# Patient Record
Sex: Female | Born: 1990 | Race: White | Hispanic: No | Marital: Married | Smoking: Former smoker
Health system: Southern US, Community
[De-identification: ages and names within clinical notes are randomized; demographics above are authoritative.]

## PROBLEM LIST (undated history)

## (undated) ENCOUNTER — Inpatient Hospital Stay (HOSPITAL_COMMUNITY): Payer: Self-pay

## (undated) DIAGNOSIS — B379 Candidiasis, unspecified: Secondary | ICD-10-CM

## (undated) DIAGNOSIS — F319 Bipolar disorder, unspecified: Secondary | ICD-10-CM

## (undated) DIAGNOSIS — Z87891 Personal history of nicotine dependence: Secondary | ICD-10-CM

## (undated) DIAGNOSIS — N812 Incomplete uterovaginal prolapse: Secondary | ICD-10-CM

## (undated) DIAGNOSIS — Z3492 Encounter for supervision of normal pregnancy, unspecified, second trimester: Principal | ICD-10-CM

## (undated) DIAGNOSIS — F329 Major depressive disorder, single episode, unspecified: Secondary | ICD-10-CM

## (undated) DIAGNOSIS — N898 Other specified noninflammatory disorders of vagina: Secondary | ICD-10-CM

## (undated) DIAGNOSIS — F419 Anxiety disorder, unspecified: Secondary | ICD-10-CM

## (undated) DIAGNOSIS — D649 Anemia, unspecified: Secondary | ICD-10-CM

## (undated) DIAGNOSIS — F429 Obsessive-compulsive disorder, unspecified: Secondary | ICD-10-CM

## (undated) DIAGNOSIS — F32A Depression, unspecified: Secondary | ICD-10-CM

## (undated) HISTORY — PX: OTHER SURGICAL HISTORY: SHX169

## (undated) HISTORY — PX: DILATION AND CURETTAGE OF UTERUS: SHX78

## (undated) HISTORY — PX: TUBAL LIGATION: SHX77

## (undated) HISTORY — DX: Candidiasis, unspecified: B37.9

## (undated) HISTORY — DX: Encounter for supervision of normal pregnancy, unspecified, second trimester: Z34.92

## (undated) HISTORY — DX: Other specified noninflammatory disorders of vagina: N89.8

## (undated) HISTORY — DX: Incomplete uterovaginal prolapse: N81.2

## (undated) HISTORY — DX: Bipolar disorder, unspecified: F31.9

---

## 2008-08-10 ENCOUNTER — Inpatient Hospital Stay (HOSPITAL_COMMUNITY): Admission: AD | Admit: 2008-08-10 | Discharge: 2008-08-10 | Payer: Self-pay | Admitting: Obstetrics and Gynecology

## 2008-08-12 ENCOUNTER — Inpatient Hospital Stay (HOSPITAL_COMMUNITY): Admission: AD | Admit: 2008-08-12 | Discharge: 2008-08-15 | Payer: Self-pay | Admitting: Obstetrics and Gynecology

## 2008-10-12 ENCOUNTER — Emergency Department (HOSPITAL_COMMUNITY): Admission: EM | Admit: 2008-10-12 | Discharge: 2008-10-12 | Payer: Self-pay | Admitting: Emergency Medicine

## 2008-11-10 ENCOUNTER — Emergency Department (HOSPITAL_COMMUNITY): Admission: EM | Admit: 2008-11-10 | Discharge: 2008-11-10 | Payer: Self-pay | Admitting: Emergency Medicine

## 2009-03-28 ENCOUNTER — Emergency Department (HOSPITAL_COMMUNITY): Admission: EM | Admit: 2009-03-28 | Discharge: 2009-03-28 | Payer: Self-pay | Admitting: Emergency Medicine

## 2010-05-07 ENCOUNTER — Emergency Department (HOSPITAL_COMMUNITY)
Admission: EM | Admit: 2010-05-07 | Discharge: 2010-05-07 | Payer: Self-pay | Source: Home / Self Care | Admitting: Emergency Medicine

## 2011-01-21 LAB — URINALYSIS, ROUTINE W REFLEX MICROSCOPIC
Bilirubin Urine: NEGATIVE
Glucose, UA: NEGATIVE mg/dL
Hgb urine dipstick: NEGATIVE
Ketones, ur: NEGATIVE mg/dL
Nitrite: NEGATIVE
Protein, ur: NEGATIVE mg/dL
Specific Gravity, Urine: 1.02 (ref 1.005–1.030)
Urobilinogen, UA: 0.2 mg/dL (ref 0.0–1.0)
pH: 7.5 (ref 5.0–8.0)

## 2011-01-21 LAB — WET PREP, GENITAL
Clue Cells Wet Prep HPF POC: NONE SEEN
Trich, Wet Prep: NONE SEEN
Yeast Wet Prep HPF POC: NONE SEEN

## 2011-01-21 LAB — POCT PREGNANCY, URINE: Preg Test, Ur: NEGATIVE

## 2011-01-21 LAB — GC/CHLAMYDIA PROBE AMP, GENITAL
Chlamydia, DNA Probe: NEGATIVE
GC Probe Amp, Genital: NEGATIVE

## 2011-02-13 LAB — URINE CULTURE: Colony Count: 90000

## 2011-02-13 LAB — URINALYSIS, ROUTINE W REFLEX MICROSCOPIC
Bilirubin Urine: NEGATIVE
Glucose, UA: NEGATIVE mg/dL
Ketones, ur: NEGATIVE mg/dL
Nitrite: NEGATIVE
Protein, ur: NEGATIVE mg/dL
Specific Gravity, Urine: 1.015 (ref 1.005–1.030)
Urobilinogen, UA: 0.2 mg/dL (ref 0.0–1.0)
pH: 6 (ref 5.0–8.0)

## 2011-02-13 LAB — PREGNANCY, URINE: Preg Test, Ur: NEGATIVE

## 2011-02-13 LAB — URINE MICROSCOPIC-ADD ON

## 2011-02-19 LAB — URINALYSIS, ROUTINE W REFLEX MICROSCOPIC
Bilirubin Urine: NEGATIVE
Glucose, UA: 100 mg/dL — AB
Ketones, ur: NEGATIVE mg/dL
Nitrite: POSITIVE — AB
Protein, ur: 30 mg/dL — AB
Specific Gravity, Urine: 1.025 (ref 1.005–1.030)
Urobilinogen, UA: 2 mg/dL — ABNORMAL HIGH (ref 0.0–1.0)
pH: 6 (ref 5.0–8.0)

## 2011-02-19 LAB — URINE MICROSCOPIC-ADD ON

## 2011-02-19 LAB — URINE CULTURE: Colony Count: >=100000

## 2011-03-28 ENCOUNTER — Emergency Department (HOSPITAL_COMMUNITY)
Admission: EM | Admit: 2011-03-28 | Discharge: 2011-03-28 | Disposition: A | Discharge status: Home / Self Care | Payer: Medicaid Other | Attending: Emergency Medicine | Admitting: Emergency Medicine

## 2011-03-28 DIAGNOSIS — O99891 Other specified diseases and conditions complicating pregnancy: Secondary | ICD-10-CM | POA: Insufficient documentation

## 2011-03-28 DIAGNOSIS — J45909 Unspecified asthma, uncomplicated: Secondary | ICD-10-CM | POA: Insufficient documentation

## 2011-03-28 DIAGNOSIS — IMO0001 Reserved for inherently not codable concepts without codable children: Secondary | ICD-10-CM | POA: Insufficient documentation

## 2011-03-28 DIAGNOSIS — Z79899 Other long term (current) drug therapy: Secondary | ICD-10-CM | POA: Insufficient documentation

## 2011-03-28 DIAGNOSIS — O99019 Anemia complicating pregnancy, unspecified trimester: Secondary | ICD-10-CM | POA: Insufficient documentation

## 2011-03-28 DIAGNOSIS — D649 Anemia, unspecified: Secondary | ICD-10-CM | POA: Insufficient documentation

## 2011-03-28 LAB — COMPREHENSIVE METABOLIC PANEL
ALT: 10 U/L (ref 0–35)
AST: 18 U/L (ref 0–37)
Albumin: 2.7 g/dL — ABNORMAL LOW (ref 3.5–5.2)
Alkaline Phosphatase: 149 U/L — ABNORMAL HIGH (ref 39–117)
BUN: <3 mg/dL — ABNORMAL LOW (ref 6–23)
CO2: 23 mEq/L (ref 19–32)
Calcium: 10.3 mg/dL (ref 8.4–10.5)
Chloride: 104 mEq/L (ref 96–112)
Creatinine, Ser: <0.47 mg/dL (ref 0.4–1.2)
Glucose, Bld: 83 mg/dL (ref 70–99)
Potassium: 3.5 mEq/L (ref 3.5–5.1)
Sodium: 136 mEq/L (ref 135–145)
Total Bilirubin: 0.2 mg/dL — ABNORMAL LOW (ref 0.3–1.2)
Total Protein: 6.4 g/dL (ref 6.0–8.3)

## 2011-03-28 LAB — DIFFERENTIAL
Basophils Absolute: 0 10*3/uL (ref 0.0–0.1)
Basophils Relative: 0 % (ref 0–1)
Eosinophils Absolute: 0.4 10*3/uL (ref 0.0–0.7)
Eosinophils Relative: 4 % (ref 0–5)
Lymphocytes Relative: 25 % (ref 12–46)
Lymphs Abs: 2.4 10*3/uL (ref 0.7–4.0)
Monocytes Absolute: 0.9 10*3/uL (ref 0.1–1.0)
Monocytes Relative: 9 % (ref 3–12)
Neutro Abs: 5.8 10*3/uL (ref 1.7–7.7)
Neutrophils Relative %: 61 % (ref 43–77)

## 2011-03-28 LAB — URINALYSIS, ROUTINE W REFLEX MICROSCOPIC
Bilirubin Urine: NEGATIVE
Glucose, UA: NEGATIVE mg/dL
Ketones, ur: NEGATIVE mg/dL
Leukocytes, UA: NEGATIVE
Nitrite: NEGATIVE
Protein, ur: NEGATIVE mg/dL
Specific Gravity, Urine: 1.01 (ref 1.005–1.030)
Urobilinogen, UA: 0.2 mg/dL (ref 0.0–1.0)
pH: 7 (ref 5.0–8.0)

## 2011-03-28 LAB — CBC
HCT: 30.2 % — ABNORMAL LOW (ref 36.0–46.0)
Hemoglobin: 9.5 g/dL — ABNORMAL LOW (ref 12.0–15.0)
MCH: 25 pg — ABNORMAL LOW (ref 26.0–34.0)
MCHC: 31.5 g/dL (ref 30.0–36.0)
MCV: 79.5 fL (ref 78.0–100.0)
Platelets: 212 10*3/uL (ref 150–400)
RBC: 3.8 MIL/uL — ABNORMAL LOW (ref 3.87–5.11)
RDW: 15.1 % (ref 11.5–15.5)
WBC: 9.6 10*3/uL (ref 4.0–10.5)

## 2011-03-28 LAB — CK: Total CK: 71 U/L (ref 7–177)

## 2011-03-28 LAB — URINE MICROSCOPIC-ADD ON

## 2011-04-15 ENCOUNTER — Inpatient Hospital Stay (HOSPITAL_COMMUNITY)
Admission: AD | Admit: 2011-04-15 | Discharge: 2011-04-15 | Disposition: A | Discharge status: Home / Self Care | Payer: Medicaid Other | Source: Ambulatory Visit | Attending: Family Medicine | Admitting: Family Medicine

## 2011-04-15 ENCOUNTER — Emergency Department (HOSPITAL_COMMUNITY)
Admission: EM | Admit: 2011-04-15 | Discharge: 2011-04-15 | Disposition: A | Discharge status: Other Acute Inpatient Hospital | Payer: Medicaid Other | Source: Home / Self Care | Attending: Emergency Medicine | Admitting: Emergency Medicine

## 2011-04-15 DIAGNOSIS — O479 False labor, unspecified: Secondary | ICD-10-CM

## 2011-04-15 LAB — WET PREP, GENITAL
Clue Cells Wet Prep HPF POC: NONE SEEN
Trich, Wet Prep: NONE SEEN
Yeast Wet Prep HPF POC: NONE SEEN

## 2011-05-26 ENCOUNTER — Emergency Department (HOSPITAL_COMMUNITY)
Admission: EM | Admit: 2011-05-26 | Discharge: 2011-05-26 | Discharge status: Against Medical Advice | Payer: Medicaid Other | Attending: Emergency Medicine | Admitting: Emergency Medicine

## 2011-05-26 ENCOUNTER — Encounter: Payer: Self-pay | Admitting: *Deleted

## 2011-05-26 DIAGNOSIS — N8110 Cystocele, unspecified: Secondary | ICD-10-CM | POA: Insufficient documentation

## 2011-05-26 DIAGNOSIS — O34599 Maternal care for other abnormalities of gravid uterus, unspecified trimester: Secondary | ICD-10-CM | POA: Insufficient documentation

## 2011-05-26 NOTE — ED Notes (Signed)
Pt c/o "cervix" hanging out of vagina; pt states she has had difficulty with the same situation during her pregnancy; pt is 5 weeks postpartum and is c/o pain to vaginal area

## 2011-05-26 NOTE — ED Notes (Signed)
Pt requesting to sign out lwbs,  States she will follow up with pmd or return if worse.

## 2011-08-07 LAB — CBC
HCT: 27.1 — ABNORMAL LOW
HCT: 30.2 — ABNORMAL LOW
Hemoglobin: 10 — ABNORMAL LOW
Hemoglobin: 8.7 — ABNORMAL LOW
MCHC: 32.2
MCHC: 33
MCV: 78.7
MCV: 79
Platelets: 259
Platelets: 279
RBC: 3.44 — ABNORMAL LOW
RBC: 3.83 — ABNORMAL LOW
RDW: 16.3 — ABNORMAL HIGH
RDW: 16.6 — ABNORMAL HIGH
WBC: 12.8 — ABNORMAL HIGH
WBC: 19.5 — ABNORMAL HIGH

## 2011-08-07 LAB — RPR: RPR Ser Ql: NONREACTIVE

## 2011-08-23 ENCOUNTER — Emergency Department (HOSPITAL_COMMUNITY)
Admission: EM | Admit: 2011-08-23 | Discharge: 2011-08-23 | Disposition: A | Discharge status: Home / Self Care | Payer: Self-pay | Attending: Emergency Medicine | Admitting: Emergency Medicine

## 2011-08-23 ENCOUNTER — Encounter (HOSPITAL_COMMUNITY): Payer: Self-pay | Admitting: Emergency Medicine

## 2011-08-23 DIAGNOSIS — M549 Dorsalgia, unspecified: Secondary | ICD-10-CM | POA: Insufficient documentation

## 2011-08-23 DIAGNOSIS — Z87891 Personal history of nicotine dependence: Secondary | ICD-10-CM | POA: Insufficient documentation

## 2011-08-23 DIAGNOSIS — R109 Unspecified abdominal pain: Secondary | ICD-10-CM | POA: Insufficient documentation

## 2011-08-23 DIAGNOSIS — K5900 Constipation, unspecified: Secondary | ICD-10-CM | POA: Insufficient documentation

## 2011-08-23 DIAGNOSIS — R10819 Abdominal tenderness, unspecified site: Secondary | ICD-10-CM | POA: Insufficient documentation

## 2011-08-23 DIAGNOSIS — J45909 Unspecified asthma, uncomplicated: Secondary | ICD-10-CM | POA: Insufficient documentation

## 2011-08-23 MED ORDER — OXYCODONE-ACETAMINOPHEN 5-325 MG PO TABS
1.0000 | ORAL_TABLET | Freq: Once | ORAL | Status: AC
  Administered 2011-08-23: 1 via ORAL
  Filled 2011-08-23: qty 1

## 2011-08-23 MED ORDER — DIAZEPAM 5 MG PO TABS
5.0000 mg | ORAL_TABLET | Freq: Once | ORAL | Status: AC
  Administered 2011-08-23: 5 mg via ORAL
  Filled 2011-08-23: qty 1

## 2011-08-23 MED ORDER — IBUPROFEN 400 MG PO TABS
400.0000 mg | ORAL_TABLET | Freq: Once | ORAL | Status: AC
  Administered 2011-08-23: 400 mg via ORAL
  Filled 2011-08-23: qty 1

## 2011-08-23 NOTE — ED Provider Notes (Signed)
History   This chart was scribed for Raeford Razor, MD by Clarita Crane. The patient was seen in room APA12/APA12 and the patient's care was started at 7:58AM.  CSN: [id number] Arrival date & time: 08/23/2011  7:52 AM   None     Chief Complaint  Patient presents with  . Back Pain   HPI Katie Briggs is a 20 y.o. female who presents to the Emergency Department complaining of constant moderate to severe, non-radaiting back pain described as aching and "twisting" onset this morning upon awaking and persistent since with associated mild abdominal pain and constipation. Patient unable to localize back pain and states "my entire back hurts" and notes that upper back is worse compared to lower. Reports back pain is aggravated with movement and breathing and not relieved with use of Flexeril. States she had 1 episode of similar back pain 1 week ago which resolved after taking Ibuprofen. Denies dysuria, hematuria. Patient denies h/o kidney stones but reports extensive h/o UTIs. Patient is a former smoker.  Past Medical History  Diagnosis Date  . Asthma     History reviewed. No pertinent past surgical history.  History reviewed. No pertinent family history.  History  Substance Use Topics  . Smoking status: Former Games developer  . Smokeless tobacco: Not on file  . Alcohol Use: No    OB History    Grav Para Term Preterm Abortions TAB SAB Ect Mult Living                  Review of Systems 10 Systems reviewed and are negative for acute change except as noted in the HPI.  Allergies  Review of patient's allergies indicates no known allergies.  Home Medications   Current Outpatient Rx  Name Route Sig Dispense Refill  . ALBUTEROL IN Inhalation Inhale into the lungs.      . IBUPROFEN 800 MG PO TABS Oral Take 800 mg by mouth every 8 (eight) hours as needed.        BP 122/53  Pulse 94  Temp(Src) 97.9 F (36.6 C) (Oral)  Resp 24  Ht 5' (1.524 m)  Wt 160 lb (72.576 kg)  BMI 31.25 kg/m2   SpO2 100%  LMP 07/08/2011  Physical Exam  Nursing note and vitals reviewed. Constitutional: She is oriented to person, place, and time. She appears well-developed and well-nourished.       Uncomfortable appearing. Tearful.   HENT:  Head: Normocephalic and atraumatic.  Eyes: Conjunctivae and EOM are normal. Pupils are equal, round, and reactive to light.  Neck: Neck supple. No tracheal deviation present.  Cardiovascular: Normal rate and regular rhythm.   No murmur heard. Pulmonary/Chest: Effort normal. No respiratory distress.  Abdominal: Soft. She exhibits no distension. There is tenderness (diffuse, mild). There is no rebound and no guarding.  Musculoskeletal: Normal range of motion. She exhibits no edema.       Entire spine non-tender.   Neurological: She is alert and oriented to person, place, and time. No cranial nerve deficit or sensory deficit.       Bilateral grip strength normal and equal. Distal sensation intact.    Skin: Skin is warm and dry.  Psychiatric: Her behavior is normal. Her mood appears anxious.    ED Course  Procedures (including critical care time)  DIAGNOSTIC STUDIES: Oxygen Saturation is 100% on room air, normal by my interpretation.    COORDINATION OF CARE:    Labs Reviewed - No data to display No results found.  1. Back pain       MDM  19yF with diffuse atraumatic back pain. Suspect musculoskeletal. Pain does not localize to particular anatomic region. Afebrile. Tearful and anxious, but not toxic. Neuro exam nonfocal. Plan symptomatic tx and re-assessment.      I personally preformed the services scribed in my presence. The recorded information has been reviewed and considered. Raeford Razor, MD.    Raeford Razor, MD 08/23/11 [phone]

## 2011-08-23 NOTE — ED Notes (Signed)
Pt states she woke up this am with her whole back hurting. Pt cannot generalize the pain. Pt denies any injury.

## 2011-08-23 NOTE — ED Notes (Signed)
meds given. Nad. Pt rating pain 10.

## 2011-11-17 ENCOUNTER — Encounter (HOSPITAL_COMMUNITY): Payer: Self-pay

## 2011-11-17 ENCOUNTER — Emergency Department (HOSPITAL_COMMUNITY)
Admission: EM | Admit: 2011-11-17 | Discharge: 2011-11-17 | Disposition: A | Discharge status: Home / Self Care | Payer: Self-pay | Attending: Emergency Medicine | Admitting: Emergency Medicine

## 2011-11-17 DIAGNOSIS — R3915 Urgency of urination: Secondary | ICD-10-CM | POA: Insufficient documentation

## 2011-11-17 DIAGNOSIS — R109 Unspecified abdominal pain: Secondary | ICD-10-CM | POA: Insufficient documentation

## 2011-11-17 DIAGNOSIS — R319 Hematuria, unspecified: Secondary | ICD-10-CM | POA: Insufficient documentation

## 2011-11-17 DIAGNOSIS — N898 Other specified noninflammatory disorders of vagina: Secondary | ICD-10-CM | POA: Insufficient documentation

## 2011-11-17 DIAGNOSIS — J45909 Unspecified asthma, uncomplicated: Secondary | ICD-10-CM | POA: Insufficient documentation

## 2011-11-17 DIAGNOSIS — N390 Urinary tract infection, site not specified: Secondary | ICD-10-CM | POA: Insufficient documentation

## 2011-11-17 DIAGNOSIS — R350 Frequency of micturition: Secondary | ICD-10-CM | POA: Insufficient documentation

## 2011-11-17 DIAGNOSIS — R300 Dysuria: Secondary | ICD-10-CM | POA: Insufficient documentation

## 2011-11-17 LAB — URINALYSIS, ROUTINE W REFLEX MICROSCOPIC
Glucose, UA: 100 mg/dL — AB
Nitrite: POSITIVE — AB
Protein, ur: >300 mg/dL — AB
Specific Gravity, Urine: 1.025 (ref 1.005–1.030)
Urobilinogen, UA: 1 mg/dL (ref 0.0–1.0)
pH: 5.5 (ref 5.0–8.0)

## 2011-11-17 LAB — PREGNANCY, URINE: Preg Test, Ur: NEGATIVE

## 2011-11-17 LAB — URINE MICROSCOPIC-ADD ON

## 2011-11-17 MED ORDER — NITROFURANTOIN MONOHYD MACRO 100 MG PO CAPS: 100.0000 mg | ORAL_CAPSULE | Freq: Two times a day (BID) | ORAL | Status: AC

## 2011-11-17 MED ORDER — PHENAZOPYRIDINE HCL 100 MG PO TABS
200.0000 mg | ORAL_TABLET | Freq: Once | ORAL | Status: AC
  Administered 2011-11-17: 200 mg via ORAL
  Filled 2011-11-17 (×2): qty 1

## 2011-11-17 MED ORDER — FLUCONAZOLE 200 MG PO TABS: 200.0000 mg | ORAL_TABLET | Freq: Every day | ORAL | Status: AC

## 2011-11-17 MED ORDER — PHENAZOPYRIDINE HCL 200 MG PO TABS: 200.0000 mg | ORAL_TABLET | Freq: Three times a day (TID) | ORAL | Status: AC

## 2011-11-17 MED ORDER — NITROFURANTOIN MONOHYD MACRO 100 MG PO CAPS
100.0000 mg | ORAL_CAPSULE | Freq: Once | ORAL | Status: AC
  Administered 2011-11-17: 100 mg via ORAL
  Filled 2011-11-17: qty 1

## 2011-11-17 NOTE — ED Notes (Signed)
Waiting for pharmacy to bring antibiotic

## 2011-11-17 NOTE — ED Provider Notes (Signed)
Medical screening examination/treatment/procedure(s) were conducted as a shared visit with non-physician practitioner(s) and myself.  I personally evaluated the patient during the encounter.  No clinical evidence of pyelonephritis  Donnetta Hutching, MD 11/17/11 1523

## 2011-11-17 NOTE — ED Notes (Signed)
Pt reports woke up this morning with lower abd pain, burning with urination, hematuria, urinary frequency.

## 2011-11-17 NOTE — ED Provider Notes (Signed)
History     CSN: [id number]  Arrival date & time 11/17/11  0820   First MD Initiated Contact with Patient 11/17/11 0827      Chief Complaint  Patient presents with  . Hematuria  . Urinary Tract Infection    (Consider location/radiation/quality/duration/timing/severity/associated sxs/prior treatment) Patient is a 21 y.o. female presenting with hematuria and urinary tract infection. The history is provided by the patient. No language interpreter was used.  Hematuria This is a new problem. The current episode started today. The problem is unchanged. She describes the hematuria as gross hematuria. The hematuria occurs throughout @his @ entire urinary stream.  She reports no clotting in her urine stream. The pain is moderate. She describes her urine color as light pink. Irritative symptoms include frequency and urgency. Associated symptoms include abdominal pain and dysuria. Pertinent negatives include no fever, nausea or vomiting. (Hematuria ) Her past medical history is significant for recent infection. There is no history of kidney stones.  Urinary Tract Infection Associated symptoms include abdominal pain. Pertinent negatives include no fever, nausea or vomiting.    Past Medical History  Diagnosis Date  . Asthma     History reviewed. No pertinent past surgical history.  No family history on file.  History  Substance Use Topics  . Smoking status: Former Games developer  . Smokeless tobacco: Not on file  . Alcohol Use: No    OB History    Grav Para Term Preterm Abortions TAB SAB Ect Mult Living                  Review of Systems  Constitutional: Negative for fever.  Gastrointestinal: Positive for abdominal pain. Negative for nausea and vomiting.  Genitourinary: Positive for dysuria, urgency, frequency and hematuria.  All other systems reviewed and are negative.    Allergies  Review of patient's allergies indicates no known allergies.  Home Medications   Current  Outpatient Rx  Name Route Sig Dispense Refill  . ALBUTEROL SULFATE HFA 108 (90 BASE) MCG/ACT IN AERS Inhalation Inhale 2 puffs into the lungs every 6 (six) hours as needed. Wheezing     . ALBUTEROL IN Inhalation Inhale into the lungs.      . ETONOGESTREL 68 MG [place] IMPL Subcutaneous Inject into the skin.      . IBUPROFEN 800 MG PO TABS Oral Take 800 mg by mouth every 8 (eight) hours as needed.        BP 108/74  Pulse 86  Temp(Src) 98.4 F (36.9 C) (Oral)  Resp 18  Ht 5' (1.524 m)  Wt 155 lb (70.308 kg)  BMI 30.27 kg/m2  SpO2 97%  Physical Exam  Nursing note and vitals reviewed. Constitutional: She is oriented to person, place, and time. She appears well-developed and well-nourished. No distress.  HENT:  Head: Normocephalic and atraumatic.  Eyes: EOM are normal.  Neck: Normal range of motion.  Cardiovascular: Normal rate, regular rhythm and normal heart sounds.   Pulmonary/Chest: Effort normal and breath sounds normal.  Abdominal: Soft. Bowel sounds are normal. She exhibits no distension and no mass. There is tenderness. There is no rebound and no guarding.  Genitourinary: Vaginal discharge found.  Musculoskeletal: Normal range of motion.  Neurological: She is alert and oriented to person, place, and time. No cranial nerve deficit. Coordination normal.  Skin: Skin is warm and dry.  Psychiatric: She has a normal mood and affect. Judgment normal.    ED Course  Procedures (including critical care time)   Labs Reviewed  URINALYSIS, ROUTINE W REFLEX MICROSCOPIC  PREGNANCY, URINE   No results found.   No diagnosis found.    MDM          Worthy Rancher, PA 11/17/11 [phone]

## 2011-11-19 LAB — URINE CULTURE
Colony Count: >=100000
Culture  Setup Time: 201301122037

## 2011-11-21 NOTE — ED Notes (Signed)
+   urine Patient treated with macrobid-sensitive to same-chart appended per protocol MD. 

## 2011-12-17 ENCOUNTER — Emergency Department (HOSPITAL_COMMUNITY)
Admission: EM | Admit: 2011-12-17 | Discharge: 2011-12-17 | Disposition: A | Discharge status: Home / Self Care | Payer: Self-pay | Attending: Emergency Medicine | Admitting: Emergency Medicine

## 2011-12-17 ENCOUNTER — Encounter (HOSPITAL_COMMUNITY): Payer: Self-pay | Admitting: Emergency Medicine

## 2011-12-17 ENCOUNTER — Emergency Department (HOSPITAL_COMMUNITY): Payer: Self-pay

## 2011-12-17 DIAGNOSIS — S93409A Sprain of unspecified ligament of unspecified ankle, initial encounter: Secondary | ICD-10-CM | POA: Insufficient documentation

## 2011-12-17 DIAGNOSIS — J45909 Unspecified asthma, uncomplicated: Secondary | ICD-10-CM | POA: Insufficient documentation

## 2011-12-17 DIAGNOSIS — Z87891 Personal history of nicotine dependence: Secondary | ICD-10-CM | POA: Insufficient documentation

## 2011-12-17 DIAGNOSIS — W108XXA Fall (on) (from) other stairs and steps, initial encounter: Secondary | ICD-10-CM | POA: Insufficient documentation

## 2011-12-17 MED ORDER — IBUPROFEN 800 MG PO TABS: 800.0000 mg | ORAL_TABLET | Freq: Three times a day (TID) | ORAL | Status: AC

## 2011-12-17 MED ORDER — IBUPROFEN 800 MG PO TABS
800.0000 mg | ORAL_TABLET | Freq: Once | ORAL | Status: AC
  Administered 2011-12-17: 800 mg via ORAL
  Filled 2011-12-17: qty 1

## 2011-12-17 MED ORDER — HYDROCODONE-ACETAMINOPHEN 5-325 MG PO TABS: ORAL_TABLET | ORAL | Status: AC

## 2011-12-17 MED ORDER — HYDROCODONE-ACETAMINOPHEN 5-325 MG PO TABS
1.0000 | ORAL_TABLET | Freq: Once | ORAL | Status: AC
  Administered 2011-12-17: 1 via ORAL
  Filled 2011-12-17: qty 1

## 2011-12-17 NOTE — ED Notes (Signed)
Patient states she fell down the stairs and twisted right ankle.

## 2011-12-17 NOTE — Discharge Instructions (Signed)
Ankle Sprain °An ankle sprain is an injury to the ligaments that hold the ankle joint together.  °CAUSES °The injury is usually caused by a fall or by twisting the ankle. It is important to tell your caregiver how the injury occurred and whether or not you were able to walk immediately after the injury.  °SYMPTOMS  °Pain is the primary symptom. It may be present at rest or only when you are trying to stand or walk. The ankle will likely be swollen. Bruising may develop immediately or after 1 or 2 days. It may be difficult or impossible to stand or walk. This depends on the severity of the sprain. °DIAGNOSIS  °Your caregiver can determine if a sprain has occurred based on the accident details and on examination of your ankle. Examination will include pressing and squeezing areas of the foot and ankle. Your caregiver will try to move the ankle in certain ways. X-rays may be used to be sure a bone was not broken, or that the ligament did not pull off of a bone (avulsion). There are standard guidelines that can reliably determine if an X-ray is needed. °TREATMENT  °Rest, ice, elevation, and compression are the basic modes of treatment. Certain types of braces can help stabilize the ankle and allow early return to walking. Your caregiver can make a recommendation for this. Medication may be recommended for pain. You may be referred to an orthopedist or a physical therapist for certain types of severe sprains. °HOME CARE INSTRUCTIONS  °· Apply ice to the sore area for 15 to 20 minutes, 3 to 4 times per day. Do this while you are awake for the first 2 days, or as directed. This can be stopped when the swelling goes away. Put the ice in a plastic bag and place a towel between the bag of ice and your skin.  °· Keep your leg elevated when possible to lessen swelling.  °· If your caregiver recommends crutches, use them as instructed with a non-weight bearing cast for 1 week. Then, you may walk on your ankle as the pain allows,  or as instructed. Gradually, put weight on the affected ankle. Continue to use crutches or a cane until you can walk without causing pain.  °· If a plaster splint was applied, wear the splint until you are seen for a follow-up examination. Rest it on nothing harder than a pillow the first 24 hours. Do not put weight on it. Do not get it wet. You may take it off to take a shower or bath.  °· You may have been given an elastic bandage to use with the plaster splint, or you may have been given a elastic bandage to use alone. The elastic bandage is too tight if you have numbness, tingling, or if your foot becomes cold and blue. Adjust the bandage to make it comfortable.  °· If an air splint was applied, you may blow more air into it or take some out to make it more comfortable. You may take it off at night and to take a shower or bath. Wiggle your toes in the splint several times per day if you are able.  °· Only take over-the-counter or prescription medicines for pain, discomfort, or fever as directed by your caregiver.  °· Do not drive a vehicle until your caregiver specifically tells you it is safe to do so.  °SEEK MEDICAL CARE IF:  °· You have an increase in bruising, swelling, or pain.  °· Your   toes feel cold.  °· Pain relief is not achieved with medications.  °SEEK IMMEDIATE MEDICAL CARE IF: °Your toes are numb or blue or you have severe pain. °MAKE SURE YOU:  °· Understand these instructions.  °· Will watch your condition.  °· Will get help right away if you are not doing well or get worse.  °Document Released: 10/22/2005 Document Revised: 01/26/2011 Document Reviewed: 05/26/2008 °[employer]® Patient Information ©2012 [employer]. °

## 2011-12-17 NOTE — ED Notes (Signed)
Pain ankle and foot.  Fell down steps

## 2011-12-17 NOTE — ED Provider Notes (Signed)
History     CSN: [id number]  Arrival date & time 12/17/11  2113   First MD Initiated Contact with Patient 12/17/11 2127      Chief Complaint  Patient presents with  . Ankle Injury    (Consider location/radiation/quality/duration/timing/severity/associated sxs/prior treatment) HPI Comments: Patient complains of right ankle and foot pain after falling down 4 steps this evening.  She states her ankle twisted. She denies other injuries, neck pain, back pain, or LOC.  She states pain is worse with weightbearing and movement and improves with rest. She states she has not applied ice or taken any medication for pain  Patient is a 21 y.o. female presenting with lower extremity injury. The history is provided by the patient. No language interpreter was used.  Ankle Injury This is a new problem. The current episode started today. The problem occurs constantly. The problem has been unchanged. Associated symptoms include arthralgias. Pertinent negatives include no fever, headaches, joint swelling, neck pain, numbness, rash, vomiting or weakness. The symptoms are aggravated by standing, twisting and walking. She has tried nothing for the symptoms. The treatment provided no relief.    Past Medical History  Diagnosis Date  . Asthma     History reviewed. No pertinent past surgical history.  History reviewed. No pertinent family history.  History  Substance Use Topics  . Smoking status: Former Games developer  . Smokeless tobacco: Not on file  . Alcohol Use: Yes     occ    OB History    Grav Para Term Preterm Abortions TAB SAB Ect Mult Living                  Review of Systems  Constitutional: Negative for fever.  HENT: Negative for neck pain.   Gastrointestinal: Negative for vomiting.  Musculoskeletal: Positive for arthralgias. Negative for joint swelling.  Skin: Negative for rash.  Neurological: Negative for weakness, numbness and headaches.    Allergies  Review of patient's allergies  indicates no known allergies.  Home Medications   Current Outpatient Rx  Name Route Sig Dispense Refill  . ALBUTEROL SULFATE HFA 108 (90 BASE) MCG/ACT IN AERS Inhalation Inhale 2 puffs into the lungs every 6 (six) hours as needed. Wheezing     . ETONOGESTREL 68 MG [place] IMPL Subcutaneous Inject into the skin.        BP 107/93  Pulse 113  Temp(Src) 98.8 F (37.1 C) (Oral)  Resp 20  Ht 5' (1.524 m)  Wt 155 lb (70.308 kg)  BMI 30.27 kg/m2  SpO2 100%  Physical Exam  Nursing note and vitals reviewed. Constitutional: She is oriented to person, place, and time. She appears well-developed and well-nourished. No distress.  HENT:  Head: Normocephalic and atraumatic.  Neck: Normal range of motion. Neck supple.  Cardiovascular: Normal rate, regular rhythm and normal heart sounds.   Pulmonary/Chest: Effort normal and breath sounds normal. No respiratory distress.  Musculoskeletal: Normal range of motion. She exhibits tenderness. She exhibits no edema.       Right ankle: She exhibits normal range of motion, no swelling, no ecchymosis, no deformity, no laceration and normal pulse. tenderness. Lateral malleolus and head of 5th metatarsal tenderness found. No medial malleolus and no proximal fibula tenderness found. Achilles tendon normal.       Feet:  Lymphadenopathy:    She has no cervical adenopathy.  Neurological: She is alert and oriented to person, place, and time. She exhibits normal muscle tone. Coordination normal.  Skin: Skin is warm  and dry.    ED Course  Procedures (including critical care time)  Labs Reviewed - No data to display Dg Ankle Complete Right  12/17/2011  *RADIOLOGY REPORT*  Clinical Data: The ankle injury.  Fall down steps.  Pain.  RIGHT ANKLE - COMPLETE 3+ VIEW  Comparison: None.  Findings: The ankle is located.  No acute bone or soft tissue abnormality is present.  IMPRESSION: Negative right ankle.  Original Report Authenticated By: Jamesetta Orleans. MATTERN, M.D.    Dg Foot Complete Right  12/17/2011  *RADIOLOGY REPORT*  Clinical Data: Fall down steps.  Pain.  RIGHT FOOT COMPLETE - 3+ VIEW  Comparison: None.  Findings: No acute osseous abnormalities are present.  There is a focal calcific density within the soft tissues along the plantar surface of the foot.  The soft tissues are otherwise unremarkable.  IMPRESSION:  1.  No acute fracture. 2.  Calcific density along the plantar surface the foot.  This could represent a chronic foreign body.  Original Report Authenticated By: Jamesetta Orleans. MATTERN, M.D.     ASO and crutches were applied by nursing staff. Pain improved. Patient tolerated procedure well. Remains neurovascularly intact   MDM     Tenderness to palpation of the right lateral ankle without significant soft tissue swelling or bruising.  No proximal fibular tenderness.  DP pulse brisk, sensation intact, cap refill less than 2 seconds.  I will apply ASO splint, dispense crutches the patient agrees to close followup with orthopedics.       Sanyiah Kanzler L. Denis Koppel, [place] 12/18/11 0128

## 2011-12-18 NOTE — ED Provider Notes (Signed)
Medical screening examination/treatment/procedure(s) were performed by non-physician practitioner and as supervising physician I was immediately available for consultation/collaboration.  Nicoletta Dress. Colon Branch, MD 12/18/11 1652

## 2012-03-05 ENCOUNTER — Encounter (HOSPITAL_COMMUNITY): Payer: Self-pay | Admitting: Emergency Medicine

## 2012-03-05 ENCOUNTER — Emergency Department (HOSPITAL_COMMUNITY)
Admission: EM | Admit: 2012-03-05 | Discharge: 2012-03-06 | Disposition: A | Discharge status: Other Acute Inpatient Hospital | Payer: Self-pay | Attending: Emergency Medicine | Admitting: Emergency Medicine

## 2012-03-05 DIAGNOSIS — R45851 Suicidal ideations: Secondary | ICD-10-CM | POA: Insufficient documentation

## 2012-03-05 LAB — COMPREHENSIVE METABOLIC PANEL
ALT: 21 U/L (ref 0–35)
AST: 23 U/L (ref 0–37)
Albumin: 3.5 g/dL (ref 3.5–5.2)
Alkaline Phosphatase: 86 U/L (ref 39–117)
BUN: 9 mg/dL (ref 6–23)
CO2: 27 mEq/L (ref 19–32)
Calcium: 8.9 mg/dL (ref 8.4–10.5)
Chloride: 102 mEq/L (ref 96–112)
Creatinine, Ser: 0.68 mg/dL (ref 0.50–1.10)
GFR calc Af Amer: >90 mL/min (ref >90)
GFR calc non Af Amer: >90 mL/min (ref >90)
Glucose, Bld: 81 mg/dL (ref 70–99)
Potassium: 3.4 mEq/L — ABNORMAL LOW (ref 3.5–5.1)
Sodium: 139 mEq/L (ref 135–145)
Total Bilirubin: 0.3 mg/dL (ref 0.3–1.2)
Total Protein: 6.8 g/dL (ref 6.0–8.3)

## 2012-03-05 LAB — POCT PREGNANCY, URINE: Preg Test, Ur: NEGATIVE

## 2012-03-05 LAB — CBC
HCT: 39.3 % (ref 36.0–46.0)
Hemoglobin: 13.4 g/dL (ref 12.0–15.0)
MCH: 29.5 pg (ref 26.0–34.0)
MCHC: 34.1 g/dL (ref 30.0–36.0)
MCV: 86.4 fL (ref 78.0–100.0)
Platelets: 254 10*3/uL (ref 150–400)
RBC: 4.55 MIL/uL (ref 3.87–5.11)
RDW: 13.7 % (ref 11.5–15.5)
WBC: 12.1 10*3/uL — ABNORMAL HIGH (ref 4.0–10.5)

## 2012-03-05 LAB — RAPID URINE DRUG SCREEN, HOSP PERFORMED
Amphetamines: NOT DETECTED
Barbiturates: NOT DETECTED
Benzodiazepines: NOT DETECTED
Cocaine: NOT DETECTED
Opiates: NOT DETECTED
Tetrahydrocannabinol: NOT DETECTED

## 2012-03-05 LAB — ACETAMINOPHEN LEVEL: Acetaminophen (Tylenol), Serum: <15 ug/mL (ref 10–30)

## 2012-03-05 LAB — ETHANOL: Alcohol, Ethyl (B): <11 mg/dL (ref 0–11)
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
## 2012-03-05 NOTE — ED Notes (Signed)
Dinner tray delivered.
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
## 2012-03-05 NOTE — ED Notes (Signed)
Act team at bedside- Meal tray provided.  SItter remains at door.
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
## 2012-03-05 NOTE — ED Notes (Signed)
Dinner tray ordered, reg nonsharp 

## 2012-03-06 ENCOUNTER — Encounter (HOSPITAL_COMMUNITY): Payer: Self-pay

## 2012-03-06 ENCOUNTER — Inpatient Hospital Stay (HOSPITAL_COMMUNITY)
Admission: AD | Admit: 2012-03-06 | Discharge: 2012-03-10 | DRG: 885 | Disposition: A | Discharge status: Home / Self Care | Payer: 59 | Source: Ambulatory Visit | Attending: Psychiatry | Admitting: Psychiatry

## 2012-03-06 DIAGNOSIS — F429 Obsessive-compulsive disorder, unspecified: Secondary | ICD-10-CM | POA: Diagnosis present

## 2012-03-06 DIAGNOSIS — J45909 Unspecified asthma, uncomplicated: Secondary | ICD-10-CM | POA: Diagnosis present

## 2012-03-06 DIAGNOSIS — F3132 Bipolar disorder, current episode depressed, moderate: Secondary | ICD-10-CM

## 2012-03-06 DIAGNOSIS — F314 Bipolar disorder, current episode depressed, severe, without psychotic features: Principal | ICD-10-CM | POA: Diagnosis present

## 2012-03-06 DIAGNOSIS — R45851 Suicidal ideations: Secondary | ICD-10-CM

## 2012-03-06 HISTORY — DX: Major depressive disorder, single episode, unspecified: F32.9

## 2012-03-06 HISTORY — DX: Depression, unspecified: F32.A

## 2012-03-06 MED ORDER — TRAZODONE HCL 50 MG PO TABS
50.0000 mg | ORAL_TABLET | Freq: Every evening | ORAL | Status: DC | PRN
  Administered 2012-03-06 – 2012-03-09 (×7): 50 mg via ORAL
  Filled 2012-03-06 (×12): qty 1

## 2012-03-06 MED ORDER — ACETAMINOPHEN 325 MG PO TABS
650.0000 mg | ORAL_TABLET | Freq: Four times a day (QID) | ORAL | Status: DC | PRN
  Administered 2012-03-10 (×2): 650 mg via ORAL

## 2012-03-06 MED ORDER — SERTRALINE HCL 50 MG PO TABS
75.0000 mg | ORAL_TABLET | Freq: Every day | ORAL | Status: DC
  Administered 2012-03-06 – 2012-03-10 (×5): 75 mg via ORAL
  Filled 2012-03-06 (×6): qty 1

## 2012-03-06 MED ORDER — MAGNESIUM HYDROXIDE 400 MG/5ML PO SUSP: 30.0000 mL | Freq: Every day | ORAL | Status: DC | PRN

## 2012-03-06 MED ORDER — NICOTINE 21 MG/24HR TD PT24
21.0000 mg | MEDICATED_PATCH | Freq: Every day | TRANSDERMAL | Status: DC
  Filled 2012-03-06: qty 1

## 2012-03-06 MED ORDER — POTASSIUM CHLORIDE CRYS ER 10 MEQ PO TBCR
20.0000 meq | EXTENDED_RELEASE_TABLET | Freq: Two times a day (BID) | ORAL | Status: AC
  Administered 2012-03-06 – 2012-03-08 (×6): 20 meq via ORAL
  Filled 2012-03-06 (×4): qty 1
  Filled 2012-03-06: qty 2
  Filled 2012-03-06: qty 1

## 2012-03-06 MED ORDER — ALUM & MAG HYDROXIDE-SIMETH 200-200-20 MG/5ML PO SUSP: 30.0000 mL | ORAL | Status: DC | PRN

## 2012-03-06 MED ORDER — DIVALPROEX SODIUM ER 500 MG PO TB24
500.0000 mg | ORAL_TABLET | Freq: Two times a day (BID) | ORAL | Status: DC
  Administered 2012-03-06 – 2012-03-07 (×3): 500 mg via ORAL
  Filled 2012-03-06 (×5): qty 1

## 2012-03-06 NOTE — Progress Notes (Signed)
[facility] Group Notes:  (Counselor/Nursing/MHT/Case Management/Adjunct)  03/06/2012 2:35 PM  Type of Therapy:  Group Therapy at 11:00 and 1:15  Participation Level:  Did Not Attend   Katie Briggs 03/06/2012, 2:35 PM

## 2012-03-06 NOTE — Tx Team (Signed)
Initial Interdisciplinary Treatment Plan  PATIENT STRENGTHS: (choose at least two) Ability for insight Average or above average intelligence Capable of independent living Communication skills Financial means Motivation for treatment/growth Physical Health  PATIENT STRESSORS: Loss of son to CPS today* Marital or family conflict Medication change or noncompliance   PROBLEM LIST: Problem List/Patient Goals Date to be addressed Date deferred Reason deferred Estimated date of resolution  Depression/SI 5/2                                                      DISCHARGE CRITERIA:  Ability to meet basic life and health needs Adequate post-discharge living arrangements Improved stabilization in mood, thinking, and/or behavior Verbal commitment to aftercare and medication compliance  PRELIMINARY DISCHARGE PLAN: Outpatient therapy Return to previous living arrangement Return to previous work or school arrangements  PATIENT/FAMIILY INVOLVEMENT: This treatment plan has been presented to and reviewed with the patient, Katie Briggs, and/or family member, .  The patient and family have been given the opportunity to ask questions and make suggestions.  Manuela Schwartz [place] [facility] 03/06/2012, 1:50 AM

## 2012-03-06 NOTE — Progress Notes (Addendum)
Patient ID: Katie Briggs, female   DOB: 1991-08-15, 21 y.o.   MRN: [id number] She has been up for short periods of time. To talk on phone and to eat. Remains in bed remainder of time.  Stated that she was sleepy.  She reports thought of SI  Off and on on her self inventory sheet. Stated that she would not harm herself here.Self inventory: depression 8, hopeless 7.  Reports no physical pain.
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
## 2012-03-06 NOTE — Progress Notes (Signed)
Approached patient twice today to do PSA III; both times unable to rouse patient from sleep.  Did observe patient in line to eat lunch and spoke with her briefly.  In treatment team meeting today there was plan to move patient to [address] hall today once bed becomes available.  Clide Dales 03/06/2012 2:38 PM

## 2012-03-07 DIAGNOSIS — F314 Bipolar disorder, current episode depressed, severe, without psychotic features: Principal | ICD-10-CM

## 2012-03-07 MED ORDER — LITHIUM CARBONATE 300 MG PO CAPS
300.0000 mg | ORAL_CAPSULE | Freq: Two times a day (BID) | ORAL | Status: DC
  Administered 2012-03-07 – 2012-03-10 (×7): 300 mg via ORAL
  Filled 2012-03-07 (×10): qty 1

## 2012-03-07 NOTE — Progress Notes (Signed)
[facility] Group Notes: (Counselor/Nursing/MHT/Case Management/Adjunct) 03/07/2012   @11 :00am Preventing Relapse  Type of Therapy:  Group Therapy  Participation Level:  Active  Participation Quality:  Attentive, Sharing, Appropriate  Affect:  Appropriate  Cognitive:  Appropriate  Insight:  Good  Engagement in Group: Good  Engagement in Therapy:  Good  Modes of Intervention:  Support and Exploration  Summary of Progress/Problems: Katie Briggs processed how serving in the community can be good for recovery. She stated that anything that gets her mind off her own problems and allows her to help others adds meaning to her life. Also explored how being a part of the community fosters connections that can help her not to isolate.  Katie Briggs 03/07/2012 4:09 PM     [facility] Group Notes: (Counselor/Nursing/MHT/Case Management/Adjunct) 03/07/2012   @1 :15pm  Type of Therapy:  Group Therapy  Participation Level:  Good  Participation Quality:  Good  Affect:  Appropriate  Cognitive:  Appropriate  Insight:  Good  Engagement in Group:  Good  Engagement in Therapy:  Good  Modes of Intervention:  Support and Exploration  Summary of Progress/Problems:   Katie Briggs  participated with speaker from [facility] [employer] [facility] [place] and expressed interest in programs [facility] offers. She related to the speaker in many areas and shared her own experience of generational depression. Katie Briggs processed her own upbringing and how it has impacted her present life, and asked questions of the speaker about the causes of mental health concerns.    Katie Briggs 03/07/2012 4:16 PM

## 2012-03-07 NOTE — Tx Team (Signed)
Interdisciplinary Treatment Plan Update (Adult)  Date:  03/07/2012  Time Reviewed:  11:15 AM   Progress in Treatment: Attending groups: Yes Participating in groups:  Yes Taking medication as prescribed: Yes Tolerating medication:  Yes Family/Significant other contact made: No, counselor assessing for appropriate contact  Patient understands diagnosis:  Yes Discussing patient identified problems/goals with staff:  Yes Medical problems stabilized or resolved:  Yes Denies suicidal/homicidal ideation: Yes Issues/concerns per patient self-inventory:  None identified Other: N/A  New problem(s) identified: None Identified  Reason for Continuation of Hospitalization: Anxiety Depression Medication stabilization  Interventions implemented related to continuation of hospitalization: mood stabilization, medication monitoring and adjustment, group therapy and psycho education, safety checks q 15 mins  Additional comments: N/A  Estimated length of stay: 2-3 days  Discharge Plan: Pt will follow up at [facility] for medication management and therapy.    New goal(s): N/A  Review of initial/current patient goals per problem list:    1.  Goal(s): Reduce depressive symptoms  Met:  No  Target date: by discharge  As evidenced by: Reducing depression from a 10 to a 3 as reported by pt.   2.  Goal (s): Reduce/Eliminate suicidal ideation  Met:  No  Target date: by discharge  As evidenced by: pt reporting no SI.    3.  Goal(s): Reduce anxiety symptoms  Met:  No  Target date: by discharge  As evidenced by: Reduce anxiety from a 10 to a 3 as reported by pt.    Attendees: Patient:  Katie Briggs 03/07/2012 11:17 AM   Family:     Physician:  Orson Aloe, MD  03/07/2012  11:15 AM   Nursing:   Lamount Cranker, RN 03/07/2012 11:17 AM   Case Manager:  Reyes Ivan, LCSWA 03/07/2012  11:15 AM   Counselor:  Angus Palms, LCSW 03/07/2012  11:15 AM   Other:  Juline Patch, LCSW 03/07/2012   11:15 AM   Other:   03/07/2012  11:15 AM   Other:     Other:      Scribe for Treatment Team:   Carmina Miller, 03/07/2012 , 11:15 AM

## 2012-03-07 NOTE — Progress Notes (Signed)
Pt attended discharge planning group and actively participated.  Pt presents with calm mood and affect.  Pt ranks depression at a 2 and denies anxiety and SI.  Pt states that she is feeling better from yesterday and doesn't feel as depressed or worthless today.  Pt states that she knows she needs help and it will benefit her family if she got help.  Pt continues to express wanting to d/c home as soon as possible though, but isn't as demanding today.  Pt will follow up at [facility] for medication management and therapy.  No further needs voiced by pt at this time.  Safety planning and suicide prevention discussed.     Reyes Ivan, [place] 03/07/2012  9:37 AM

## 2012-03-07 NOTE — Progress Notes (Signed)
Pt states that she has had depression for a while. She had post partum depression after her first child and it never got better. Social Services came and took her son away from her on Monday and she became suicidal. Pt reports that she "just didn't want to be here anymore". Feels at this point that she is not a good mother.  Reports a History of depression in the family, Grandmother and mother.  Pt stopped taking her Zoloft just because she didn't want to take meds anymore. Is aware now, that she needs to take her meds and is resigned to this. Reports that she was having some mood swings along with her depression and had a very low level of frustration.

## 2012-03-07 NOTE — Progress Notes (Signed)
Pt.in the dayroom with the other pts. Appears socialable and denies feeling Si or HI. Contracts for safety. Pt. Did attend group this pm.
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
## 2012-03-08 NOTE — Progress Notes (Signed)
Patient ID: Katie Briggs, female   DOB: 08/12/1991, 21 y.o.   MRN: [id number] Received order from Dr. Charlann Boxer from [facility], MHT will put cup in room.

## 2012-03-08 NOTE — Progress Notes (Signed)
[facility] Adult Inpatient Family/Significant Other Suicide Prevention Education  Suicide Prevention Education:  Contact Attempts: Cristal Deer [phone]-husband- has been identified by the patient as the family member/significant other with whom the patient will be residing, and identified as the person(s) who will aid the patient in the event of a mental health crisis.  With written consent from the patient, two attempts were made to provide suicide prevention education, prior to and/or following the patient's discharge.  We were unsuccessful in providing suicide prevention education.  A suicide education pamphlet was given to the patient to share with family/significant other.  Date and time of first attempt: by Lamar Blinks on 03/08/12 at 6:06 p.m. Date and time of second attempt:  Neila Gear 03/08/2012, 6:05 PM

## 2012-03-08 NOTE — Progress Notes (Signed)
  NEHA WAIGHT is a 21 y.o. female [id number] 1990-12-11  03/06/2012 Principal Problem:  *Bipolar affective disorder, depressed, severe   Mental Status:    Subjective/Objective:    Filed Vitals:   03/08/12 0855  BP: 126/80  Pulse: 101  Temp:   Resp:     Lab Results:   BMET    Component Value Date/Time   NA 139 03/05/2012 1612   K 3.4* 03/05/2012 1612   CL 102 03/05/2012 1612   CO2 27 03/05/2012 1612   GLUCOSE 81 03/05/2012 1612   BUN 9 03/05/2012 1612   CREATININE 0.68 03/05/2012 1612   CALCIUM 8.9 03/05/2012 1612   GFRNONAA >90 03/05/2012 1612   GFRAA >90 03/05/2012 1612    Medications:  Scheduled:     . lithium carbonate  300 mg Oral BID  . potassium chloride  20 mEq Oral BID  . sertraline  75 mg Oral Daily  . traZODone  50 mg Oral QHS,MR X 1  . DISCONTD: divalproex  500 mg Oral BID     PRN Meds acetaminophen, alum & mag hydroxide-simeth, magnesium hydroxide   Willamae Demby,MICKIE D. 03/08/2012    Natarsha R Kimbell is a 21 y.o. female [id number] 1991-06-03  03/06/2012 Principal Problem:  *Bipolar affective disorder, depressed, severe   Mental Status: Mood is better denies SI/HI/AVH    Subjective/Objective:  Sleeps with music at home and thinks this is the reason she can't get to sleep.   Filed Vitals:   03/08/12 0855  BP: 126/80  Pulse: 101  Temp:   Resp:     Lab Results:   BMET    Component Value Date/Time   NA 139 03/05/2012 1612   K 3.4* 03/05/2012 1612   CL 102 03/05/2012 1612   CO2 27 03/05/2012 1612   GLUCOSE 81 03/05/2012 1612   BUN 9 03/05/2012 1612   CREATININE 0.68 03/05/2012 1612   CALCIUM 8.9 03/05/2012 1612   GFRNONAA >90 03/05/2012 1612   GFRAA >90 03/05/2012 1612    Medications:  Scheduled:     . lithium carbonate  300 mg Oral BID  . potassium chloride  20 mEq Oral BID  . sertraline  75 mg Oral Daily  . traZODone  50 mg Oral QHS,MR X 1  . DISCONTD: divalproex  500 mg Oral BID     PRN Meds acetaminophen, alum & mag hydroxide-simeth,  magnesium hydroxide Plan: will let pt use sleep machine from home.   Jozeph Persing,MICKIE D. 03/08/2012

## 2012-03-08 NOTE — Progress Notes (Signed)
Patient ID: Katie Briggs, female   DOB: 1991/03/01, 21 y.o.   MRN: [id number] Pt. Reports depression at "2" of 10. Pt. Concerns that she is having urinary frequency. Writer plan to call physician an request order for [facility] to determine if she may have UTI. Pt. Made aware. Pt. Denies SHI. Pt. Reports feeling better got to see her little boy and husband today. Pt. Reports she and husband got to talk today and realizes they have to communicate more. Staff will monitor q57min for safety.

## 2012-03-08 NOTE — Progress Notes (Signed)
[facility] Group Notes:  (Counselor/Nursing/MHT/Case Management/Adjunct)  03/08/2012 0830  Type of Therapy:  Discharge Planning  Summary of Progress/Problems: Pt stated that she was admitted to [facility] for SI. Pt states that her depression started after she had her son in 2009. Pt states that her mother and grandmother also have depression. Pt denies SI/HI. Pt lives with her husband and son.    CROSSWELL, DESIREE L 03/08/2012, 2:31 PM

## 2012-03-08 NOTE — Progress Notes (Signed)
[facility] Group Notes:  (Counselor/Nursing/MHT/Case Management/Adjunct)  03/08/2012 1315  Type of Therapy:  Group Therapy  Participation Level:  Did Not Attend  CROSSWELL, DESIREE L 03/08/2012, 5:33 PM

## 2012-03-08 NOTE — H&P (Signed)
Medical/psychiatric screening examination/treatment/procedure(s) were performed by non-physician practitioner and as supervising physician I was immediately available for consultation/collaboration.  I have seen and examined this patient and agree the major elements of this evaluation.  

## 2012-03-08 NOTE — Progress Notes (Signed)
Pt talked about the events of last week prior to coming to the hospital. States that she really has terrible mood swings but is beginning to feel stabilized on her medications. Talked about the relationship that she has with her husband, mother and her children. Is aware that she is part of the problem and can be a part of the solution. Has begun doing homework. Defining what marriage means to her and what she is willing to do to make her relationship work. Also what she needs to do in order to be able to care for her child. Pt reported a sad childhood with having to care for the family because "Mom" was out of the picture and in jail. Given support and reassurance along with praise for her willingness to begin work on her issues.

## 2012-03-08 NOTE — Progress Notes (Signed)
Patient ID: Katie Briggs, female   DOB: 02-Mar-1991, 21 y.o.   MRN: [id number] Pt. In bed, resting, resp. Even, unlabored, no distress noted. Staff will monitor q41min for safety.
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
## 2012-03-09 LAB — URINALYSIS, ROUTINE W REFLEX MICROSCOPIC
Bilirubin Urine: NEGATIVE
Glucose, UA: NEGATIVE mg/dL
Hgb urine dipstick: NEGATIVE
Ketones, ur: NEGATIVE mg/dL
Leukocytes, UA: NEGATIVE
Nitrite: NEGATIVE
Protein, ur: NEGATIVE mg/dL
Specific Gravity, Urine: 1.015 (ref 1.005–1.030)
Urobilinogen, UA: 0.2 mg/dL (ref 0.0–1.0)
pH: 6.5 (ref 5.0–8.0)

## 2012-03-09 LAB — COMPREHENSIVE METABOLIC PANEL
ALT: 17 U/L (ref 0–35)
AST: 20 U/L (ref 0–37)
Albumin: 3.7 g/dL (ref 3.5–5.2)
Alkaline Phosphatase: 85 U/L (ref 39–117)
BUN: 10 mg/dL (ref 6–23)
CO2: 21 mEq/L (ref 19–32)
Calcium: 9.7 mg/dL (ref 8.4–10.5)
Chloride: 102 mEq/L (ref 96–112)
Creatinine, Ser: 0.63 mg/dL (ref 0.50–1.10)
GFR calc Af Amer: >90 mL/min (ref >90)
GFR calc non Af Amer: >90 mL/min (ref >90)
Glucose, Bld: 107 mg/dL — ABNORMAL HIGH (ref 70–99)
Potassium: 4.1 mEq/L (ref 3.5–5.1)
Sodium: 136 mEq/L (ref 135–145)
Total Bilirubin: 0.2 mg/dL — ABNORMAL LOW (ref 0.3–1.2)
Total Protein: 7.4 g/dL (ref 6.0–8.3)

## 2012-03-09 LAB — VALPROIC ACID LEVEL: Valproic Acid Lvl: 12.5 ug/mL — ABNORMAL LOW (ref 50.0–100.0)

## 2012-03-09 LAB — LITHIUM LEVEL: Lithium Lvl: 0.33 mEq/L — ABNORMAL LOW (ref 0.80–1.40)

## 2012-03-09 NOTE — Progress Notes (Signed)
Patient ID: Katie Briggs, female   DOB: 1990-11-16, 21 y.o.   MRN: [id number] The patient is resting in bed with eyes closed. No distress noted. 15 minute checks maintained for safety.

## 2012-03-09 NOTE — Progress Notes (Signed)
[facility] Group Notes:  (Counselor/Nursing/MHT/Case Management/Adjunct)  03/09/2012 0830  Type of Therapy:  Discharge Planning  Summary of Progress/Problems: Pt attended aftercare planning group. Pt denies SI/HI. Pt states that she looks forward to D/C tomorrow 03/10/2012.  CROSSWELL, DESIREE L 03/09/2012, 2:59 PM

## 2012-03-09 NOTE — Progress Notes (Signed)
Pt has been attending the groups and interacting with her peers. Denies SI and HI. Has been wokring on her homework and talking with her husband and trying to communicate appropriately her needs. According to the Pt, Husband is willing to work with her on her issues. Both the husband, mother-in-law and baby son visited last night and Pt was quite happy to see them. States that she feels as though she is getting a new start. Feels as though the medication is working. Given support, reassurance and praise.

## 2012-03-09 NOTE — Progress Notes (Signed)
[facility] Adult Inpatient Family/Significant Other Suicide Prevention Education  Suicide Prevention Education:  Education Completed; Cristal Deer Fonte-[phone]-husband has been identified by the patient as the family member/significant other with whom the patient will be residing, and identified as the person(s) who will aid the patient in the event of a mental health crisis (suicidal ideations/suicide attempt).  With written consent from the patient, the family member/significant other has been provided the following suicide prevention education, prior to the and/or following the discharge of the patient.  The suicide prevention education provided includes the following:  Suicide risk factors  Suicide prevention and interventions  National Suicide Hotline telephone number  [facility] assessment telephone number  [facility] Emergency Assistance 911  [facility] and/or Residential Mobile Crisis Unit telephone number  Request made of family/significant other to:  Remove weapons (e.g., guns, rifles, knives), all items previously/currently identified as safety concern.  Pt.'s husband reports there are no guns in the home.  Remove drugs/medications (over-the-counter, prescriptions, illicit drugs), all items previously/currently identified as a safety concern.Pt.'s husband will lock up and secure medication in home and help the pt. with monitoring it.  Pt.'s husband reports pt.'s SI attempt by overdose 4 years ago. Pt. Husband will lock up and secure the pt.'s medications. Pt.'s husband hand no other questions or concerns and agreed to use the hot line and crisis numbers if needed. Pt. Husband can be reached at the number above.  The family member/significant other verbalizes understanding of the suicide prevention education information provided.  The family member/significant other agrees to remove the items of safety concern listed above.  Neila Gear 03/09/2012, 8:43 AM

## 2012-03-09 NOTE — Progress Notes (Signed)
  Katie Briggs is a 21 y.o. female [id number] 1991-08-11  03/06/2012 Principal Problem:  *Bipolar affective disorder, depressed, severe   Mental Status: Alert & oriented mood and affect have a normal range denies SI/HI/AVH.    Subjective/Objective: Asks when she can be discharged was disappointed that Dep and Li levels were low. Said she slept better last night and didn't have frequency like yesterday.   Filed Vitals:   03/09/12 0754  BP: 100/64  Pulse: 78  Temp:   Resp:     Lab Results:   BMET    Component Value Date/Time   NA 136 03/09/2012 0822   K 4.1 03/09/2012 0822   CL 102 03/09/2012 0822   CO2 21 03/09/2012 0822   GLUCOSE 107* 03/09/2012 0822   BUN 10 03/09/2012 0822   CREATININE 0.63 03/09/2012 0822   CALCIUM 9.7 03/09/2012 0822   GFRNONAA >90 03/09/2012 0822   GFRAA >90 03/09/2012 4098    Medications:  Scheduled:     . lithium carbonate  300 mg Oral BID  . potassium chloride  20 mEq Oral BID  . sertraline  75 mg Oral Daily  . traZODone  50 mg Oral QHS,MR X 1     PRN Meds acetaminophen, alum & mag hydroxide-simeth, magnesium hydroxide  Plan: continue current plan of care. Not reporting symptoms which would trigger med change.   Pauline Trainer,MICKIE D. 03/09/2012

## 2012-03-09 NOTE — Progress Notes (Signed)
[facility] Group Notes:  (Counselor/Nursing/MHT/Case Management/Adjunct)  03/09/2012 1315  Type of Therapy:  Group Therapy  Participation Level:  Active  Participation Quality:  Appropriate  Affect:  Appropriate  Cognitive:  Appropriate  Insight:  Good  Engagement in Group:  Good  Engagement in Therapy:  Good  Modes of Intervention:  Clarification, Problem-solving, Socialization and Support  Summary of Progress/Problems: Pt attended and participated in group on supports. Pt's were asked what they think support means and how they seek support outside of the hospital. Pt states that her pets are a support for her because they show her unconditional support. Pt states that she feels worthless when her supports are not there for her. Pt states that her mother is supportive.   CROSSWELL, DESIREE L 03/09/2012, 3:44 PM

## 2012-03-10 ENCOUNTER — Other Ambulatory Visit (HOSPITAL_COMMUNITY): Payer: Self-pay

## 2012-03-10 DIAGNOSIS — F429 Obsessive-compulsive disorder, unspecified: Secondary | ICD-10-CM

## 2012-03-10 MED ORDER — LITHIUM CARBONATE 300 MG PO CAPS
300.0000 mg | ORAL_CAPSULE | Freq: Every day | ORAL | Status: DC
  Filled 2012-03-10: qty 1

## 2012-03-10 MED ORDER — RISPERIDONE 1 MG PO TABS
1.0000 mg | ORAL_TABLET | Freq: Every day | ORAL | Status: DC
  Filled 2012-03-10: qty 1
  Filled 2012-03-10: qty 14

## 2012-03-10 MED ORDER — RISPERIDONE 1 MG PO TABS: 1.0000 mg | ORAL_TABLET | Freq: Every day | ORAL | Status: DC

## 2012-03-10 MED ORDER — LORATADINE 10 MG PO TABS: 10.0000 mg | ORAL_TABLET | Freq: Every day | ORAL | Status: DC

## 2012-03-10 MED ORDER — SERTRALINE HCL 25 MG PO TABS
75.0000 mg | ORAL_TABLET | Freq: Every day | ORAL | Status: DC
  Filled 2012-03-10: qty 42

## 2012-03-10 MED ORDER — LITHIUM CARBONATE 300 MG PO CAPS: ORAL_CAPSULE | ORAL | Status: DC

## 2012-03-10 MED ORDER — ETONOGESTREL 68 MG ~~LOC~~ IMPL: 1.0000 | DRUG_IMPLANT | Freq: Once | SUBCUTANEOUS | Status: DC

## 2012-03-10 MED ORDER — LITHIUM CARBONATE 300 MG PO CAPS
600.0000 mg | ORAL_CAPSULE | Freq: Every day | ORAL | Status: DC
  Filled 2012-03-10: qty 2

## 2012-03-10 MED ORDER — SERTRALINE HCL 50 MG PO TABS: 75.0000 mg | ORAL_TABLET | Freq: Every day | ORAL | Status: DC

## 2012-03-10 NOTE — Tx Team (Signed)
Interdisciplinary Treatment Plan Update (Adult)  Date:  03/10/2012  Time Reviewed:  10:17 AM   Progress in Treatment: Attending groups: Yes Participating in groups:  Yes Taking medication as prescribed: Yes Tolerating medication:  Yes Family/Significant other contact made: Yes, contact made with her husband  Patient understands diagnosis:  Yes Discussing patient identified problems/goals with staff:  Yes Medical problems stabilized or resolved:  Yes Denies suicidal/homicidal ideation: Yes Issues/concerns per patient self-inventory:  None identified Other: N/A  New problem(s) identified: None Identified  Reason for Continuation of Hospitalization: Stable to d/c  Interventions implemented related to continuation of hospitalization: Stable to d/c  Additional comments: N/A  Estimated length of stay: D/C today  Discharge Plan: Pt will follow up with Arna Medici for medication management and therapy  New goal(s): N/A  Review of initial/current patient goals per problem list:    1.  Goal(s): Reduce depressive symptoms  Met:  Yes  Target date: by discharge  As evidenced by: Reducing depression from a 10 to a 3 as reported by pt. Pt ranks at a 2 today.    2.  Goal (s): Reduce/Eliminate suicidal ideation  Met:  Yes  Target date: by discharge  As evidenced by: pt reporting no SI.    3.  Goal(s): Reduce anxiety symptoms  Met: Yes  Target date: by discharge  As evidenced by: Reduce anxiety from a 10 to a 3 as reported by pt. Pt denies having anxiety.    Attendees: Patient:  Katie Briggs 03/10/2012 10:45 AM  Family:     Physician:  Orson Aloe, MD  03/10/2012  10:17 AM   Nursing:   Rozanna Box, RN 03/10/2012 10:19 AM   Case Manager:  Reyes Ivan, LCSWA 03/10/2012  10:17 AM   Counselor:  Angus Palms, LCSW 03/10/2012  10:17 AM   Other:  Juline Patch, LCSW 03/10/2012  10:17 AM   Other:  Serena Colonel, NP 03/10/2012  10:17 AM   Other:  Berneice Heinrich, RN 03/10/2012 10:19  AM   Other:      Scribe for Treatment Team:   Carmina Miller, 03/10/2012 , 10:17 AM

## 2012-03-10 NOTE — Progress Notes (Signed)
[facility] Group Notes: (Counselor/Nursing/MHT/Case Management/Adjunct) 03/10/2012   @  11:00am Overcoming Obstacles to Wellness   Type of Therapy:  Group Therapy  Participation Level:  Active  Participation Quality:  Attentive, Sharing, Appropriate  Affect:  Appropriate  Cognitive:  Appropriate  Insight:  Good  Engagement in Group: Good  Engagement in Therapy:  Good  Modes of Intervention:  Support and Exploration  Summary of Progress/Problems: Telicia reported that it is hard for her to think of what she is like when she is well because she is not sure she ever has been. She explored how her early life impacted her idea of wellness, and was able to identify that she is not sure what wellness is. However, Laurabeth was able to relate to the basic wellness concepts shared by others and explore daily maintenance tasks to help her gain wellness.  Billie Lade 03/10/2012 4:31 PM     [facility] Group Notes: (Counselor/Nursing/MHT/Case Management/Adjunct) 03/10/2012   @1 :15pm Breathing & Meditation for Anxiety/Anger   Type of Therapy:  Group Therapy  Participation Level:  Active  Participation Quality:  Appropriate   Affect:  Appropraite  Cognitive:  Appropriate  Insight:  Good  Engagement in Group: Good  Engagement in Therapy:  Good  Modes of Intervention:  Support and Exploration  Summary of Progress/Problems: Elliet participated in circle breathing and lovingkindness meditation. She shared her experience with the group and asked for a copy of the CD  Because she believes it can help her with anxiety and anger at home.   Billie Lade 03/10/2012 4:33 PM

## 2012-03-10 NOTE — Progress Notes (Signed)
Denies SI/HI. Eager for discharge and forward thinking with bright positive outlook.

## 2012-03-10 NOTE — Progress Notes (Signed)
[facility] Case Management Discharge Plan:  Will you be returning to the same living situation after discharge: Yes,  return home with family At discharge, do you have transportation home?:Yes,  husband to transport pt home Do you have the ability to pay for your medications:Yes,  access to meds   Release of information consent forms completed and in the chart;  Patient's signature needed at discharge.  Patient to Follow up at:  Follow-up Information    Follow up with Arna Medici on 03/11/2012. (Walk in between 8:30 - 11 am, Auth # [id number])    Contact information:   [address] [place], [place] [address] [phone]         Patient denies SI/HI:   Yes,  denies SI/HI    Safety Planning and Suicide Prevention discussed:  Yes,  discussed with pt  Barrier to discharge identified:No.  Summary and Recommendations: Pt attended discharge planning group and actively participated.  Pt presents with calm mood and affect.  Pt ranks depression at a 2 and denies anxiety and SI today.  Pt reports feeling stable to d/c today.  No recommendations from SW.  No further needs voiced by pt.  Pt stable to discharge.     Carmina Miller 03/10/2012, 10:14 AM

## 2012-03-10 NOTE — Progress Notes (Signed)
Patient presents as calm this AM. States that she is ready for discharge. Eager to see her 43 month old. Reports that her sleep was fair, appetite good, energy level normal and ability to pay attention good. Rates her depression as a 2/10 and her hopelesness 1/10. Denies SI/HI or psychosis. Rates her pain as a 1/10 with a states pain goal of 0. Patient verbalized that she and her husband will need to work on communication. Patient encouraged to review coping skills today in preparation for discharge.

## 2012-03-10 NOTE — Progress Notes (Signed)
Patient ID: Katie Briggs, female   DOB: [birth year], 21 y.o.   MRN: [id number] The patient is brighter, but somewhat anxious. She is hoping to be discharged tomorrow. Stated that she and her husband know that they have to communicate better and they are both willing to work on it. Denies any suicidal ideation. Reports that she scratches herself frequently and thinks it is due to stress.

## 2012-03-10 NOTE — Progress Notes (Signed)
Pt. Denies SI/HI and was discharged from unit by Clinical research associate.  Encouragement and support given.  Pt. Receptive.
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
## 2012-03-10 NOTE — Progress Notes (Signed)
[facility] MD Progress Note  03/10/2012 11:15 AM  Diagnosis:  Axis I: Bipolar, Depressed and Obsessive Compulsive Disorder  ADL's:  Intact  Sleep: Good  Appetite:  Good  Suicidal Ideation:  Denies adamantly any suicidal thoughts. Homicidal Ideation:  Denies adamantly any homicidal thoughts.  Mental Status Examination/Evaluation: Objective:  Appearance: Casual  Eye Contact::  Good  Speech:  Clear and Coherent  Volume:  Normal  Mood:  Euthymic  Affect:  Congruent  Thought Process:  Coherent  Orientation:  Full  Thought Content:  WDL  Suicidal Thoughts:  No  Homicidal Thoughts:  No  Memory:  Immediate;   Good  Judgement:  Good  Insight:  Good  Psychomotor Activity:  Normal  Concentration:  Good  Recall:  Good  Akathisia:  No  AIMS (if indicated):     Assets:  Communication Skills Desire for Improvement  Sleep:  Number of Hours: 6.75    Vital Signs:Blood pressure 107/65, pulse 118, temperature 97.9 F (36.6 C), temperature source Oral, resp. rate 18, height 5\' 1"  (1.549 m), weight 73.483 kg (162 lb). Current Medications: Current Facility-Administered Medications  Medication Dose Route Frequency Provider Last Rate Last Dose  . acetaminophen (TYLENOL) tablet 650 mg  650 mg Oral Q6H PRN Cleotis Nipper, MD   650 mg at 03/10/12 0615  . alum & mag hydroxide-simeth (MAALOX/MYLANTA) 200-200-20 MG/5ML suspension 30 mL  30 mL Oral Q4H PRN Cleotis Nipper, MD      . lithium carbonate capsule 300 mg  300 mg Oral Q breakfast Mike Craze, MD      . lithium carbonate capsule 600 mg  600 mg Oral QHS Mike Craze, MD      . magnesium hydroxide (MILK OF MAGNESIA) suspension 30 mL  30 mL Oral Daily PRN Cleotis Nipper, MD      . sertraline (ZOLOFT) 75 mg  75 mg Oral Daily Sanjuana Kava, NP   75 mg at 03/10/12 0749  . traZODone (DESYREL) tablet 50 mg  50 mg Oral QHS,MR X 1 Cleotis Nipper, MD   50 mg at 03/09/12 2239  . DISCONTD: lithium carbonate capsule 300 mg  300 mg Oral BID Mike Craze, MD    300 mg at 03/10/12 4098    Lab Results:  Results for orders placed during the hospital encounter of 03/06/12 (from the past 48 hour(s))  URINALYSIS, ROUTINE W REFLEX MICROSCOPIC     Status: Normal   Collection Time   03/09/12  6:16 AM      Component Value Range Comment   Color, Urine YELLOW  YELLOW     APPearance CLEAR  CLEAR     Specific Gravity, Urine 1.015  1.005 - 1.030     pH 6.5  5.0 - 8.0     Glucose, UA NEGATIVE  NEGATIVE (mg/dL)    Hgb urine dipstick NEGATIVE  NEGATIVE     Bilirubin Urine NEGATIVE  NEGATIVE     Ketones, ur NEGATIVE  NEGATIVE (mg/dL)    Protein, ur NEGATIVE  NEGATIVE (mg/dL)    Urobilinogen, UA 0.2  0.0 - 1.0 (mg/dL)    Nitrite NEGATIVE  NEGATIVE     Leukocytes, UA NEGATIVE  NEGATIVE  MICROSCOPIC NOT DONE ON URINES WITH NEGATIVE PROTEIN, BLOOD, LEUKOCYTES, NITRITE, OR GLUCOSE <1000 mg/dL.  VALPROIC ACID LEVEL     Status: Abnormal   Collection Time   03/09/12  8:22 AM      Component Value Range Comment   Valproic Acid  Lvl 12.5 (*) 50.0 - 100.0 (ug/mL)   COMPREHENSIVE METABOLIC PANEL     Status: Abnormal   Collection Time   03/09/12  8:22 AM      Component Value Range Comment   Sodium 136  135 - 145 (mEq/L)    Potassium 4.1  3.5 - 5.1 (mEq/L)    Chloride 102  96 - 112 (mEq/L)    CO2 21  19 - 32 (mEq/L)    Glucose, Bld 107 (*) 70 - 99 (mg/dL)    BUN 10  6 - 23 (mg/dL)    Creatinine, Ser 1.61  0.50 - 1.10 (mg/dL)    Calcium 9.7  8.4 - 10.5 (mg/dL)    Total Protein 7.4  6.0 - 8.3 (g/dL)    Albumin 3.7  3.5 - 5.2 (g/dL)    AST 20  0 - 37 (U/L) SLIGHT HEMOLYSIS   ALT 17  0 - 35 (U/L)    Alkaline Phosphatase 85  39 - 117 (U/L)    Total Bilirubin 0.2 (*) 0.3 - 1.2 (mg/dL)    GFR calc non Af Amer >90  >90 (mL/min)    GFR calc Af Amer >90  >90 (mL/min)   LITHIUM LEVEL     Status: Abnormal   Collection Time   03/09/12  8:22 AM      Component Value Range Comment   Lithium Lvl 0.33 (*) 0.80 - 1.40 (mEq/L)     Physical Findings: AIMS:  , ,  ,  ,    CIWA:      COWS:     Treatment Plan Summary: Daily contact with patient to assess and evaluate symptoms and progress in treatment Medication management  Discussion/Plan: Pt was seen in treatment team where it was determined that she was ready for discharge with some good progress in her learning and her insight.  After the team meeting was over she re-entered the room and showed me the skin picking that she has done on her forearms and her lower legs.  It looked like that of people with OCD.  She had several ages of picked ares on her legs.  She noted that when she was just sitting watching TV this weekend she found herself just picking on her skin.  Upon further investigation, she admitted to not being able to let things go with her husband.  She gets stuck on issues and particularly issues where she sees injustice being done. She simply can not let it go.  She was given the diagnosis of OCD.  Will stop the Lithium and switch her to Risperdal.  She does not have insurance to pay for Abilify and Geodon does not seem to work as well for OCD.  Katie Briggs 03/10/2012, 11:15 AM
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
## 2012-03-11 NOTE — Discharge Summary (Signed)
I agree with this D/C Summary.  

## 2012-03-13 NOTE — Progress Notes (Signed)
Patient Discharge Instructions:  After Visit Summary (AVS):   Faxed to:  03/12/2012 Face Sheet:   Faxed to:  03/12/2012 Psychiatric Admission Assessment Note:   Faxed to:  03/12/2012 Suicide Risk Assessment - Discharge Assessment:   Faxed to:  03/12/2012 Faxed/Sent to the Next Level Care provider:  03/12/2012  Faxed to [facility] @ [phone]  Heloise Purpura, Eduard Clos, 03/13/2012, 6:46 PM

## 2012-04-05 ENCOUNTER — Emergency Department (HOSPITAL_COMMUNITY): Payer: Self-pay

## 2012-04-05 ENCOUNTER — Encounter (HOSPITAL_COMMUNITY): Payer: Self-pay | Admitting: *Deleted

## 2012-04-05 ENCOUNTER — Emergency Department (HOSPITAL_COMMUNITY)
Admission: EM | Admit: 2012-04-05 | Discharge: 2012-04-05 | Disposition: A | Discharge status: Home / Self Care | Payer: Self-pay | Attending: Emergency Medicine | Admitting: Emergency Medicine

## 2012-04-05 DIAGNOSIS — J45909 Unspecified asthma, uncomplicated: Secondary | ICD-10-CM | POA: Insufficient documentation

## 2012-04-05 DIAGNOSIS — F329 Major depressive disorder, single episode, unspecified: Secondary | ICD-10-CM | POA: Insufficient documentation

## 2012-04-05 DIAGNOSIS — M549 Dorsalgia, unspecified: Secondary | ICD-10-CM | POA: Insufficient documentation

## 2012-04-05 DIAGNOSIS — F3289 Other specified depressive episodes: Secondary | ICD-10-CM | POA: Insufficient documentation

## 2012-04-05 DIAGNOSIS — Z79899 Other long term (current) drug therapy: Secondary | ICD-10-CM | POA: Insufficient documentation

## 2012-04-05 LAB — DIFFERENTIAL
Basophils Absolute: 0 10*3/uL (ref 0.0–0.1)
Basophils Relative: 0 % (ref 0–1)
Eosinophils Absolute: 0.6 10*3/uL (ref 0.0–0.7)
Eosinophils Relative: 4 % (ref 0–5)
Lymphocytes Relative: 20 % (ref 12–46)
Lymphs Abs: 2.8 10*3/uL (ref 0.7–4.0)
Monocytes Absolute: 0.8 10*3/uL (ref 0.1–1.0)
Monocytes Relative: 6 % (ref 3–12)
Neutro Abs: 9.9 10*3/uL — ABNORMAL HIGH (ref 1.7–7.7)
Neutrophils Relative %: 70 % (ref 43–77)

## 2012-04-05 LAB — URINALYSIS, ROUTINE W REFLEX MICROSCOPIC
Bilirubin Urine: NEGATIVE
Glucose, UA: NEGATIVE mg/dL
Hgb urine dipstick: NEGATIVE
Ketones, ur: NEGATIVE mg/dL
Leukocytes, UA: NEGATIVE
Nitrite: NEGATIVE
Protein, ur: NEGATIVE mg/dL
Specific Gravity, Urine: >1.03 — ABNORMAL HIGH (ref 1.005–1.030)
Urobilinogen, UA: 0.2 mg/dL (ref 0.0–1.0)
pH: 6 (ref 5.0–8.0)

## 2012-04-05 LAB — PREGNANCY, URINE: Preg Test, Ur: NEGATIVE

## 2012-04-05 LAB — BASIC METABOLIC PANEL
BUN: 11 mg/dL (ref 6–23)
CO2: 27 mEq/L (ref 19–32)
Calcium: 9.9 mg/dL (ref 8.4–10.5)
Chloride: 100 mEq/L (ref 96–112)
Creatinine, Ser: 0.64 mg/dL (ref 0.50–1.10)
GFR calc Af Amer: >90 mL/min (ref >90)
GFR calc non Af Amer: >90 mL/min (ref >90)
Glucose, Bld: 112 mg/dL — ABNORMAL HIGH (ref 70–99)
Potassium: 3.4 mEq/L — ABNORMAL LOW (ref 3.5–5.1)
Sodium: 136 mEq/L (ref 135–145)

## 2012-04-05 LAB — CBC
HCT: 41.3 % (ref 36.0–46.0)
Hemoglobin: 14.1 g/dL (ref 12.0–15.0)
MCH: 29.8 pg (ref 26.0–34.0)
MCHC: 34.1 g/dL (ref 30.0–36.0)
MCV: 87.3 fL (ref 78.0–100.0)
Platelets: 223 10*3/uL (ref 150–400)
RBC: 4.73 MIL/uL (ref 3.87–5.11)
RDW: 13.4 % (ref 11.5–15.5)
WBC: 14.2 10*3/uL — ABNORMAL HIGH (ref 4.0–10.5)

## 2012-04-05 MED ORDER — ONDANSETRON HCL 4 MG/2ML IJ SOLN
4.0000 mg | Freq: Once | INTRAMUSCULAR | Status: AC
  Administered 2012-04-05: 4 mg via INTRAVENOUS
  Filled 2012-04-05: qty 2

## 2012-04-05 MED ORDER — HYDROCODONE-ACETAMINOPHEN 5-325 MG PO TABS: 1.0000 | ORAL_TABLET | Freq: Four times a day (QID) | ORAL | Status: AC | PRN

## 2012-04-05 MED ORDER — KETOROLAC TROMETHAMINE 30 MG/ML IJ SOLN
30.0000 mg | Freq: Once | INTRAMUSCULAR | Status: AC
  Administered 2012-04-05: 30 mg via INTRAVENOUS
  Filled 2012-04-05: qty 1

## 2012-04-05 MED ORDER — SODIUM CHLORIDE 0.9 % IV SOLN
Freq: Once | INTRAVENOUS | Status: AC
  Administered 2012-04-05: 06:00:00 via INTRAVENOUS

## 2012-04-05 NOTE — Discharge Instructions (Signed)
Follow up next week with Dr. Renard Matter

## 2012-04-05 NOTE — ED Notes (Signed)
Pt reports she woke up about 4 with severe back pain.  States that pain is in middle of back.  Denies any known injury.  States "It's something inside that hurts", somewhat non-specific about pain location.

## 2012-04-05 NOTE — ED Notes (Signed)
Dr Brooke Dare made aware of BP

## 2012-04-05 NOTE — ED Notes (Signed)
Pt back in room from CT 

## 2012-04-05 NOTE — ED Provider Notes (Signed)
History     CSN: [id number]  Arrival date & time 04/05/12  4098   First MD Initiated Contact with Patient 04/05/12 [phone]      Chief Complaint  Patient presents with  . Back Pain    (Consider location/radiation/quality/duration/timing/severity/associated sxs/prior treatment) Patient is a 21 y.o. female presenting with back pain. The history is provided by the patient (the pt complains of back pain.). No language interpreter was used.  Back Pain  This is a new problem. The current episode started 6 to 12 hours ago. The problem occurs constantly. The problem has not changed since onset.The pain is associated with no known injury. The pain is present in the lumbar spine. The quality of the pain is described as stabbing. The pain does not radiate. The pain is at a severity of 3/10. The pain is moderate. The symptoms are aggravated by bending. The pain is the same all the time. Stiffness is present all day. Pertinent negatives include no chest pain, no headaches and no abdominal pain.    Past Medical History  Diagnosis Date  . Asthma   . Depression     History reviewed. No pertinent past surgical history.  History reviewed. No pertinent family history.  History  Substance Use Topics  . Smoking status: Former Games developer  . Smokeless tobacco: Not on file  . Alcohol Use: Yes     occ    OB History    Grav Para Term Preterm Abortions TAB SAB Ect Mult Living                  Review of Systems  Constitutional: Negative for fatigue.  HENT: Negative for congestion, sinus pressure and ear discharge.   Eyes: Negative for discharge.  Respiratory: Negative for cough.   Cardiovascular: Negative for chest pain.  Gastrointestinal: Negative for abdominal pain and diarrhea.  Genitourinary: Negative for frequency and hematuria.  Musculoskeletal: Positive for back pain.  Skin: Negative for rash.  Neurological: Negative for seizures and headaches.  Hematological: Negative.     Psychiatric/Behavioral: Negative for hallucinations.    Allergies  Review of patient's allergies indicates no known allergies.  Home Medications   Current Outpatient Rx  Name Route Sig Dispense Refill  . ETONOGESTREL 68 MG [place] IMPL Subcutaneous Inject 1 each (68 mg total) into the skin once. For contraception 1 each 0  . HYDROCODONE-ACETAMINOPHEN 5-325 MG PO TABS Oral Take 1 tablet by mouth every 6 (six) hours as needed for pain. 20 tablet 0  . LORATADINE 10 MG PO TABS Oral Take 1 tablet (10 mg total) by mouth daily. For allergies 30 tablet 0  . RISPERIDONE 1 MG PO TABS Oral Take 1 tablet (1 mg total) by mouth at bedtime. STOP LITHIUM, For control of mood and obsessive thinking and compulsive skin picking 30 tablet 0  . SERTRALINE HCL 50 MG PO TABS Oral Take 1.5 tablets (75 mg total) by mouth daily. For depression. 45 tablet 0    BP 102/53  Pulse 75  Temp(Src) 98.1 F (36.7 C) (Oral)  Resp 18  Ht 5' (1.524 m)  Wt 157 lb (71.215 kg)  BMI 30.66 kg/m2  SpO2 98%  Physical Exam  Constitutional: She is oriented to person, place, and time. She appears well-developed.  HENT:  Head: Normocephalic and atraumatic.  Eyes: Conjunctivae and EOM are normal. No scleral icterus.  Neck: Neck supple. No thyromegaly present.  Cardiovascular: Normal rate and regular rhythm.  Exam reveals no gallop and no friction rub.  No murmur heard. Pulmonary/Chest: No stridor. She has no wheezes. She has no rales. She exhibits no tenderness.  Abdominal: She exhibits no distension. There is no tenderness. There is no rebound.  Musculoskeletal: Normal range of motion. She exhibits tenderness.       Tender lumbar spine  Lymphadenopathy:    She has no cervical adenopathy.  Neurological: She is oriented to person, place, and time. She has normal reflexes. Coordination normal.  Skin: No rash noted. No erythema.  Psychiatric: She has a normal mood and affect. Her behavior is normal.    ED Course  Procedures  (including critical care time)  Labs Reviewed  CBC - Abnormal; Notable for the following:    WBC 14.2 (*)    All other components within normal limits  DIFFERENTIAL - Abnormal; Notable for the following:    Neutro Abs 9.9 (*)    All other components within normal limits  BASIC METABOLIC PANEL - Abnormal; Notable for the following:    Potassium 3.4 (*)    Glucose, Bld 112 (*)    All other components within normal limits  URINALYSIS, ROUTINE W REFLEX MICROSCOPIC - Abnormal; Notable for the following:    APPearance HAZY (*)    Specific Gravity, Urine >1.030 (*)    All other components within normal limits  PREGNANCY, URINE   Ct Abdomen Pelvis Wo Contrast  04/05/2012  *RADIOLOGY REPORT*  Clinical Data: Bilateral flank pain  CT ABDOMEN AND PELVIS WITHOUT CONTRAST  Technique:  Multidetector CT imaging of the abdomen and pelvis was performed following the standard protocol without intravenous contrast.  Comparison: None.  Findings: Lung bases are clear.  No pericardial fluid.  No focal hepatic lesion.  Gallbladder is distended to 4.6 cm but within normal limits.  No radio-opaque gallstones.  Common bile duct appears normal diameter.  The pancreas, spleen, and adrenal glands are normal.  No nephrolithiasis or ureterolithiasis.  The stomach, small bowel, appendix, and cecum are normal.  Colon and rectosigmoid colon are normal.  Abdominal aorta is normal caliber.  No retroperitoneal periportal lymphadenopathy.  No free fluid the pelvis.  The uterus and ovaries appear normal.  Bladder is collapsed.  No pelvic lymphadenopathy. Review of  bone windows demonstrates no aggressive osseous lesions.  IMPRESSION:  1.  No nephrolithiasis or ureterolithiasis. 2.  Normal appendix.  Original Report Authenticated By: Genevive Bi, M.D.     1. Back pain       MDM          Benny Lennert, MD 04/05/12 [phone]

## 2012-04-05 NOTE — ED Notes (Signed)
Discharge instructions reviewed with pt, questions answered. Pt verbalized understanding.  

## 2012-04-05 NOTE — ED Notes (Signed)
MD at bedside. 

## 2012-06-26 ENCOUNTER — Encounter (HOSPITAL_COMMUNITY): Payer: Self-pay | Admitting: *Deleted

## 2012-06-26 ENCOUNTER — Emergency Department (HOSPITAL_COMMUNITY): Payer: Self-pay

## 2012-06-26 ENCOUNTER — Emergency Department (HOSPITAL_COMMUNITY)
Admission: EM | Admit: 2012-06-26 | Discharge: 2012-06-26 | Disposition: A | Discharge status: Home / Self Care | Payer: Self-pay | Attending: Emergency Medicine | Admitting: Emergency Medicine

## 2012-06-26 DIAGNOSIS — R197 Diarrhea, unspecified: Secondary | ICD-10-CM | POA: Insufficient documentation

## 2012-06-26 DIAGNOSIS — K5289 Other specified noninfective gastroenteritis and colitis: Secondary | ICD-10-CM | POA: Insufficient documentation

## 2012-06-26 DIAGNOSIS — J45909 Unspecified asthma, uncomplicated: Secondary | ICD-10-CM | POA: Insufficient documentation

## 2012-06-26 DIAGNOSIS — R109 Unspecified abdominal pain: Secondary | ICD-10-CM

## 2012-06-26 DIAGNOSIS — F3289 Other specified depressive episodes: Secondary | ICD-10-CM | POA: Insufficient documentation

## 2012-06-26 DIAGNOSIS — K529 Noninfective gastroenteritis and colitis, unspecified: Secondary | ICD-10-CM

## 2012-06-26 DIAGNOSIS — F329 Major depressive disorder, single episode, unspecified: Secondary | ICD-10-CM | POA: Insufficient documentation

## 2012-06-26 LAB — URINALYSIS, ROUTINE W REFLEX MICROSCOPIC
Bilirubin Urine: NEGATIVE
Glucose, UA: NEGATIVE mg/dL
Hgb urine dipstick: NEGATIVE
Ketones, ur: NEGATIVE mg/dL
Leukocytes, UA: NEGATIVE
Nitrite: NEGATIVE
Protein, ur: NEGATIVE mg/dL
Specific Gravity, Urine: 1.025 (ref 1.005–1.030)
Urobilinogen, UA: 0.2 mg/dL (ref 0.0–1.0)
pH: 6 (ref 5.0–8.0)

## 2012-06-26 LAB — CBC WITH DIFFERENTIAL/PLATELET
Basophils Absolute: 0 10*3/uL (ref 0.0–0.1)
Basophils Relative: 0 % (ref 0–1)
Eosinophils Absolute: 0.2 10*3/uL (ref 0.0–0.7)
Eosinophils Relative: 2 % (ref 0–5)
HCT: 38.9 % (ref 36.0–46.0)
Hemoglobin: 13.3 g/dL (ref 12.0–15.0)
Lymphocytes Relative: 10 % — ABNORMAL LOW (ref 12–46)
Lymphs Abs: 1.2 10*3/uL (ref 0.7–4.0)
MCH: 30.1 pg (ref 26.0–34.0)
MCHC: 34.2 g/dL (ref 30.0–36.0)
MCV: 88 fL (ref 78.0–100.0)
Monocytes Absolute: 0.8 10*3/uL (ref 0.1–1.0)
Monocytes Relative: 7 % (ref 3–12)
Neutro Abs: 9.9 10*3/uL — ABNORMAL HIGH (ref 1.7–7.7)
Neutrophils Relative %: 81 % — ABNORMAL HIGH (ref 43–77)
Platelets: 240 10*3/uL (ref 150–400)
RBC: 4.42 MIL/uL (ref 3.87–5.11)
RDW: 13.6 % (ref 11.5–15.5)
WBC: 12.2 10*3/uL — ABNORMAL HIGH (ref 4.0–10.5)

## 2012-06-26 LAB — BASIC METABOLIC PANEL
BUN: 11 mg/dL (ref 6–23)
CO2: 24 mEq/L (ref 19–32)
Calcium: 9.3 mg/dL (ref 8.4–10.5)
Chloride: 102 mEq/L (ref 96–112)
Creatinine, Ser: 0.67 mg/dL (ref 0.50–1.10)
GFR calc Af Amer: >90 mL/min (ref >90)
GFR calc non Af Amer: >90 mL/min (ref >90)
Glucose, Bld: 121 mg/dL — ABNORMAL HIGH (ref 70–99)
Potassium: 3.6 mEq/L (ref 3.5–5.1)
Sodium: 136 mEq/L (ref 135–145)

## 2012-06-26 LAB — PREGNANCY, URINE: Preg Test, Ur: NEGATIVE

## 2012-06-26 MED ORDER — IOHEXOL 300 MG/ML  SOLN
100.0000 mL | Freq: Once | INTRAMUSCULAR | Status: AC | PRN
  Administered 2012-06-26: 100 mL via INTRAVENOUS

## 2012-06-26 MED ORDER — ONDANSETRON HCL 4 MG/2ML IJ SOLN
4.0000 mg | Freq: Once | INTRAMUSCULAR | Status: AC
  Administered 2012-06-26: 4 mg via INTRAVENOUS
  Filled 2012-06-26: qty 2

## 2012-06-26 MED ORDER — ONDANSETRON HCL 4 MG/2ML IJ SOLN: 4.0000 mg | Freq: Once | INTRAMUSCULAR | Status: DC

## 2012-06-26 MED ORDER — PROMETHAZINE HCL 25 MG PO TABS: 25.0000 mg | ORAL_TABLET | Freq: Four times a day (QID) | ORAL | Status: DC | PRN

## 2012-06-26 MED ORDER — HYDROCODONE-ACETAMINOPHEN 5-325 MG PO TABS: 1.0000 | ORAL_TABLET | Freq: Four times a day (QID) | ORAL | Status: AC | PRN

## 2012-06-26 MED ORDER — SODIUM CHLORIDE 0.9 % IV BOLUS (SEPSIS)
250.0000 mL | Freq: Once | INTRAVENOUS | Status: AC
  Administered 2012-06-26: 19:00:00 via INTRAVENOUS

## 2012-06-26 MED ORDER — SODIUM CHLORIDE 0.9 % IV SOLN
INTRAVENOUS | Status: DC
  Administered 2012-06-26: 100 mL/h via INTRAVENOUS

## 2012-06-26 MED ORDER — HYDROMORPHONE HCL PF 1 MG/ML IJ SOLN
1.0000 mg | Freq: Once | INTRAMUSCULAR | Status: AC
  Administered 2012-06-26: 1 mg via INTRAVENOUS
  Filled 2012-06-26: qty 1

## 2012-06-26 MED ORDER — LOPERAMIDE HCL 2 MG PO TABS: 2.0000 mg | ORAL_TABLET | Freq: Four times a day (QID) | ORAL | Status: AC | PRN

## 2012-06-26 MED ORDER — HYDROMORPHONE HCL PF 1 MG/ML IJ SOLN: 1.0000 mg | Freq: Once | INTRAMUSCULAR | Status: DC

## 2012-06-26 NOTE — ED Notes (Signed)
C/o lower abd cramping, lower back pain and diarrhea sudden onset today at 1715 while at work.  Denies vomiting, but c/o nausea.

## 2012-06-26 NOTE — ED Provider Notes (Signed)
History     CSN: [id number]  Arrival date & time 06/26/12  4098   First MD Initiated Contact with Patient 06/26/12 1847      Chief Complaint  Patient presents with  . Diarrhea  . Abdominal Pain    (Consider location/radiation/quality/duration/timing/severity/associated sxs/prior treatment) Patient is a 21 y.o. female presenting with diarrhea and abdominal pain. The history is provided by the patient.  Diarrhea The primary symptoms include abdominal pain, nausea and diarrhea. Primary symptoms do not include fever, vomiting, melena, hematochezia, dysuria or rash. The illness began today (Onset at 5 PM). The onset was sudden.  The abdominal pain began today. The abdominal pain has been unchanged since its onset. The abdominal pain is located in the RLQ and LLQ. The abdominal pain does not radiate. The severity of the abdominal pain is 6/10.  Nausea began today.  The illness does not include back pain.  Abdominal Pain The primary symptoms of the illness include abdominal pain, nausea and diarrhea. The primary symptoms of the illness do not include fever, shortness of breath, vomiting, hematochezia, dysuria, vaginal discharge or vaginal bleeding.  Symptoms associated with the illness do not include back pain.    Past Medical History  Diagnosis Date  . Asthma   . Depression     History reviewed. No pertinent past surgical history.  History reviewed. No pertinent family history.  History  Substance Use Topics  . Smoking status: Former Games developer  . Smokeless tobacco: Not on file  . Alcohol Use: Yes     occ    OB History    Grav Para Term Preterm Abortions TAB SAB Ect Mult Living                  Review of Systems  Constitutional: Negative for fever.  HENT: Negative for neck pain.   Eyes: Negative for redness.  Respiratory: Negative for cough and shortness of breath.   Cardiovascular: Negative for chest pain and leg swelling.  Gastrointestinal: Positive for nausea,  abdominal pain and diarrhea. Negative for vomiting, melena and hematochezia.  Genitourinary: Negative for dysuria, vaginal bleeding and vaginal discharge.  Musculoskeletal: Negative for back pain.  Skin: Negative for rash.  Neurological: Negative for headaches.  Hematological: Does not bruise/bleed easily.    Allergies  Review of patient's allergies indicates no known allergies.  Home Medications   Current Outpatient Rx  Name Route Sig Dispense Refill  . ALBUTEROL SULFATE HFA 108 (90 BASE) MCG/ACT IN AERS Inhalation Inhale 2 puffs into the lungs every 6 (six) hours as needed.    Marland Kitchen RISPERIDONE 1 MG PO TABS Oral Take 1 tablet (1 mg total) by mouth at bedtime. STOP LITHIUM, For control of mood and obsessive thinking and compulsive skin picking 30 tablet 0  . SERTRALINE HCL 100 MG PO TABS Oral Take 100 mg by mouth at bedtime.    . TRAZODONE HCL 100 MG PO TABS Oral Take 50 mg by mouth at bedtime.    . ETONOGESTREL 68 MG [place] IMPL Subcutaneous Inject 1 each (68 mg total) into the skin once. For contraception 1 each 0  . HYDROCODONE-ACETAMINOPHEN 5-325 MG PO TABS Oral Take 1-2 tablets by mouth every 6 (six) hours as needed for pain. 10 tablet 0  . LOPERAMIDE HCL 2 MG PO TABS Oral Take 1 tablet (2 mg total) by mouth 4 (four) times daily as needed for diarrhea or loose stools. 30 tablet 0  . PROMETHAZINE HCL 25 MG PO TABS Oral Take 1 tablet (  25 mg total) by mouth every 6 (six) hours as needed for nausea. 12 tablet 0    BP 93/60  Pulse 80  Temp 98.2 F (36.8 C) (Oral)  Resp 18  Ht 5\' 1"  (1.549 m)  Wt 160 lb (72.576 kg)  BMI 30.23 kg/m2  SpO2 99%  Physical Exam  Nursing note and vitals reviewed. Constitutional: She is oriented to person, place, and time. She appears well-developed and well-nourished. No distress.  HENT:  Head: Normocephalic and atraumatic.  Mouth/Throat: Oropharynx is clear and moist.  Eyes: Conjunctivae and EOM are normal. Pupils are equal, round, and reactive to light.    Neck: Normal range of motion. Neck supple.  Cardiovascular: Normal rate, regular rhythm and normal heart sounds.   No murmur heard. Pulmonary/Chest: Effort normal and breath sounds normal.  Abdominal: Soft. Bowel sounds are normal. There is no tenderness.  Musculoskeletal: Normal range of motion. She exhibits no edema.  Neurological: She is alert and oriented to person, place, and time. No cranial nerve deficit. She exhibits normal muscle tone. Coordination normal.  Skin: Skin is warm. No rash noted.    ED Course  Procedures (including critical care time)  Labs Reviewed  CBC WITH DIFFERENTIAL - Abnormal; Notable for the following:    WBC 12.2 (*)     Neutrophils Relative 81 (*)     Neutro Abs 9.9 (*)     Lymphocytes Relative 10 (*)     All other components within normal limits  BASIC METABOLIC PANEL - Abnormal; Notable for the following:    Glucose, Bld 121 (*)     All other components within normal limits  URINALYSIS, ROUTINE W REFLEX MICROSCOPIC  PREGNANCY, URINE   Ct Abdomen Pelvis W Contrast  06/26/2012  *RADIOLOGY REPORT*  Clinical Data: Abdominal pain with nausea vomiting and diarrhea.  CT ABDOMEN AND PELVIS WITH CONTRAST  Technique:  Multidetector CT imaging of the abdomen and pelvis was performed following the standard protocol during bolus administration of intravenous contrast.  Contrast: OMNIPAQUE IOHEXOL 300 MG/ML  SOLN  Comparison: CT 04/05/2012  Findings: Lung bases are clear.  Liver spleen pancreas and kidneys are normal.  Small calcified gallstone without gallbladder thickening.  Negative for bowel obstruction.  The appendix is normal.  No bowel thickening is present.  No free fluid.  Negative for mass or adenopathy.  IMPRESSION: Small gallstone.  Otherwise negative   Original Report Authenticated By: Camelia Phenes, M.D.    Results for orders placed during the hospital encounter of 06/26/12  URINALYSIS, ROUTINE W REFLEX MICROSCOPIC      Component Value Range    Color, Urine YELLOW  YELLOW   APPearance CLEAR  CLEAR   Specific Gravity, Urine 1.025  1.005 - 1.030   pH 6.0  5.0 - 8.0   Glucose, UA NEGATIVE  NEGATIVE mg/dL   Hgb urine dipstick NEGATIVE  NEGATIVE   Bilirubin Urine NEGATIVE  NEGATIVE   Ketones, ur NEGATIVE  NEGATIVE mg/dL   Protein, ur NEGATIVE  NEGATIVE mg/dL   Urobilinogen, UA 0.2  0.0 - 1.0 mg/dL   Nitrite NEGATIVE  NEGATIVE   Leukocytes, UA NEGATIVE  NEGATIVE  PREGNANCY, URINE      Component Value Range   Preg Test, Ur NEGATIVE  NEGATIVE  CBC WITH DIFFERENTIAL      Component Value Range   WBC 12.2 (*) 4.0 - 10.5 K/uL   RBC 4.42  3.87 - 5.11 MIL/uL   Hemoglobin 13.3  12.0 - 15.0 g/dL  HCT 38.9  36.0 - 46.0 %   MCV 88.0  78.0 - 100.0 fL   MCH 30.1  26.0 - 34.0 pg   MCHC 34.2  30.0 - 36.0 g/dL   RDW 91.4  78.2 - 95.6 %   Platelets 240  150 - 400 K/uL   Neutrophils Relative 81 (*) 43 - 77 %   Neutro Abs 9.9 (*) 1.7 - 7.7 K/uL   Lymphocytes Relative 10 (*) 12 - 46 %   Lymphs Abs 1.2  0.7 - 4.0 K/uL   Monocytes Relative 7  3 - 12 %   Monocytes Absolute 0.8  0.1 - 1.0 K/uL   Eosinophils Relative 2  0 - 5 %   Eosinophils Absolute 0.2  0.0 - 0.7 K/uL   Basophils Relative 0  0 - 1 %   Basophils Absolute 0.0  0.0 - 0.1 K/uL  BASIC METABOLIC PANEL      Component Value Range   Sodium 136  135 - 145 mEq/L   Potassium 3.6  3.5 - 5.1 mEq/L   Chloride 102  96 - 112 mEq/L   CO2 24  19 - 32 mEq/L   Glucose, Bld 121 (*) 70 - 99 mg/dL   BUN 11  6 - 23 mg/dL   Creatinine, Ser 2.13  0.50 - 1.10 mg/dL   Calcium 9.3  8.4 - 08.6 mg/dL   GFR calc non Af Amer >90  >90 mL/min   GFR calc Af Amer >90  >90 mL/min     1. Abdominal pain   2. Gastroenteritis       MDM  Workup without specific findings. Patient does have a single gallstone in her gallbladder but her pain complaint is in the upper quadrants at all more lower quadrants. No evidence of pregnancy no evidence urinary tract infection mild leukocytosis electrolytes without  sniffing abnormalities symptoms since of acute onset around 5 this evening may be related to a gastroenteritis. Will treat with pain medicine and antinausea medicine and diarrhea medicine. Patient return for any new or worse symptoms. CT showed no acute findings in the abdomen.        Shelda Jakes, MD 06/26/12 [phone]

## 2012-06-26 NOTE — ED Notes (Signed)
Pt c/l back pain, abdominal cramping, nausea, vomiting and diarrhea since 5:15.

## 2012-06-26 NOTE — ED Notes (Signed)
Left in c/o mother in law for transport home; denies pain; denies nausea; instructions/prescriptions reviewed and f/u information provided; verbalizes understanding.

## 2012-09-16 ENCOUNTER — Encounter (HOSPITAL_COMMUNITY): Payer: Self-pay | Admitting: *Deleted

## 2012-09-16 ENCOUNTER — Emergency Department (HOSPITAL_COMMUNITY)
Admission: EM | Admit: 2012-09-16 | Discharge: 2012-09-17 | Disposition: A | Discharge status: Home / Self Care | Payer: Self-pay | Attending: Emergency Medicine | Admitting: Emergency Medicine

## 2012-09-16 DIAGNOSIS — B9689 Other specified bacterial agents as the cause of diseases classified elsewhere: Secondary | ICD-10-CM

## 2012-09-16 DIAGNOSIS — N949 Unspecified condition associated with female genital organs and menstrual cycle: Secondary | ICD-10-CM | POA: Insufficient documentation

## 2012-09-16 DIAGNOSIS — F429 Obsessive-compulsive disorder, unspecified: Secondary | ICD-10-CM | POA: Insufficient documentation

## 2012-09-16 DIAGNOSIS — F329 Major depressive disorder, single episode, unspecified: Secondary | ICD-10-CM | POA: Insufficient documentation

## 2012-09-16 DIAGNOSIS — Z79899 Other long term (current) drug therapy: Secondary | ICD-10-CM | POA: Insufficient documentation

## 2012-09-16 DIAGNOSIS — F3289 Other specified depressive episodes: Secondary | ICD-10-CM | POA: Insufficient documentation

## 2012-09-16 DIAGNOSIS — L089 Local infection of the skin and subcutaneous tissue, unspecified: Secondary | ICD-10-CM | POA: Insufficient documentation

## 2012-09-16 DIAGNOSIS — Z87891 Personal history of nicotine dependence: Secondary | ICD-10-CM | POA: Insufficient documentation

## 2012-09-16 DIAGNOSIS — J45909 Unspecified asthma, uncomplicated: Secondary | ICD-10-CM | POA: Insufficient documentation

## 2012-09-16 DIAGNOSIS — R062 Wheezing: Secondary | ICD-10-CM | POA: Insufficient documentation

## 2012-09-16 DIAGNOSIS — N760 Acute vaginitis: Secondary | ICD-10-CM | POA: Insufficient documentation

## 2012-09-16 HISTORY — DX: Obsessive-compulsive disorder, unspecified: F42.9

## 2012-09-16 NOTE — ED Provider Notes (Signed)
History     CSN: [id number]  Arrival date & time 09/16/12  2314   First MD Initiated Contact with Patient 09/16/12 2328      Chief Complaint  Patient presents with  . Groin Pain    (Consider location/radiation/quality/duration/timing/severity/associated sxs/prior treatment) HPI Comments: Pt reports over 2 months of groin area pain. The pain is described as a stabbing type pain that last from 1 hour to over night. No related injury or trauma. No unusual discharge or vaginal bleeding.  Patient is a 21 y.o. female presenting with groin pain. The history is provided by the patient.  Groin Pain This is a recurrent problem. The current episode started more than 1 month ago. The problem occurs intermittently. The problem has been gradually worsening. Pertinent negatives include no abdominal pain, arthralgias, chest pain, coughing or neck pain. Nothing aggravates the symptoms. She has tried nothing for the symptoms. The treatment provided no relief.    Past Medical History  Diagnosis Date  . Asthma   . Depression   . OCD (obsessive compulsive disorder)     History reviewed. No pertinent past surgical history.  No family history on file.  History  Substance Use Topics  . Smoking status: Former Games developer  . Smokeless tobacco: Not on file  . Alcohol Use: No    OB History    Grav Para Term Preterm Abortions TAB SAB Ect Mult Living                  Review of Systems  Constitutional: Negative for activity change.       All ROS Neg except as noted in HPI  HENT: Negative for nosebleeds and neck pain.   Eyes: Negative for photophobia and discharge.  Respiratory: Positive for wheezing. Negative for cough and shortness of breath.   Cardiovascular: Negative for chest pain and palpitations.  Gastrointestinal: Negative for abdominal pain and blood in stool.  Genitourinary: Negative for dysuria, frequency and hematuria.       Groin pain  Musculoskeletal: Negative for back pain and  arthralgias.  Skin: Negative.   Neurological: Negative for dizziness, seizures and speech difficulty.  Psychiatric/Behavioral: Negative for hallucinations and confusion.       Depression    Allergies  Review of patient's allergies indicates no known allergies.  Home Medications   Current Outpatient Rx  Name  Route  Sig  Dispense  Refill  . ETONOGESTREL 68 MG [place] IMPL   Subcutaneous   Inject 1 each (68 mg total) into the skin once. For contraception   1 each   0   . RISPERIDONE 1 MG PO TABS   Oral   Take 1 tablet (1 mg total) by mouth at bedtime. STOP LITHIUM, For control of mood and obsessive thinking and compulsive skin picking   30 tablet   0   . SERTRALINE HCL 100 MG PO TABS   Oral   Take 100 mg by mouth at bedtime.         . TRAZODONE HCL 100 MG PO TABS   Oral   Take 50 mg by mouth at bedtime.         . ALBUTEROL SULFATE HFA 108 (90 BASE) MCG/ACT IN AERS   Inhalation   Inhale 2 puffs into the lungs every 6 (six) hours as needed.         Marland Kitchen PROMETHAZINE HCL 25 MG PO TABS   Oral   Take 1 tablet (25 mg total) by mouth every 6 (six)  hours as needed for nausea.   12 tablet   0     BP 125/85  Pulse 75  Temp 98.3 F (36.8 C) (Oral)  Resp 16  Ht 5\' 3"  (1.6 m)  Wt 170 lb (77.111 kg)  BMI 30.11 kg/m2  SpO2 99%  LMP 09/11/2012  Physical Exam  Nursing note and vitals reviewed. Constitutional: She is oriented to person, place, and time. She appears well-developed and well-nourished.  Non-toxic appearance.  HENT:  Head: Normocephalic.  Right Ear: Tympanic membrane and external ear normal.  Left Ear: Tympanic membrane and external ear normal.  Eyes: EOM and lids are normal. Pupils are equal, round, and reactive to light.  Neck: Normal range of motion. Neck supple. Carotid bruit is not present.  Cardiovascular: Normal rate, regular rhythm, normal heart sounds, intact distal pulses and normal pulses.   Pulmonary/Chest: Breath sounds normal. No respiratory  distress.  Abdominal: Soft. Bowel sounds are normal. She exhibits no mass. There is no tenderness. There is no guarding.  Genitourinary:       Increase redness of the suprapubic area and pubis. Pain to palpation just over the pubis. No lesions of the external structures. No wall motion tenderness. No fb in the vag vault. Sore to palpation of the right upper vaginal wall. No adnexal mass. Chaperone present during examination.  Musculoskeletal: Normal range of motion.  Lymphadenopathy:       Head (right side): No submandibular adenopathy present.       Head (left side): No submandibular adenopathy present.    She has no cervical adenopathy.  Neurological: She is alert and oriented to person, place, and time. She has normal strength. No cranial nerve deficit or sensory deficit.  Skin: Skin is warm and dry.  Psychiatric: She has a normal mood and affect. Her speech is normal.    ED Course  Procedures (including critical care time)  Labs Reviewed - No data to display No results found.   No diagnosis found.    MDM  I have reviewed nursing notes, vital signs, and all appropriate lab and imaging results for this patient.* UA negative. Wet prep reveals BV. Preg test negative. Pain is mostly at the supra pubic area. No hx of trauma. Pt to see Gyn MD for recheck. Rx for flagyl and Norco given to the patient.       Kathie Dike, [place] 09/20/12 1454

## 2012-09-16 NOTE — ED Notes (Signed)
Pt reports a pain in her groin for the past few weeks. Does not have a PCP to see.

## 2012-09-17 LAB — URINALYSIS, ROUTINE W REFLEX MICROSCOPIC
Bilirubin Urine: NEGATIVE
Glucose, UA: NEGATIVE mg/dL
Hgb urine dipstick: NEGATIVE
Ketones, ur: NEGATIVE mg/dL
Leukocytes, UA: NEGATIVE
Nitrite: NEGATIVE
Protein, ur: NEGATIVE mg/dL
Specific Gravity, Urine: 1.025 (ref 1.005–1.030)
Urobilinogen, UA: 0.2 mg/dL (ref 0.0–1.0)
pH: 6 (ref 5.0–8.0)

## 2012-09-17 LAB — WET PREP, GENITAL: Trich, Wet Prep: NONE SEEN

## 2012-09-17 LAB — POCT PREGNANCY, URINE: Preg Test, Ur: NEGATIVE

## 2012-09-17 MED ORDER — IBUPROFEN 800 MG PO TABS
800.0000 mg | ORAL_TABLET | Freq: Once | ORAL | Status: AC
  Administered 2012-09-17: 800 mg via ORAL
  Filled 2012-09-17: qty 1

## 2012-09-17 MED ORDER — HYDROCODONE-ACETAMINOPHEN 5-325 MG PO TABS: 1.0000 | ORAL_TABLET | ORAL | Status: AC | PRN

## 2012-09-17 MED ORDER — DICLOFENAC SODIUM 75 MG PO TBEC: 75.0000 mg | DELAYED_RELEASE_TABLET | Freq: Two times a day (BID) | ORAL | Status: DC

## 2012-09-17 MED ORDER — HYDROCODONE-ACETAMINOPHEN 5-325 MG PO TABS
1.0000 | ORAL_TABLET | Freq: Once | ORAL | Status: AC
  Administered 2012-09-17: 1 via ORAL
  Filled 2012-09-17: qty 1

## 2012-09-17 MED ORDER — METRONIDAZOLE 500 MG PO TABS: 500.0000 mg | ORAL_TABLET | Freq: Two times a day (BID) | ORAL | Status: DC

## 2012-09-17 MED ORDER — ONDANSETRON HCL 4 MG PO TABS
4.0000 mg | ORAL_TABLET | Freq: Once | ORAL | Status: AC
  Administered 2012-09-17: 4 mg via ORAL
  Filled 2012-09-17: qty 1

## 2012-09-17 MED ORDER — SULFAMETHOXAZOLE-TMP DS 800-160 MG PO TABS
1.0000 | ORAL_TABLET | Freq: Once | ORAL | Status: AC
  Administered 2012-09-17: 1 via ORAL
  Filled 2012-09-17: qty 1

## 2012-09-17 MED ORDER — SULFAMETHOXAZOLE-TRIMETHOPRIM 800-160 MG PO TABS: 1.0000 | ORAL_TABLET | Freq: Two times a day (BID) | ORAL | Status: DC

## 2012-09-18 LAB — GC/CHLAMYDIA PROBE AMP, GENITAL
Chlamydia, DNA Probe: NEGATIVE
GC Probe Amp, Genital: NEGATIVE

## 2012-09-22 NOTE — ED Provider Notes (Signed)
Medical screening examination/treatment/procedure(s) were performed by non-physician practitioner and as supervising physician I was immediately available for consultation/collaboration.  Nicoletta Dress. Colon Branch, MD 09/22/12 [phone]

## 2012-10-07 ENCOUNTER — Emergency Department (HOSPITAL_COMMUNITY): Payer: Self-pay

## 2012-10-07 ENCOUNTER — Other Ambulatory Visit: Payer: Self-pay

## 2012-10-07 ENCOUNTER — Encounter (HOSPITAL_COMMUNITY): Payer: Self-pay | Admitting: Emergency Medicine

## 2012-10-07 ENCOUNTER — Emergency Department (HOSPITAL_COMMUNITY)
Admission: EM | Admit: 2012-10-07 | Discharge: 2012-10-07 | Disposition: A | Discharge status: Home / Self Care | Payer: Self-pay | Attending: Emergency Medicine | Admitting: Emergency Medicine

## 2012-10-07 DIAGNOSIS — J45909 Unspecified asthma, uncomplicated: Secondary | ICD-10-CM | POA: Insufficient documentation

## 2012-10-07 DIAGNOSIS — Z79899 Other long term (current) drug therapy: Secondary | ICD-10-CM | POA: Insufficient documentation

## 2012-10-07 DIAGNOSIS — Z87891 Personal history of nicotine dependence: Secondary | ICD-10-CM | POA: Insufficient documentation

## 2012-10-07 DIAGNOSIS — Z8659 Personal history of other mental and behavioral disorders: Secondary | ICD-10-CM | POA: Insufficient documentation

## 2012-10-07 DIAGNOSIS — R0789 Other chest pain: Secondary | ICD-10-CM | POA: Insufficient documentation

## 2012-10-07 LAB — CBC
HCT: 40.2 % (ref 36.0–46.0)
Hemoglobin: 13.5 g/dL (ref 12.0–15.0)
MCH: 30.2 pg (ref 26.0–34.0)
MCHC: 33.6 g/dL (ref 30.0–36.0)
MCV: 89.9 fL (ref 78.0–100.0)
Platelets: 230 10*3/uL (ref 150–400)
RBC: 4.47 MIL/uL (ref 3.87–5.11)
RDW: 13.3 % (ref 11.5–15.5)
WBC: 7.3 10*3/uL (ref 4.0–10.5)

## 2012-10-07 LAB — BASIC METABOLIC PANEL
BUN: 15 mg/dL (ref 6–23)
CO2: 26 mEq/L (ref 19–32)
Calcium: 9.5 mg/dL (ref 8.4–10.5)
Chloride: 102 mEq/L (ref 96–112)
Creatinine, Ser: 0.68 mg/dL (ref 0.50–1.10)
GFR calc Af Amer: >90 mL/min (ref >90)
GFR calc non Af Amer: >90 mL/min (ref >90)
Glucose, Bld: 81 mg/dL (ref 70–99)
Potassium: 3.4 mEq/L — ABNORMAL LOW (ref 3.5–5.1)
Sodium: 141 mEq/L (ref 135–145)

## 2012-10-07 LAB — TROPONIN I: Troponin I: <0.3 ng/mL (ref <0.30)

## 2012-10-07 LAB — HEPATIC FUNCTION PANEL
ALT: 19 U/L (ref 0–35)
AST: 15 U/L (ref 0–37)
Albumin: 3.8 g/dL (ref 3.5–5.2)
Alkaline Phosphatase: 73 U/L (ref 39–117)
Bilirubin, Direct: 0.1 mg/dL (ref 0.0–0.3)
Indirect Bilirubin: 0.1 mg/dL — ABNORMAL LOW (ref 0.3–0.9)
Total Bilirubin: 0.2 mg/dL — ABNORMAL LOW (ref 0.3–1.2)
Total Protein: 6.8 g/dL (ref 6.0–8.3)

## 2012-10-07 LAB — LIPASE, BLOOD: Lipase: 45 U/L (ref 11–59)

## 2012-10-07 NOTE — ED Provider Notes (Signed)
History     CSN: [id number]  Arrival date & time 10/07/12  0057   First MD Initiated Contact with Patient 10/07/12 0118      Chief Complaint  Patient presents with  . Chest Pain    (Consider location/radiation/quality/duration/timing/severity/associated sxs/prior treatment) HPI Comments: 21 year old female with a history of asthma and obsessive-compulsive disorder who presents with a complaint of a feeling like she cannot swallow. She states this is a feeling like something is stuck in her lower chest / upper abdomen.  It started at 11 PM this evening, is constant, nothing makes it better, or worse, it is not associated with n/v and she has been able to drink fluids - she states that she was trying to swallow water to try to "unclog" her esophagus.  She has no history of abdominal surgery and no problems with her heart or lungs other than mild asthma.  The history is provided by the patient and medical records.    Past Medical History  Diagnosis Date  . Asthma   . Depression   . OCD (obsessive compulsive disorder)     History reviewed. No pertinent past surgical history.  No family history on file.  History  Substance Use Topics  . Smoking status: Former Games developer  . Smokeless tobacco: Not on file  . Alcohol Use: No    OB History    Grav Para Term Preterm Abortions TAB SAB Ect Mult Living                  Review of Systems  All other systems reviewed and are negative.    Allergies  Review of patient's allergies indicates no known allergies.  Home Medications   Current Outpatient Rx  Name  Route  Sig  Dispense  Refill  . ALBUTEROL SULFATE HFA 108 (90 BASE) MCG/ACT IN AERS   Inhalation   Inhale 2 puffs into the lungs every 6 (six) hours as needed.         . ETONOGESTREL 68 MG [place] IMPL   Subcutaneous   Inject 1 each (68 mg total) into the skin once. For contraception   1 each   0   . RISPERIDONE 1 MG PO TABS   Oral   Take 1 tablet (1 mg total) by mouth  at bedtime. STOP LITHIUM, For control of mood and obsessive thinking and compulsive skin picking   30 tablet   0   . SERTRALINE HCL 100 MG PO TABS   Oral   Take 100 mg by mouth at bedtime.         . TRAZODONE HCL 100 MG PO TABS   Oral   Take 50 mg by mouth at bedtime.         Marland Kitchen DICLOFENAC SODIUM 75 MG PO TBEC   Oral   Take 1 tablet (75 mg total) by mouth 2 (two) times daily.   12 tablet   0   . METRONIDAZOLE 500 MG PO TABS   Oral   Take 1 tablet (500 mg total) by mouth 2 (two) times daily.   10 tablet   0     Take with food. No ETOH   . PROMETHAZINE HCL 25 MG PO TABS   Oral   Take 1 tablet (25 mg total) by mouth every 6 (six) hours as needed for nausea.   12 tablet   0   . SULFAMETHOXAZOLE-TRIMETHOPRIM 800-160 MG PO TABS   Oral   Take 1 tablet by mouth 2 (  two) times daily.   14 tablet   0     BP 100/63  Pulse 59  Temp 97.7 F (36.5 C) (Oral)  Resp 18  Ht 5' (1.524 m)  Wt 165 lb (74.844 kg)  BMI 32.22 kg/m2  SpO2 98%  LMP 09/11/2012  Physical Exam  Nursing note and vitals reviewed. Constitutional: She appears well-developed and well-nourished. No distress.  HENT:  Head: Normocephalic and atraumatic.  Mouth/Throat: Oropharynx is clear and moist. No oropharyngeal exudate.  Eyes: Conjunctivae normal and EOM are normal. Pupils are equal, round, and reactive to light. Right eye exhibits no discharge. Left eye exhibits no discharge. No scleral icterus.  Neck: Normal range of motion. Neck supple. No JVD present. No thyromegaly present.  Cardiovascular: Normal rate, regular rhythm, normal heart sounds and intact distal pulses.  Exam reveals no gallop and no friction rub.   No murmur heard. Pulmonary/Chest: Effort normal and breath sounds normal. No respiratory distress. She has no wheezes. She has no rales. She exhibits no tenderness.  Abdominal: Soft. Bowel sounds are normal. She exhibits no distension and no mass. There is tenderness ( Mild tenderness in the  epigastrium and the right upper quadrant, no guarding, no Murphy sign, no peritoneal signs).  Musculoskeletal: Normal range of motion. She exhibits no edema and no tenderness.  Lymphadenopathy:    She has no cervical adenopathy.  Neurological: She is alert. Coordination normal.  Skin: Skin is warm and dry. No rash noted. No erythema.       Single stud piercing to the mid upper chest, no surrounding redness, drainage or tenderness  Psychiatric: She has a normal mood and affect. Her behavior is normal.    ED Course  Procedures (including critical care time)  Labs Reviewed  BASIC METABOLIC PANEL - Abnormal; Notable for the following:    Potassium 3.4 (*)     All other components within normal limits  HEPATIC FUNCTION PANEL - Abnormal; Notable for the following:    Total Bilirubin 0.2 (*)     Indirect Bilirubin 0.1 (*)     All other components within normal limits  CBC  TROPONIN I  LIPASE, BLOOD   Dg Chest Port 1 View  10/07/2012  *RADIOLOGY REPORT*  Clinical Data: Chest pain.  Asthma.  CHEST - 1 VIEW  Comparison:  None.  Findings: The heart size and mediastinal contours are within normal limits.  Both lungs are clear.  IMPRESSION: No active disease.   Original Report Authenticated By: Myles Rosenthal, M.D.      1. Chest discomfort       MDM  At this time the patient appears well, she has no acute distress, has normal vital signs and has an EKG which is totally normal. Lab work has been ordered by nursing staff prior to my evaluation, I have added liver function tests, will rule out pancreatitis and cholecystitis, does not appear to be cardiac or pulmonary in origin.  ED ECG REPORT  I personally interpreted this EKG   Date: 10/07/2012   Rate: 62  Rhythm: normal sinus rhythm and sinus arrhythmia  QRS Axis: normal  Intervals: normal  ST/T Wave abnormalities: normal  Conduction Disutrbances:none  Narrative Interpretation:   Old EKG Reviewed: none available    Labs reviewed, all  tests are normal, the patient appears stable, doubt this is cardiac or pulmonary origin, gastrointestinal tests are also normal, patient stable for discharge to followup, resource list given.     Vida Roller, MD 10/07/12 [phone]

## 2012-10-07 NOTE — ED Notes (Signed)
Patient c/o epigastric pain that started about 2 hours ago; states hurts to take a deep breath.  States it feels like something is stuck.

## 2012-11-01 ENCOUNTER — Emergency Department (HOSPITAL_COMMUNITY)
Admission: EM | Admit: 2012-11-01 | Discharge: 2012-11-01 | Disposition: A | Discharge status: Home / Self Care | Payer: Self-pay | Attending: Emergency Medicine | Admitting: Emergency Medicine

## 2012-11-01 ENCOUNTER — Encounter (HOSPITAL_COMMUNITY): Payer: Self-pay

## 2012-11-01 DIAGNOSIS — IMO0001 Reserved for inherently not codable concepts without codable children: Secondary | ICD-10-CM | POA: Insufficient documentation

## 2012-11-01 DIAGNOSIS — F3289 Other specified depressive episodes: Secondary | ICD-10-CM | POA: Insufficient documentation

## 2012-11-01 DIAGNOSIS — R059 Cough, unspecified: Secondary | ICD-10-CM | POA: Insufficient documentation

## 2012-11-01 DIAGNOSIS — F329 Major depressive disorder, single episode, unspecified: Secondary | ICD-10-CM | POA: Insufficient documentation

## 2012-11-01 DIAGNOSIS — J45909 Unspecified asthma, uncomplicated: Secondary | ICD-10-CM | POA: Insufficient documentation

## 2012-11-01 DIAGNOSIS — R05 Cough: Secondary | ICD-10-CM | POA: Insufficient documentation

## 2012-11-01 DIAGNOSIS — F429 Obsessive-compulsive disorder, unspecified: Secondary | ICD-10-CM | POA: Insufficient documentation

## 2012-11-01 DIAGNOSIS — J111 Influenza due to unidentified influenza virus with other respiratory manifestations: Secondary | ICD-10-CM | POA: Insufficient documentation

## 2012-11-01 DIAGNOSIS — Z79899 Other long term (current) drug therapy: Secondary | ICD-10-CM | POA: Insufficient documentation

## 2012-11-01 DIAGNOSIS — Z87891 Personal history of nicotine dependence: Secondary | ICD-10-CM | POA: Insufficient documentation

## 2012-11-01 DIAGNOSIS — J029 Acute pharyngitis, unspecified: Secondary | ICD-10-CM | POA: Insufficient documentation

## 2012-11-01 MED ORDER — OSELTAMIVIR PHOSPHATE 75 MG PO CAPS: 75.0000 mg | ORAL_CAPSULE | Freq: Two times a day (BID) | ORAL | Status: DC

## 2012-11-01 MED ORDER — IBUPROFEN 800 MG PO TABS: ORAL_TABLET | ORAL | Status: DC

## 2012-11-01 NOTE — ED Notes (Signed)
Pt reports cough and congestion, chills for 2 days.

## 2012-11-01 NOTE — ED Provider Notes (Signed)
History   This chart was scribed for Benny Lennert, MD by Sofie Rower, ED Scribe. The patient was seen in room APA15/[facility] and the patient's care was started at 1:17PM.    CSN: [id number]  Arrival date & time 11/01/12  1145   First MD Initiated Contact with Patient 11/01/12 1317      Chief Complaint  Patient presents with  . Influenza    (Consider location/radiation/quality/duration/timing/severity/associated sxs/prior treatment) Patient is a 21 y.o. female presenting with flu symptoms and cough. The history is provided by the patient and the spouse. No language interpreter was used.  Influenza This is a new problem. The current episode started more than 2 days ago. The problem occurs constantly. The problem has been gradually worsening. Pertinent negatives include no chest pain, no abdominal pain and no headaches. Nothing aggravates the symptoms. Nothing relieves the symptoms. She has tried nothing for the symptoms.  Cough This is a new problem. The current episode started more than 2 days ago. The problem occurs every few minutes. The problem has been gradually worsening. The cough is non-productive. There has been no fever. Associated symptoms include sore throat and myalgias. Pertinent negatives include no chest pain and no headaches. She is not a smoker.    Past Medical History  Diagnosis Date  . Asthma   . Depression   . OCD (obsessive compulsive disorder)     History reviewed. No pertinent past surgical history.  No family history on file.  History  Substance Use Topics  . Smoking status: Former Games developer  . Smokeless tobacco: Not on file  . Alcohol Use: No    OB History    Grav Para Term Preterm Abortions TAB SAB Ect Mult Living                  Review of Systems  Constitutional: Negative for fatigue.  HENT: Positive for sore throat. Negative for congestion, sinus pressure and ear discharge.   Eyes: Negative for discharge.  Respiratory: Positive for cough.     Cardiovascular: Negative for chest pain.  Gastrointestinal: Negative for abdominal pain and diarrhea.  Genitourinary: Negative for frequency and hematuria.  Musculoskeletal: Positive for myalgias. Negative for back pain.  Skin: Negative for rash.  Neurological: Negative for seizures and headaches.  Hematological: Negative.   Psychiatric/Behavioral: Negative for hallucinations.    Allergies  Review of patient's allergies indicates no known allergies.  Home Medications   Current Outpatient Rx  Name  Route  Sig  Dispense  Refill  . ALBUTEROL SULFATE HFA 108 (90 BASE) MCG/ACT IN AERS   Inhalation   Inhale 2 puffs into the lungs every 6 (six) hours as needed.         Marland Kitchen DICLOFENAC SODIUM 75 MG PO TBEC   Oral   Take 1 tablet (75 mg total) by mouth 2 (two) times daily.   12 tablet   0   . ETONOGESTREL 68 MG [place] IMPL   Subcutaneous   Inject 1 each (68 mg total) into the skin once. For contraception   1 each   0   . METRONIDAZOLE 500 MG PO TABS   Oral   Take 1 tablet (500 mg total) by mouth 2 (two) times daily.   10 tablet   0     Take with food. No ETOH   . PROMETHAZINE HCL 25 MG PO TABS   Oral   Take 1 tablet (25 mg total) by mouth every 6 (six) hours as needed for  nausea.   12 tablet   0   . RISPERIDONE 1 MG PO TABS   Oral   Take 1 tablet (1 mg total) by mouth at bedtime. STOP LITHIUM, For control of mood and obsessive thinking and compulsive skin picking   30 tablet   0   . SERTRALINE HCL 100 MG PO TABS   Oral   Take 100 mg by mouth at bedtime.         . SULFAMETHOXAZOLE-TRIMETHOPRIM 800-160 MG PO TABS   Oral   Take 1 tablet by mouth 2 (two) times daily.   14 tablet   0   . TRAZODONE HCL 100 MG PO TABS   Oral   Take 50 mg by mouth at bedtime.           BP 94/57  Pulse 82  Temp 98.5 F (36.9 C) (Oral)  Resp 20  Ht 5\' 1"  (1.549 m)  Wt 165 lb (74.844 kg)  BMI 31.18 kg/m2  SpO2 100%  Physical Exam  Nursing note and vitals  reviewed. Constitutional: She is oriented to person, place, and time. She appears well-developed.  HENT:  Head: Normocephalic and atraumatic.  Eyes: Conjunctivae normal and EOM are normal. No scleral icterus.  Neck: Neck supple. No thyromegaly present.  Cardiovascular: Normal rate and regular rhythm.  Exam reveals no gallop and no friction rub.   No murmur heard. Pulmonary/Chest: No stridor. She has no wheezes. She has no rales. She exhibits no tenderness.  Abdominal: She exhibits no distension. There is no tenderness. There is no rebound.  Musculoskeletal: Normal range of motion. She exhibits no edema.  Lymphadenopathy:    She has no cervical adenopathy.  Neurological: She is oriented to person, place, and time. Coordination normal.  Skin: No rash noted. No erythema.  Psychiatric: She has a normal mood and affect. Her behavior is normal.    ED Course  Procedures (including critical care time)  DIAGNOSTIC STUDIES: Oxygen Saturation is 100% on room air, normal by my interpretation.    COORDINATION OF CARE:  1:22 PM- Treatment plan discussed with patient. Pt agrees with treatment.      Labs Reviewed - No data to display No results found.   No diagnosis found.    MDM        The chart was scribed for me under my direct supervision.  I personally performed the history, physical, and medical decision making and all procedures in the evaluation of this patient.Benny Lennert, MD 11/01/12 2115

## 2013-03-07 ENCOUNTER — Emergency Department (HOSPITAL_COMMUNITY): Payer: Self-pay

## 2013-03-07 ENCOUNTER — Encounter (HOSPITAL_COMMUNITY): Payer: Self-pay | Admitting: *Deleted

## 2013-03-07 ENCOUNTER — Emergency Department (HOSPITAL_COMMUNITY)
Admission: EM | Admit: 2013-03-07 | Discharge: 2013-03-08 | Disposition: A | Discharge status: Home / Self Care | Payer: Self-pay | Attending: Emergency Medicine | Admitting: Emergency Medicine

## 2013-03-07 DIAGNOSIS — J45909 Unspecified asthma, uncomplicated: Secondary | ICD-10-CM | POA: Insufficient documentation

## 2013-03-07 DIAGNOSIS — F3289 Other specified depressive episodes: Secondary | ICD-10-CM | POA: Insufficient documentation

## 2013-03-07 DIAGNOSIS — Z87891 Personal history of nicotine dependence: Secondary | ICD-10-CM | POA: Insufficient documentation

## 2013-03-07 DIAGNOSIS — F429 Obsessive-compulsive disorder, unspecified: Secondary | ICD-10-CM | POA: Insufficient documentation

## 2013-03-07 DIAGNOSIS — F329 Major depressive disorder, single episode, unspecified: Secondary | ICD-10-CM | POA: Insufficient documentation

## 2013-03-07 DIAGNOSIS — F411 Generalized anxiety disorder: Secondary | ICD-10-CM | POA: Insufficient documentation

## 2013-03-07 DIAGNOSIS — IMO0002 Reserved for concepts with insufficient information to code with codable children: Secondary | ICD-10-CM

## 2013-03-07 DIAGNOSIS — M7710 Lateral epicondylitis, unspecified elbow: Secondary | ICD-10-CM | POA: Insufficient documentation

## 2013-03-07 DIAGNOSIS — Z79899 Other long term (current) drug therapy: Secondary | ICD-10-CM | POA: Insufficient documentation

## 2013-03-07 DIAGNOSIS — R209 Unspecified disturbances of skin sensation: Secondary | ICD-10-CM | POA: Insufficient documentation

## 2013-03-07 DIAGNOSIS — R42 Dizziness and giddiness: Secondary | ICD-10-CM | POA: Insufficient documentation

## 2013-03-07 MED ORDER — CYCLOBENZAPRINE HCL 10 MG PO TABS
10.0000 mg | ORAL_TABLET | Freq: Once | ORAL | Status: AC
  Administered 2013-03-07: 10 mg via ORAL
  Filled 2013-03-07: qty 1

## 2013-03-07 MED ORDER — HYDROCODONE-ACETAMINOPHEN 5-325 MG PO TABS
1.0000 | ORAL_TABLET | Freq: Once | ORAL | Status: AC
  Administered 2013-03-07: 1 via ORAL
  Filled 2013-03-07: qty 1

## 2013-03-07 NOTE — ED Notes (Signed)
Pt presents with rt elbow pain since this afternoon, pt denies injury. Pt does state she does a repetitive movement at work all day. No swelling or deformity noted. Radial pulse present and equal. Cap refill brisk distal to pain site.

## 2013-03-07 NOTE — ED Notes (Signed)
Around 3:30 pm pt got clammy and dizzy and felt a dull pain in her right elbow, now it is radiating down her arm making her hands and fingers numb. The pain is also starting to radiate up her right arm. Pt tearful in triage.

## 2013-03-08 MED ORDER — CYCLOBENZAPRINE HCL 10 MG PO TABS: 10.0000 mg | ORAL_TABLET | Freq: Three times a day (TID) | ORAL | Status: DC | PRN

## 2013-03-08 MED ORDER — HYDROCODONE-ACETAMINOPHEN 5-325 MG PO TABS: ORAL_TABLET | ORAL | Status: DC

## 2013-03-08 NOTE — ED Provider Notes (Signed)
History     CSN: [id number]  Arrival date & time 03/07/13  2103   First MD Initiated Contact with Patient 03/07/13 2147      Chief Complaint  Patient presents with  . Elbow Pain  . Hand Pain  . face numb     (Consider location/radiation/quality/duration/timing/severity/associated sxs/prior treatment) HPI Comments: Patient with hx of OCD and depression c/o sudden onset of tingling to her left face with dizziness several hours PTA.  States the incident occurred while at work.  She also c/o pain, internmittent numbness and tingling to the right elbow that has been waxing and waning for several days. But worse just PTA.  She states that she is under a lot of stress at her job and working long hours and states that she is upset because "i don't want to have to work, I want to stay home with my kids".  She states that her psycharist at [facility] changed one of her medications recently and she has an appt to go back to [facility] later this month.  She denies headache, neck pain, visual changes, chest pain, shortness of breath or dizziness at time of arrival to ED.  She also denies SI or HI thoughts.  She denies know injury to elbow, but states that she preforms repetitive movements all day at work  Patient is a 22 y.o. female presenting with arm injury. The history is provided by the patient and a parent.  Arm Injury Location:  Elbow Time since incident:  6 hours Elbow location:  R elbow Pain details:    Quality:  Aching, tingling and throbbing   Radiates to:  R forearm and R fingers   Severity:  Moderate   Onset quality:  Sudden   Timing:  Sporadic   Progression:  Waxing and waning Chronicity:  Recurrent Handedness:  Right-handed Dislocation: no   Foreign body present:  No foreign bodies Prior injury to area:  No Relieved by:  Nothing Worsened by:  Nothing tried Ineffective treatments:  None tried Associated symptoms: tingling   Associated symptoms: no fever, no muscle weakness, no neck  pain, no stiffness and no swelling     Past Medical History  Diagnosis Date  . Asthma   . Depression   . OCD (obsessive compulsive disorder)     History reviewed. No pertinent past surgical history.  History reviewed. No pertinent family history.  History  Substance Use Topics  . Smoking status: Former Games developer  . Smokeless tobacco: Not on file  . Alcohol Use: No    OB History   Grav Para Term Preterm Abortions TAB SAB Ect Mult Living                  Review of Systems  Constitutional: Negative for fever, chills, activity change and appetite change.  HENT: Negative for neck pain and neck stiffness.   Respiratory: Negative for chest tightness and shortness of breath.   Cardiovascular: Negative for chest pain and palpitations.  Gastrointestinal: Negative for nausea, vomiting and abdominal pain.  Genitourinary: Negative for dysuria and difficulty urinating.  Musculoskeletal: Positive for arthralgias. Negative for joint swelling, gait problem and stiffness.  Skin: Negative for color change and wound.  Neurological: Positive for dizziness. Negative for seizures, syncope, facial asymmetry, speech difficulty, weakness, light-headedness, numbness and headaches.  Psychiatric/Behavioral: Negative for suicidal ideas, confusion and agitation. The patient is nervous/anxious.   All other systems reviewed and are negative.    Allergies  Review of patient's allergies indicates no known  allergies.  Home Medications   Current Outpatient Rx  Name  Route  Sig  Dispense  Refill  . cetirizine (ZYRTEC) 10 MG tablet   Oral   Take 10 mg by mouth every morning.         . Citalopram Hydrobromide (CELEXA PO)   Oral   Take 1 tablet by mouth at bedtime.         . risperiDONE (RISPERDAL) 1 MG tablet   Oral   Take 1 tablet (1 mg total) by mouth at bedtime. STOP LITHIUM, For control of mood and obsessive thinking and compulsive skin picking   30 tablet   0   . traZODone (DESYREL) 100  MG tablet   Oral   Take 50-100 mg by mouth at bedtime.          . triamcinolone (NASACORT) 55 MCG/ACT nasal inhaler   Nasal   Place 2 sprays into the nose every morning.         Marland Kitchen albuterol (PROVENTIL HFA;VENTOLIN HFA) 108 (90 BASE) MCG/ACT inhaler   Inhalation   Inhale 2 puffs into the lungs every 6 (six) hours as needed.         . etonogestrel (IMPLANON) 68 MG IMPL implant   Subcutaneous   Inject 1 each (68 mg total) into the skin once. For contraception   1 each   0     BP 109/60  Pulse 98  Temp(Src) 97.6 F (36.4 C) (Oral)  Resp 20  Ht 5' 0.75" (1.543 m)  Wt 175 lb (79.379 kg)  BMI 33.34 kg/m2  Physical Exam  Nursing note and vitals reviewed. Constitutional: She is oriented to person, place, and time. She appears well-developed and well-nourished.  Patient is tearful  HENT:  Head: Normocephalic and atraumatic.  Eyes: Conjunctivae and EOM are normal. Pupils are equal, round, and reactive to light.  Neck: Normal range of motion. Neck supple.  Cardiovascular: Normal rate, regular rhythm, normal heart sounds and intact distal pulses.   No murmur heard. Pulmonary/Chest: Effort normal and breath sounds normal. No respiratory distress. She has no wheezes. She has no rales. She exhibits no tenderness.  Musculoskeletal: Normal range of motion. She exhibits tenderness. She exhibits no edema.       Right elbow: She exhibits normal range of motion, no swelling, no effusion, no deformity and no laceration. Tenderness found. Lateral epicondyle tenderness noted.  Patient points to lateral right elbow as site of tenderness.  Patient has full ROM of the right elbow.  Distal sensation intact.  CR< 2 sec, no erythema or edema of the joint, no distal tenderness.    Lymphadenopathy:    She has no cervical adenopathy.  Neurological: She is alert and oriented to person, place, and time. No cranial nerve deficit or sensory deficit. She exhibits normal muscle tone. Coordination normal.   Reflex Scores:      Tricep reflexes are 2+ on the right side and 2+ on the left side.      Bicep reflexes are 2+ on the right side and 2+ on the left side. Grip strength is strong and equal bilaterally  Skin: Skin is warm and dry.  Psychiatric: Her speech is normal and behavior is normal. Thought content normal. Her mood appears anxious. She expresses no homicidal and no suicidal ideation. She expresses no suicidal plans and no homicidal plans.    ED Course  Procedures (including critical care time)  Labs Reviewed - No data to display Dg Elbow Complete Right  03/07/2013  *RADIOLOGY REPORT*  Clinical Data: Hand and elbow pain  RIGHT ELBOW - COMPLETE 3+ VIEW  Comparison: None.  Findings: No acute fracture, malalignment or elbow joint effusion. Normal bony mineralization.  No focal soft tissue abnormality.  IMPRESSION: Negative radiographs of the elbow   Original Report Authenticated By: Malachy Moan, M.D.         MDM    Pain improved after norco and flexeril.  Cervical spine is NT.  No focal neuro deficits, no sensory deficits.  Patient was tearful on initial exam but is felling better now.  States she has been "stressed out and does not want to work anymore".  Her antidepressant medication was recently changed from Zoloft to celexa and she has appt in 2 weeks with her therapist at [facility]  She denies SI or HI.    Patient and her mother agree to return here if sx's worsen.  Pain has improved.  Remains NV intact.  Elbow sx's likely related to tendonitis. I doubt septic joint.  Compartments are soft  The patient appears reasonably screened and/or stabilized for discharge and I doubt any other medical condition or other [facility] requiring further screening, evaluation, or treatment in the ED at this time prior to discharge.     Lanaysia Fritchman L. Trisha Mangle, PA-C 03/10/13 2156

## 2013-03-11 NOTE — ED Provider Notes (Signed)
Medical screening examination/treatment/procedure(s) were performed by non-physician practitioner and as supervising physician I was immediately available for consultation/collaboration.  Donnetta Hutching, MD 03/11/13 [phone]

## 2013-05-10 ENCOUNTER — Encounter (HOSPITAL_COMMUNITY): Payer: Self-pay | Admitting: Emergency Medicine

## 2013-05-10 ENCOUNTER — Emergency Department (HOSPITAL_COMMUNITY): Payer: Self-pay

## 2013-05-10 ENCOUNTER — Emergency Department (HOSPITAL_COMMUNITY)
Admission: EM | Admit: 2013-05-10 | Discharge: 2013-05-10 | Disposition: A | Discharge status: Home / Self Care | Payer: Self-pay | Attending: Emergency Medicine | Admitting: Emergency Medicine

## 2013-05-10 DIAGNOSIS — Y929 Unspecified place or not applicable: Secondary | ICD-10-CM | POA: Insufficient documentation

## 2013-05-10 DIAGNOSIS — W108XXA Fall (on) (from) other stairs and steps, initial encounter: Secondary | ICD-10-CM | POA: Insufficient documentation

## 2013-05-10 DIAGNOSIS — F329 Major depressive disorder, single episode, unspecified: Secondary | ICD-10-CM | POA: Insufficient documentation

## 2013-05-10 DIAGNOSIS — Z79899 Other long term (current) drug therapy: Secondary | ICD-10-CM | POA: Insufficient documentation

## 2013-05-10 DIAGNOSIS — F3289 Other specified depressive episodes: Secondary | ICD-10-CM | POA: Insufficient documentation

## 2013-05-10 DIAGNOSIS — J45909 Unspecified asthma, uncomplicated: Secondary | ICD-10-CM | POA: Insufficient documentation

## 2013-05-10 DIAGNOSIS — S8990XA Unspecified injury of unspecified lower leg, initial encounter: Secondary | ICD-10-CM | POA: Insufficient documentation

## 2013-05-10 DIAGNOSIS — S99929A Unspecified injury of unspecified foot, initial encounter: Secondary | ICD-10-CM | POA: Insufficient documentation

## 2013-05-10 DIAGNOSIS — M79672 Pain in left foot: Secondary | ICD-10-CM

## 2013-05-10 DIAGNOSIS — Z87891 Personal history of nicotine dependence: Secondary | ICD-10-CM | POA: Insufficient documentation

## 2013-05-10 DIAGNOSIS — W19XXXA Unspecified fall, initial encounter: Secondary | ICD-10-CM

## 2013-05-10 DIAGNOSIS — Y9389 Activity, other specified: Secondary | ICD-10-CM | POA: Insufficient documentation

## 2013-05-10 DIAGNOSIS — M25562 Pain in left knee: Secondary | ICD-10-CM

## 2013-05-10 DIAGNOSIS — F429 Obsessive-compulsive disorder, unspecified: Secondary | ICD-10-CM | POA: Insufficient documentation

## 2013-05-10 MED ORDER — IBUPROFEN 800 MG PO TABS
800.0000 mg | ORAL_TABLET | Freq: Once | ORAL | Status: AC
  Administered 2013-05-10: 800 mg via ORAL
  Filled 2013-05-10: qty 1

## 2013-05-10 NOTE — ED Provider Notes (Signed)
History    CSN: [id number] Arrival date & time 05/10/13  0357  None    Chief Complaint  Patient presents with  . Fall  . Knee Pain  . Foot Pain   (Consider location/radiation/quality/duration/timing/severity/associated sxs/prior Treatment) HPI HPI Comments: Katie Briggs is a 22 y.o. female who presents to the Emergency Department complaining of falling down the stairs as she was getting ready for work. She is having pain to the inner part  Of her left knee and the let foot. No deformity.She walked into the room on her own accord.     Past Medical History  Diagnosis Date  . Asthma   . Depression   . OCD (obsessive compulsive disorder)    History reviewed. No pertinent past surgical history. No family history on file. History  Substance Use Topics  . Smoking status: Former Games developer  . Smokeless tobacco: Not on file  . Alcohol Use: No   OB History   Grav Para Term Preterm Abortions TAB SAB Ect Mult Living                 Review of Systems  Constitutional: Negative for fever.       10 Systems reviewed and are negative for acute change except as noted in the HPI.  HENT: Negative for congestion.   Eyes: Negative for discharge and redness.  Respiratory: Negative for cough and shortness of breath.   Cardiovascular: Negative for chest pain.  Gastrointestinal: Negative for vomiting and abdominal pain.  Musculoskeletal: Negative for back pain.       Left knee and foot pain  Skin: Negative for rash.  Neurological: Negative for syncope, numbness and headaches.  Psychiatric/Behavioral:       No behavior change.    Allergies  Review of patient's allergies indicates no known allergies.  Home Medications   Current Outpatient Rx  Name  Route  Sig  Dispense  Refill  . albuterol (PROVENTIL HFA;VENTOLIN HFA) 108 (90 BASE) MCG/ACT inhaler   Inhalation   Inhale 2 puffs into the lungs every 6 (six) hours as needed.         . Citalopram Hydrobromide (CELEXA PO)   Oral  Take 1 tablet by mouth at bedtime.         Marland Kitchen etonogestrel (IMPLANON) 68 MG IMPL implant   Subcutaneous   Inject 1 each (68 mg total) into the skin once. For contraception   1 each   0   . risperiDONE (RISPERDAL) 1 MG tablet   Oral   Take 1 tablet (1 mg total) by mouth at bedtime. STOP LITHIUM, For control of mood and obsessive thinking and compulsive skin picking   30 tablet   0   . traZODone (DESYREL) 100 MG tablet   Oral   Take 50-100 mg by mouth at bedtime.          . cetirizine (ZYRTEC) 10 MG tablet   Oral   Take 10 mg by mouth every morning.         . cyclobenzaprine (FLEXERIL) 10 MG tablet   Oral   Take 1 tablet (10 mg total) by mouth 3 (three) times daily as needed.   21 tablet   0   . HYDROcodone-acetaminophen (NORCO/VICODIN) 5-325 MG per tablet      Take one-two tabs po q 4-6 hrs prn pain   12 tablet   0   . triamcinolone (NASACORT) 55 MCG/ACT nasal inhaler   Nasal   Place 2 sprays into  the nose every morning.          There were no vitals taken for this visit. Physical Exam  Nursing note and vitals reviewed. Constitutional:  Awake, alert, nontoxic appearance.  HENT:  Head: Atraumatic.  Eyes: EOM are normal. Pupils are equal, round, and reactive to light. Right eye exhibits no discharge. Left eye exhibits no discharge.  Neck: Neck supple.  Cardiovascular: Normal rate and intact distal pulses.   Pulmonary/Chest: Effort normal. She exhibits no tenderness.  Abdominal: Soft. There is no tenderness. There is no rebound.  Musculoskeletal: She exhibits no tenderness.  Baseline ROM, no obvious new focal weakness.FROM at the left  knee,  Left ankle. Pulses 2+. Small bruise to the inside of her left knee.  Neurological:  Mental status and motor strength appears baseline for patient and situation.  Skin: No rash noted.  Psychiatric: She has a normal mood and affect.    ED Course  Procedures (including critical care time) Medications  ibuprofen  (ADVIL,MOTRIN) tablet 800 mg (not administered)    MDM  Patient who fell while going down the stairs as she was getting ready for work. No deformity, small bruise to the inside of her left knee. Walked into the ER on her own. Pt stable in ED with no significant deterioration in condition.The patient appears reasonably screened and/or stabilized for discharge and I doubt any other medical condition or other [facility] requiring further screening, evaluation, or treatment in the ED at this time prior to discharge.  MDM Reviewed: nursing note and vitals     Nicoletta Dress. Colon Branch, MD 05/10/13 [phone]

## 2013-05-10 NOTE — ED Notes (Signed)
Patient states she got up to get ready for work and fell.  C/o pain to left knee and left foot.

## 2013-06-28 ENCOUNTER — Emergency Department (HOSPITAL_COMMUNITY)
Admission: EM | Admit: 2013-06-28 | Discharge: 2013-06-28 | Disposition: A | Discharge status: Home / Self Care | Payer: Self-pay | Attending: Emergency Medicine | Admitting: Emergency Medicine

## 2013-06-28 ENCOUNTER — Encounter (HOSPITAL_COMMUNITY): Payer: Self-pay

## 2013-06-28 DIAGNOSIS — M545 Low back pain, unspecified: Secondary | ICD-10-CM | POA: Insufficient documentation

## 2013-06-28 DIAGNOSIS — R110 Nausea: Secondary | ICD-10-CM | POA: Insufficient documentation

## 2013-06-28 DIAGNOSIS — F329 Major depressive disorder, single episode, unspecified: Secondary | ICD-10-CM | POA: Insufficient documentation

## 2013-06-28 DIAGNOSIS — N390 Urinary tract infection, site not specified: Secondary | ICD-10-CM | POA: Insufficient documentation

## 2013-06-28 DIAGNOSIS — F3289 Other specified depressive episodes: Secondary | ICD-10-CM | POA: Insufficient documentation

## 2013-06-28 DIAGNOSIS — Z79899 Other long term (current) drug therapy: Secondary | ICD-10-CM | POA: Insufficient documentation

## 2013-06-28 DIAGNOSIS — F429 Obsessive-compulsive disorder, unspecified: Secondary | ICD-10-CM | POA: Insufficient documentation

## 2013-06-28 DIAGNOSIS — Z87891 Personal history of nicotine dependence: Secondary | ICD-10-CM | POA: Insufficient documentation

## 2013-06-28 DIAGNOSIS — Z3202 Encounter for pregnancy test, result negative: Secondary | ICD-10-CM | POA: Insufficient documentation

## 2013-06-28 DIAGNOSIS — R42 Dizziness and giddiness: Secondary | ICD-10-CM | POA: Insufficient documentation

## 2013-06-28 DIAGNOSIS — J45909 Unspecified asthma, uncomplicated: Secondary | ICD-10-CM | POA: Insufficient documentation

## 2013-06-28 LAB — URINE MICROSCOPIC-ADD ON

## 2013-06-28 LAB — URINALYSIS, ROUTINE W REFLEX MICROSCOPIC
Bilirubin Urine: NEGATIVE
Glucose, UA: NEGATIVE mg/dL
Hgb urine dipstick: NEGATIVE
Ketones, ur: NEGATIVE mg/dL
Nitrite: NEGATIVE
Protein, ur: NEGATIVE mg/dL
Specific Gravity, Urine: >1.03 — ABNORMAL HIGH (ref 1.005–1.030)
Urobilinogen, UA: 4 mg/dL — ABNORMAL HIGH (ref 0.0–1.0)
pH: 6 (ref 5.0–8.0)

## 2013-06-28 LAB — PREGNANCY, URINE: Preg Test, Ur: NEGATIVE

## 2013-06-28 MED ORDER — PHENAZOPYRIDINE HCL 200 MG PO TABS: 200.0000 mg | ORAL_TABLET | Freq: Three times a day (TID) | ORAL | Status: DC

## 2013-06-28 MED ORDER — PHENAZOPYRIDINE HCL 100 MG PO TABS
200.0000 mg | ORAL_TABLET | Freq: Once | ORAL | Status: AC
  Administered 2013-06-28: 200 mg via ORAL
  Filled 2013-06-28: qty 1
  Filled 2013-06-28: qty 2

## 2013-06-28 MED ORDER — NITROFURANTOIN MONOHYD MACRO 100 MG PO CAPS
100.0000 mg | ORAL_CAPSULE | Freq: Once | ORAL | Status: AC
  Administered 2013-06-28: 100 mg via ORAL
  Filled 2013-06-28: qty 1

## 2013-06-28 MED ORDER — NITROFURANTOIN MONOHYD MACRO 100 MG PO CAPS: 100.0000 mg | ORAL_CAPSULE | Freq: Two times a day (BID) | ORAL | Status: DC

## 2013-06-28 NOTE — ED Provider Notes (Signed)
CSN: [id number]     Arrival date & time 06/28/13  0335 History     First MD Initiated Contact with Patient 06/28/13 0425     Chief Complaint  Patient presents with  . Abdominal Pain   (Consider location/radiation/quality/duration/timing/severity/associated sxs/prior Treatment) HPI This is a 22 year old female 1 day history of lower abdominal pain which she describes as "knots". Is of moderate severity. It is worse with palpation or movement. She is also having low back pain which she describes as feeling like she had done repetitive lifting. She has had chills but no fever. She has had occasional lightheadedness and some mild nausea. She has not had any vomiting or diarrhea. She denies burning with urination, hematuria, vaginal bleeding or vaginal discharge.  Past Medical History  Diagnosis Date  . Asthma   . Depression   . OCD (obsessive compulsive disorder)    History reviewed. No pertinent past surgical history. No family history on file. History  Substance Use Topics  . Smoking status: Former Games developer  . Smokeless tobacco: Not on file  . Alcohol Use: No   OB History   Grav Para Term Preterm Abortions TAB SAB Ect Mult Living                 Review of Systems  All other systems reviewed and are negative.    Allergies  Review of patient's allergies indicates no known allergies.  Home Medications   Current Outpatient Rx  Name  Route  Sig  Dispense  Refill  . albuterol (PROVENTIL HFA;VENTOLIN HFA) 108 (90 BASE) MCG/ACT inhaler   Inhalation   Inhale 2 puffs into the lungs every 6 (six) hours as needed.         . Citalopram Hydrobromide (CELEXA PO)   Oral   Take 1 tablet by mouth at bedtime.         Marland Kitchen etonogestrel (IMPLANON) 68 MG IMPL implant   Subcutaneous   Inject 1 each (68 mg total) into the skin once. For contraception   1 each   0   . risperiDONE (RISPERDAL) 1 MG tablet   Oral   Take 1 tablet (1 mg total) by mouth at bedtime. STOP LITHIUM, For  control of mood and obsessive thinking and compulsive skin picking   30 tablet   0   . cetirizine (ZYRTEC) 10 MG tablet   Oral   Take 10 mg by mouth every morning.         . cyclobenzaprine (FLEXERIL) 10 MG tablet   Oral   Take 1 tablet (10 mg total) by mouth 3 (three) times daily as needed.   21 tablet   0   . HYDROcodone-acetaminophen (NORCO/VICODIN) 5-325 MG per tablet      Take one-two tabs po q 4-6 hrs prn pain   12 tablet   0   . traZODone (DESYREL) 100 MG tablet   Oral   Take 50-100 mg by mouth at bedtime.          . triamcinolone (NASACORT) 55 MCG/ACT nasal inhaler   Nasal   Place 2 sprays into the nose every morning.          BP 108/65  Pulse 98  Temp(Src) 98.5 F (36.9 C) (Oral)  Resp 20  Ht 5\' 1"  (1.549 m)  Wt 179 lb (81.194 kg)  BMI 33.84 kg/m2  SpO2 98%  Physical Exam General: Well-developed, well-nourished female in no acute distress; appearance consistent with age of record HENT: normocephalic; atraumatic Eyes:  pupils equal, round and reactive to light; extraocular muscles intact Neck: supple Heart: regular rate and rhythm Lungs: clear to auscultation bilaterally Abdomen: soft; nondistended; suprapubic tenderness; no masses or hepatosplenomegaly; bowel sounds present GU: No CVA tenderness Extremities: No deformity; full range of motion; pulses normal Neurologic: Awake, alert and oriented; motor function intact in all extremities and symmetric; no facial droop Skin: Warm and dry Psychiatric: Normal mood and affect    ED Course   Procedures (including critical care time)    MDM   Nursing notes and vitals signs, including pulse oximetry, reviewed.  Summary of this visit's results, reviewed by myself:  Labs:  Results for orders placed during the hospital encounter of 06/28/13 (from the past 24 hour(s))  PREGNANCY, URINE     Status: None   Collection Time    06/28/13  3:57 AM      Result Value Range   Preg Test, Ur NEGATIVE   NEGATIVE  URINALYSIS, ROUTINE W REFLEX MICROSCOPIC     Status: Abnormal   Collection Time    06/28/13  3:57 AM      Result Value Range   Color, Urine YELLOW  YELLOW   APPearance CLEAR  CLEAR   Specific Gravity, Urine >1.030 (*) 1.005 - 1.030   pH 6.0  5.0 - 8.0   Glucose, UA NEGATIVE  NEGATIVE mg/dL   Hgb urine dipstick NEGATIVE  NEGATIVE   Bilirubin Urine NEGATIVE  NEGATIVE   Ketones, ur NEGATIVE  NEGATIVE mg/dL   Protein, ur NEGATIVE  NEGATIVE mg/dL   Urobilinogen, UA 4.0 (*) 0.0 - 1.0 mg/dL   Nitrite NEGATIVE  NEGATIVE   Leukocytes, UA SMALL (*) NEGATIVE  URINE MICROSCOPIC-ADD ON     Status: Abnormal   Collection Time    06/28/13  3:57 AM      Result Value Range   Squamous Epithelial / LPF FEW (*) RARE   WBC, UA 7-10  <3 WBC/hpf   RBC / HPF 3-6  <3 RBC/hpf   Bacteria, UA MANY (*) RARE      Hanley Seamen, MD 06/28/13 [phone]

## 2013-06-28 NOTE — ED Notes (Signed)
Lower back pain, abd pain, nausea, dizzy x 1 day

## 2013-06-29 LAB — URINE CULTURE
Colony Count: NO GROWTH
Culture: NO GROWTH

## 2013-09-24 ENCOUNTER — Emergency Department (HOSPITAL_COMMUNITY): Payer: Medicaid Other

## 2013-09-24 ENCOUNTER — Emergency Department (HOSPITAL_COMMUNITY)
Admission: EM | Admit: 2013-09-24 | Discharge: 2013-09-24 | Disposition: A | Discharge status: Home / Self Care | Payer: Medicaid Other | Attending: Emergency Medicine | Admitting: Emergency Medicine

## 2013-09-24 ENCOUNTER — Encounter (HOSPITAL_COMMUNITY): Payer: Self-pay | Admitting: Emergency Medicine

## 2013-09-24 DIAGNOSIS — Z87891 Personal history of nicotine dependence: Secondary | ICD-10-CM | POA: Insufficient documentation

## 2013-09-24 DIAGNOSIS — F3289 Other specified depressive episodes: Secondary | ICD-10-CM | POA: Insufficient documentation

## 2013-09-24 DIAGNOSIS — Z79899 Other long term (current) drug therapy: Secondary | ICD-10-CM | POA: Insufficient documentation

## 2013-09-24 DIAGNOSIS — M549 Dorsalgia, unspecified: Secondary | ICD-10-CM | POA: Insufficient documentation

## 2013-09-24 DIAGNOSIS — F329 Major depressive disorder, single episode, unspecified: Secondary | ICD-10-CM | POA: Insufficient documentation

## 2013-09-24 DIAGNOSIS — M436 Torticollis: Secondary | ICD-10-CM | POA: Insufficient documentation

## 2013-09-24 DIAGNOSIS — J45909 Unspecified asthma, uncomplicated: Secondary | ICD-10-CM | POA: Insufficient documentation

## 2013-09-24 DIAGNOSIS — F429 Obsessive-compulsive disorder, unspecified: Secondary | ICD-10-CM | POA: Insufficient documentation

## 2013-09-24 DIAGNOSIS — H9209 Otalgia, unspecified ear: Secondary | ICD-10-CM | POA: Insufficient documentation

## 2013-09-24 DIAGNOSIS — M25519 Pain in unspecified shoulder: Secondary | ICD-10-CM | POA: Insufficient documentation

## 2013-09-24 MED ORDER — IBUPROFEN 600 MG PO TABS: 600.0000 mg | ORAL_TABLET | Freq: Four times a day (QID) | ORAL | Status: DC | PRN

## 2013-09-24 MED ORDER — METHOCARBAMOL 500 MG PO TABS: 1000.0000 mg | ORAL_TABLET | Freq: Four times a day (QID) | ORAL | Status: AC

## 2013-09-24 MED ORDER — METHOCARBAMOL 500 MG PO TABS
1000.0000 mg | ORAL_TABLET | Freq: Once | ORAL | Status: AC
  Administered 2013-09-24: 1000 mg via ORAL
  Filled 2013-09-24: qty 2

## 2013-09-24 MED ORDER — IBUPROFEN 800 MG PO TABS
800.0000 mg | ORAL_TABLET | Freq: Once | ORAL | Status: AC
  Administered 2013-09-24: 800 mg via ORAL
  Filled 2013-09-24: qty 1

## 2013-09-24 NOTE — ED Provider Notes (Signed)
CSN: [id number]     Arrival date & time 09/24/13  1529 History   First MD Initiated Contact with Patient 09/24/13 1611     Chief Complaint  Patient presents with  . Otalgia  . Neck Pain  . Back Pain   (Consider location/radiation/quality/duration/timing/severity/associated sxs/prior Treatment) HPI Comments: Katie Briggs is a 22 y.o. Female presenting with left neck and shoulder pain which has been present for the past 3-4 weeks,  Then worsened this morning involving tightness and inability to turn her head to the left without pain.  She describes muscle spasm across her left shoulder, radiates to her ear and across her left upper back.  She denies weakness or numbness in her arms,  But did not transient tingling in her 4th and fifth fingers which is now resolved.  She denies injury and has had no fevers, chills,  Dizziness, headache, nasal congestion or other uri sx.  She took a hydrocodone this am without relief.     The history is provided by the patient.    Past Medical History  Diagnosis Date  . Asthma   . Depression   . OCD (obsessive compulsive disorder)    History reviewed. No pertinent past surgical history. History reviewed. No pertinent family history. History  Substance Use Topics  . Smoking status: Former Games developer  . Smokeless tobacco: Not on file  . Alcohol Use: No   OB History   Grav Para Term Preterm Abortions TAB SAB Ect Mult Living                 Review of Systems  Constitutional: Negative for fever.  Respiratory: Negative for shortness of breath.   Cardiovascular: Negative for chest pain and leg swelling.  Gastrointestinal: Negative for abdominal pain, constipation and abdominal distention.  Genitourinary: Negative for dysuria, urgency, frequency, flank pain and difficulty urinating.  Musculoskeletal: Positive for back pain, neck pain and neck stiffness. Negative for gait problem and joint swelling.  Skin: Negative for rash.  Neurological: Negative  for weakness and numbness.    Allergies  Review of patient's allergies indicates no known allergies.  Home Medications   Current Outpatient Rx  Name  Route  Sig  Dispense  Refill  . HYDROcodone-acetaminophen (NORCO/VICODIN) 5-325 MG per tablet      Take one-two tabs po q 4-6 hrs prn pain   12 tablet   0   . albuterol (PROVENTIL HFA;VENTOLIN HFA) 108 (90 BASE) MCG/ACT inhaler   Inhalation   Inhale 2 puffs into the lungs every 6 (six) hours as needed.         . Citalopram Hydrobromide (CELEXA PO)   Oral   Take 1 tablet by mouth at bedtime.         Marland Kitchen etonogestrel (IMPLANON) 68 MG IMPL implant   Subcutaneous   Inject 1 each (68 mg total) into the skin once. For contraception   1 each   0   . risperiDONE (RISPERDAL) 1 MG tablet   Oral   Take 1 tablet (1 mg total) by mouth at bedtime. STOP LITHIUM, For control of mood and obsessive thinking and compulsive skin picking   30 tablet   0   . traZODone (DESYREL) 100 MG tablet   Oral   Take 50-100 mg by mouth at bedtime.           BP 106/59  Pulse 87  Temp(Src) 98.2 F (36.8 C) (Oral)  Resp 18  SpO2 98% Physical Exam  Nursing note and  vitals reviewed. Constitutional: She appears well-developed and well-nourished.  HENT:  Head: Normocephalic.  Right Ear: Tympanic membrane normal.  Left Ear: Tympanic membrane normal.  Eyes: Conjunctivae are normal.  Neck: Trachea normal. Muscular tenderness present. No spinous process tenderness present. Decreased range of motion present.    Cardiovascular: Normal rate and intact distal pulses.   Pedal pulses normal.  Pulmonary/Chest: Effort normal.  Abdominal: Soft. Bowel sounds are normal. She exhibits no distension and no mass.  Musculoskeletal: She exhibits no edema.       Lumbar back: She exhibits tenderness. She exhibits no swelling, no edema and no spasm.  Neurological: She is alert. She has normal strength. She displays no atrophy and no tremor. No sensory deficit. Gait  normal.  Reflex Scores:      Bicep reflexes are 2+ on the right side and 2+ on the left side. No strength deficit noted in forearm flexor and extensor muscle groups.  Equal grip strength.  Skin: Skin is warm and dry.  Psychiatric: She has a normal mood and affect.    ED Course  Procedures (including critical care time) Labs Review Labs Reviewed - No data to display Imaging Review No results found.  EKG Interpretation   None       MDM  No diagnosis found. Patients labs and/or radiological studies were viewed and considered during the medical decision making and disposition process.  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  Encouraged heat tx followed by ROM and stretching exercises.  Prescribed robaxin, ibuprofen.    The patient appears reasonably screened and/or stabilized for discharge and I doubt any other medical condition or other [facility] requiring further screening, evaluation, or treatment in the ED at this time prior to discharge.     Burgess Amor, PA-C 09/24/13 1725

## 2013-09-24 NOTE — ED Notes (Signed)
Pt reports left ear pain since this morning. Pt also reports neck and back pain x4 weeks.

## 2013-09-28 NOTE — ED Provider Notes (Signed)
Medical screening examination/treatment/procedure(s) were performed by non-physician practitioner and as supervising physician I was immediately available for consultation/collaboration.  EKG Interpretation   None        Yomayra Tate, MD 09/28/13 1645 

## 2013-10-08 ENCOUNTER — Encounter: Payer: Self-pay | Admitting: Internal Medicine

## 2013-10-08 ENCOUNTER — Encounter (HOSPITAL_COMMUNITY): Payer: Self-pay | Admitting: Emergency Medicine

## 2013-10-08 ENCOUNTER — Emergency Department (HOSPITAL_COMMUNITY)
Admission: EM | Admit: 2013-10-08 | Discharge: 2013-10-08 | Disposition: A | Discharge status: Home / Self Care | Payer: Medicaid Other | Attending: Emergency Medicine | Admitting: Emergency Medicine

## 2013-10-08 DIAGNOSIS — J45909 Unspecified asthma, uncomplicated: Secondary | ICD-10-CM | POA: Insufficient documentation

## 2013-10-08 DIAGNOSIS — K625 Hemorrhage of anus and rectum: Secondary | ICD-10-CM

## 2013-10-08 DIAGNOSIS — F3289 Other specified depressive episodes: Secondary | ICD-10-CM | POA: Insufficient documentation

## 2013-10-08 DIAGNOSIS — F429 Obsessive-compulsive disorder, unspecified: Secondary | ICD-10-CM | POA: Insufficient documentation

## 2013-10-08 DIAGNOSIS — K921 Melena: Secondary | ICD-10-CM | POA: Insufficient documentation

## 2013-10-08 DIAGNOSIS — Z79899 Other long term (current) drug therapy: Secondary | ICD-10-CM | POA: Insufficient documentation

## 2013-10-08 DIAGNOSIS — F329 Major depressive disorder, single episode, unspecified: Secondary | ICD-10-CM | POA: Insufficient documentation

## 2013-10-08 DIAGNOSIS — Z87891 Personal history of nicotine dependence: Secondary | ICD-10-CM | POA: Insufficient documentation

## 2013-10-08 LAB — BASIC METABOLIC PANEL
BUN: 8 mg/dL (ref 6–23)
CO2: 29 mEq/L (ref 19–32)
Calcium: 10.1 mg/dL (ref 8.4–10.5)
Chloride: 101 mEq/L (ref 96–112)
Creatinine, Ser: 0.74 mg/dL (ref 0.50–1.10)
GFR calc Af Amer: >90 mL/min (ref >90)
GFR calc non Af Amer: >90 mL/min (ref >90)
Glucose, Bld: 90 mg/dL (ref 70–99)
Potassium: 4.3 mEq/L (ref 3.5–5.1)
Sodium: 139 mEq/L (ref 135–145)

## 2013-10-08 LAB — CBC WITH DIFFERENTIAL/PLATELET
Basophils Absolute: 0 10*3/uL (ref 0.0–0.1)
Basophils Relative: 1 % (ref 0–1)
Eosinophils Absolute: 0.4 10*3/uL (ref 0.0–0.7)
Eosinophils Relative: 5 % (ref 0–5)
HCT: 44.2 % (ref 36.0–46.0)
Hemoglobin: 14.9 g/dL (ref 12.0–15.0)
Lymphocytes Relative: 31 % (ref 12–46)
Lymphs Abs: 2.3 10*3/uL (ref 0.7–4.0)
MCH: 30.5 pg (ref 26.0–34.0)
MCHC: 33.7 g/dL (ref 30.0–36.0)
MCV: 90.4 fL (ref 78.0–100.0)
Monocytes Absolute: 0.5 10*3/uL (ref 0.1–1.0)
Monocytes Relative: 6 % (ref 3–12)
Neutro Abs: 4.3 10*3/uL (ref 1.7–7.7)
Neutrophils Relative %: 57 % (ref 43–77)
Platelets: 255 10*3/uL (ref 150–400)
RBC: 4.89 MIL/uL (ref 3.87–5.11)
RDW: 12.8 % (ref 11.5–15.5)
WBC: 7.6 10*3/uL (ref 4.0–10.5)

## 2013-10-08 LAB — OCCULT BLOOD, POC DEVICE: Fecal Occult Bld: POSITIVE — AB

## 2013-10-08 NOTE — ED Provider Notes (Signed)
CSN: [id number]     Arrival date & time 10/08/13  1127 History   First MD Initiated Contact with Patient 10/08/13 1158     No chief complaint on file.  (Consider location/radiation/quality/duration/timing/severity/associated sxs/prior Treatment) HPI  This is a 22 year old female with history of depression who presents with rectal bleeding. The patient reports a two-day history of 2 episodes of bright red blood per rectum. Patient reports that yesterday afternoon she had normal bowel movement and noted blood in the toilet and on the toilet paper.  Patient states she had a second episode this morning that again was bright red blood following a normal bowel movement. She describes bright red blood in the toilet that was more than "streaking." She denies any rectal pain. She denies any abdominal pain. She's never had anything like this in the past. She denies constipation. She denies hematemesis.  Past Medical History  Diagnosis Date  . Asthma   . Depression   . OCD (obsessive compulsive disorder)    History reviewed. No pertinent past surgical history. History reviewed. No pertinent family history. History  Substance Use Topics  . Smoking status: Former Games developer  . Smokeless tobacco: Not on file  . Alcohol Use: No   OB History   Grav Para Term Preterm Abortions TAB SAB Ect Mult Living                 Review of Systems  Constitutional: Negative for fever.  Respiratory: Negative for cough, chest tightness and shortness of breath.   Cardiovascular: Negative for chest pain.  Gastrointestinal: Positive for blood in stool. Negative for nausea, vomiting and abdominal pain.  Genitourinary: Negative for dysuria.  All other systems reviewed and are negative.    Allergies  Review of patient's allergies indicates no known allergies.  Home Medications   Current Outpatient Rx  Name  Route  Sig  Dispense  Refill  . albuterol (PROVENTIL HFA;VENTOLIN HFA) 108 (90 BASE) MCG/ACT inhaler  Inhalation   Inhale 2 puffs into the lungs every 6 (six) hours as needed.         . citalopram (CELEXA) 20 MG tablet   Oral   Take 20 mg by mouth daily.         Marland Kitchen etonogestrel (IMPLANON) 68 MG IMPL implant   Subcutaneous   Inject 1 each (68 mg total) into the skin once. For contraception   1 each   0   . risperiDONE (RISPERDAL) 1 MG tablet   Oral   Take 1 mg by mouth 2 (two) times daily.          BP 107/66  Pulse 70  Temp(Src) 98.4 F (36.9 C) (Oral)  Resp 20  Ht 5' 0.75" (1.543 m)  Wt 175 lb (79.379 kg)  BMI 33.34 kg/m2  SpO2 97% Physical Exam  Nursing note and vitals reviewed. Constitutional: She is oriented to person, place, and time. She appears well-developed and well-nourished.  HENT:  Head: Normocephalic and atraumatic.  Eyes: Pupils are equal, round, and reactive to light.  Neck: Neck supple.  Cardiovascular: Normal rate, regular rhythm and normal heart sounds.   No murmur heard. Pulmonary/Chest: Effort normal. No respiratory distress.  Abdominal: Soft. Bowel sounds are normal. There is no tenderness. There is no rebound and no guarding.  Genitourinary: Guaiac positive stool.  Rectal exam grossly positive with a small amount of blood on Michael, Hemoccult positive  Neurological: She is alert and oriented to person, place, and time.  Skin: Skin is warm  and dry.  Psychiatric: She has a normal mood and affect.    ED Course  Procedures (including critical care time) Labs Review Labs Reviewed  OCCULT BLOOD, POC DEVICE - Abnormal; Notable for the following:    Fecal Occult Bld POSITIVE (*)    All other components within normal limits  CBC WITH DIFFERENTIAL  BASIC METABOLIC PANEL   Imaging Review No results found.  EKG Interpretation   None       MDM   1. Bright red blood per rectum    Patient presents with 2 episodes of bright red blood per exam. She is nontoxic-appearing on exam. Exam is notable for grossly and Hemoccult positive rectal  exam. Patient is not orthostatic. She is otherwise asymptomatic.  Patient's hemoglobin is 14.9 and within normal limits. I discussed the patient with Dr. Kendell Bane.  Given the patient's age, stable vital signs, and the fact that she is otherwise asymptomatic, I feel the patient can safely followup in clinic. Dr.Rourke will schedule her in the next 1-2 days.  I discussed this plan with the patient. Patient was given strict return precautions if she has worsening bleeding, development of abdominal pain, dizziness, syncope or any other worsening symptoms.  After history, exam, and medical workup I feel the patient has been appropriately medically screened and is safe for discharge home. Pertinent diagnoses were discussed with the patient. Patient was given return precautions.     Shon Baton, MD 10/08/13 [phone]

## 2013-10-08 NOTE — ED Notes (Signed)
Rectal bleeding x2 since yesterday.  No NVD  No fever or chills.

## 2013-10-28 ENCOUNTER — Encounter (INDEPENDENT_AMBULATORY_CARE_PROVIDER_SITE_OTHER): Payer: Self-pay

## 2013-10-28 ENCOUNTER — Encounter: Payer: Self-pay | Admitting: Gastroenterology

## 2013-10-28 ENCOUNTER — Ambulatory Visit (INDEPENDENT_AMBULATORY_CARE_PROVIDER_SITE_OTHER): Payer: Self-pay | Admitting: Gastroenterology

## 2013-10-28 ENCOUNTER — Other Ambulatory Visit: Payer: Self-pay | Admitting: Internal Medicine

## 2013-10-28 VITALS — BP 116/70 | HR 104 | Temp 97.6°F | Wt 171.4 lb

## 2013-10-28 DIAGNOSIS — K5900 Constipation, unspecified: Secondary | ICD-10-CM

## 2013-10-28 DIAGNOSIS — K625 Hemorrhage of anus and rectum: Secondary | ICD-10-CM | POA: Insufficient documentation

## 2013-10-28 MED ORDER — PEG-KCL-NACL-NASULF-NA ASC-C 100 G PO SOLR: 1.0000 | ORAL | Status: DC

## 2013-10-28 MED ORDER — HYDROCORTISONE 2.5 % RE CREA: 1.0000 "application " | TOPICAL_CREAM | Freq: Two times a day (BID) | RECTAL | Status: DC

## 2013-10-28 MED ORDER — LINACLOTIDE 145 MCG PO CAPS: 145.0000 ug | ORAL_CAPSULE | Freq: Every day | ORAL | Status: DC

## 2013-10-28 NOTE — Progress Notes (Signed)
Primary Care Physician:  Default, Provider, MD Primary Gastroenterologist:  Dr. Jena Gauss   Chief Complaint  Patient presents with  . Rectal Bleeding    HPI:   Katie Briggs presents today at the referral of the ED secondary to hematochezia. States she had urge to have BM, soft bowel movement, saw bright red blood in toilet and on tissue. Wiped vagina area but had no bleeding from this area. Occurred again 24 hours later. Felt uncomfortable in rectum. Lately stomach has been "gurgly", upset. Dealing more with constipation. Has had one incident where she had severe abdominal pain, unable to have BM, tried to go but couldn't. Finally was able to go and felt better. No nausea, vomiting. No upper GI symptoms. Denies any rectal discomfort or itching currently.   Past Medical History  Diagnosis Date  . Asthma   . Depression   . OCD (obsessive compulsive disorder)   . Bipolar disorder     Past Surgical History  Procedure Laterality Date  . None      Current Outpatient Prescriptions  Medication Sig Dispense Refill  . albuterol (PROVENTIL HFA;VENTOLIN HFA) 108 (90 BASE) MCG/ACT inhaler Inhale 2 puffs into the lungs every 6 (six) hours as needed.      . citalopram (CELEXA) 20 MG tablet Take 20 mg by mouth daily.      Marland Kitchen etonogestrel (IMPLANON) 68 MG IMPL implant Inject 1 each (68 mg total) into the skin once. For contraception  1 each  0  . risperiDONE (RISPERDAL) 1 MG tablet Take 1 mg by mouth 2 (two) times daily.      . hydrocortisone (ANUSOL-HC) 2.5 % rectal cream Place 1 application rectally 2 (two) times daily.  30 g  1  . Linaclotide (LINZESS) 145 MCG CAPS capsule Take 1 capsule (145 mcg total) by mouth daily. 30 minutes before breakfast  30 capsule  3   No current facility-administered medications for this visit.    Allergies as of 10/28/2013  . (No Known Allergies)    Family History  Problem Relation Age of Onset  . Colon polyps Maternal Grandmother   . Colon cancer        great grandmother    History   Social History  . Marital Status: Married    Spouse Name: N/A    Number of Children: 2  . Years of Education: N/A   Occupational History  . Not on file.   Social History Main Topics  . Smoking status: Former Games developer  . Smokeless tobacco: Not on file  . Alcohol Use: No  . Drug Use: No  . Sexual Activity: Yes    Birth Control/ Protection: Implant   Other Topics Concern  . Not on file   Social History Narrative  . No narrative on file    Review of Systems: As mentioned in HPI.   Physical Exam: BP 116/70  Pulse 104  Temp(Src) 97.6 F (36.4 C) (Oral)  Wt 171 lb 6.4 oz (77.747 kg) General:   Alert and oriented. Pleasant and cooperative. Well-nourished and well-developed.  Head:  Normocephalic and atraumatic. Eyes:  Without icterus, sclera clear and conjunctiva pink.  Ears:  Normal auditory acuity. Nose:  No deformity, discharge,  or lesions. Mouth:  No deformity or lesions, oral mucosa pink.  Neck:  Supple, without mass or thyromegaly. Lungs:  Clear to auscultation bilaterally. No wheezes, rales, or rhonchi. No distress.  Heart:  S1, S2 present without murmurs appreciated.  Abdomen:  +BS,  soft, non-tender and non-distended. No HSM noted. No guarding or rebound. No masses appreciated.  Rectal:  Deferred  Msk:  Symmetrical without gross deformities. Normal posture. Extremities:  Without clubbing or edema. Neurologic:  Alert and  oriented x4;  grossly normal neurologically. Skin:  Intact without significant lesions or rashes. Cervical Nodes:  No significant cervical adenopathy. Psych:  Alert and cooperative. Normal mood and affect.  Lab Results  Component Value Date   WBC 7.6 10/08/2013   HGB 14.9 10/08/2013   HCT 44.2 10/08/2013   MCV 90.4 10/08/2013   PLT 255 10/08/2013

## 2013-10-28 NOTE — Patient Instructions (Signed)
We have scheduled you for a colonoscopy with Dr. Jena Gauss in the near future.  I sent a cream to your pharmacy to use rectally twice a day for 7 days.   Start taking Linzess 1 capsule each morning, 30 minutes before breakfast. This is for constipation. We have provided a voucher so you may get the first month free.   Merry Christmas!

## 2013-10-28 NOTE — Assessment & Plan Note (Signed)
22 year old female with moderate volume hematochezia on 2 occasions in the setting of constipation; likely benign anorectal source. Hgb stable. Needs lower GI evaluation in the way of colonoscopy in near future. In interim, will provide anusol cream and more aggressive regimen for constipation.  Proceed with TCS with Dr. Jena Gauss in near future: the risks, benefits, and alternatives have been discussed with the patient in detail. The patient states understanding and desires to proceed. Anusol BID X 7 days Linzess 145 mcg daily

## 2013-10-28 NOTE — Assessment & Plan Note (Signed)
Start Linzess 145 mcg daily. Voucher and prescription provided.  

## 2013-10-28 NOTE — Progress Notes (Signed)
NO PCP

## 2013-11-04 ENCOUNTER — Encounter (HOSPITAL_COMMUNITY): Payer: Self-pay | Admitting: Pharmacy Technician

## 2013-11-12 ENCOUNTER — Encounter (HOSPITAL_COMMUNITY): Payer: Self-pay | Admitting: *Deleted

## 2013-11-12 ENCOUNTER — Encounter (HOSPITAL_COMMUNITY)
Admission: RE | Disposition: A | Discharge status: Home / Self Care | Payer: Self-pay | Source: Ambulatory Visit | Attending: Internal Medicine

## 2013-11-12 ENCOUNTER — Ambulatory Visit (HOSPITAL_COMMUNITY)
Admission: RE | Admit: 2013-11-12 | Discharge: 2013-11-12 | Disposition: A | Discharge status: Home / Self Care | Payer: Medicaid Other | Source: Ambulatory Visit | Attending: Internal Medicine | Admitting: Internal Medicine

## 2013-11-12 DIAGNOSIS — K648 Other hemorrhoids: Secondary | ICD-10-CM

## 2013-11-12 DIAGNOSIS — K5900 Constipation, unspecified: Secondary | ICD-10-CM | POA: Insufficient documentation

## 2013-11-12 DIAGNOSIS — K921 Melena: Secondary | ICD-10-CM

## 2013-11-12 HISTORY — PX: COLONOSCOPY: SHX5424

## 2013-11-12 SURGERY — COLONOSCOPY
Anesthesia: Moderate Sedation

## 2013-11-12 MED ORDER — ONDANSETRON HCL 4 MG/2ML IJ SOLN
INTRAMUSCULAR | Status: DC | PRN
  Administered 2013-11-12: 4 mg via INTRAVENOUS

## 2013-11-12 MED ORDER — SODIUM CHLORIDE 0.9 % IV SOLN
INTRAVENOUS | Status: DC
  Administered 2013-11-12: 14:00:00 via INTRAVENOUS

## 2013-11-12 MED ORDER — MIDAZOLAM HCL 5 MG/5ML IJ SOLN
INTRAMUSCULAR | Status: AC
  Filled 2013-11-12: qty 10

## 2013-11-12 MED ORDER — ONDANSETRON HCL 4 MG/2ML IJ SOLN
INTRAMUSCULAR | Status: AC
  Filled 2013-11-12: qty 2

## 2013-11-12 MED ORDER — MIDAZOLAM HCL 5 MG/5ML IJ SOLN
INTRAMUSCULAR | Status: DC | PRN
  Administered 2013-11-12 (×2): 1 mg via INTRAVENOUS
  Administered 2013-11-12: 2 mg via INTRAVENOUS
  Administered 2013-11-12: 1 mg via INTRAVENOUS
  Administered 2013-11-12: 2 mg via INTRAVENOUS

## 2013-11-12 MED ORDER — SIMETHICONE 40 MG/0.6ML PO SUSP
ORAL | Status: DC | PRN
  Administered 2013-11-12: 15:00:00

## 2013-11-12 MED ORDER — MEPERIDINE HCL 100 MG/ML IJ SOLN
INTRAMUSCULAR | Status: AC
  Filled 2013-11-12: qty 2

## 2013-11-12 MED ORDER — MEPERIDINE HCL 100 MG/ML IJ SOLN
INTRAMUSCULAR | Status: DC | PRN
  Administered 2013-11-12: 50 mg via INTRAVENOUS
  Administered 2013-11-12: 25 mg via INTRAVENOUS
  Administered 2013-11-12: 50 mg via INTRAVENOUS

## 2013-11-12 NOTE — Interval H&P Note (Signed)
History and Physical Interval Note:  11/12/2013 2:15 PM  Katie Briggs  has presented today for surgery, with the diagnosis of Constipation  The various methods of treatment have been discussed with the patient and family. After consideration of risks, benefits and other options for treatment, the patient has consented to  Procedure(s) with comments: COLONOSCOPY (N/A) - 2:40 as a surgical intervention .  The patient's history has been reviewed, patient examined, no change in status, stable for surgery.  I have reviewed the patient's chart and labs.  Questions were answered to the patient's satisfaction.     No Change. Colonoscopy per plan.  The risks, benefits, limitations, alternatives and imponderables have been reviewed with the patient. Questions have been answered. All parties are agreeable.   Eula Listenobert Zyir Gassert

## 2013-11-12 NOTE — Discharge Instructions (Addendum)
Katie Briggs Kitchen. Colonoscopy Discharge Instructions  Read the instructions outlined below and refer to this sheet in the next few weeks. These discharge instructions provide you with general information on caring for yourself after you leave the hospital. Your doctor may also give you specific instructions. While your treatment has been planned according to the most current medical practices available, unavoidable complications occasionally occur. If you have any problems or questions after discharge, call Dr. Jena Gaussourk at [phone]. ACTIVITY  You may resume your regular activity, but move at a slower pace for the next 24 hours.   Take frequent rest periods for the next 24 hours.   Walking will help get rid of the air and reduce the bloated feeling in your belly (abdomen).   No driving for 24 hours (because of the medicine (anesthesia) used during the test).    Do not sign any important legal documents or operate any machinery for 24 hours (because of the anesthesia used during the test).  NUTRITION  Drink plenty of fluids.   You may resume your normal diet as instructed by your doctor.   Begin with a light meal and progress to your normal diet. Heavy or fried foods are harder to digest and may make you feel sick to your stomach (nauseated).   Avoid alcoholic beverages for 24 hours or as instructed.  MEDICATIONS  You may resume your normal medications unless your doctor tells you otherwise.  WHAT YOU CAN EXPECT TODAY  Some feelings of bloating in the abdomen.   Passage of more gas than usual.   Spotting of blood in your stool or on the toilet paper.  IF YOU HAD POLYPS REMOVED DURING THE COLONOSCOPY:  No aspirin products for 7 days or as instructed.   No alcohol for 7 days or as instructed.   Eat a soft diet for the next 24 hours.  FINDING OUT THE RESULTS OF YOUR TEST Not all test results are available during your visit. If your test results are not back during the visit, make an appointment  with your caregiver to find out the results. Do not assume everything is normal if you have not heard from your caregiver or the medical facility. It is important for you to follow up on all of your test results.  SEEK IMMEDIATE MEDICAL ATTENTION IF:  You have more than a spotting of blood in your stool.   Your belly is swollen (abdominal distention).   You are nauseated or vomiting.   You have a temperature over 101.  You have abdominal pain or di.rmrscomfort that is severe or gets worse throughout the day.    Constipation and hemorrhoid information provided  Increase Linzess to 290 capsule daily  If rectal bleeding recurs, please let me know and we can set up hemorrhoid banding in the office   Hemorrhoids Hemorrhoids are puffy (swollen) veins around the rectum or anus. Hemorrhoids can cause pain, itching, bleeding, or irritation. HOME CARE  Eat foods with fiber, such as whole grains, beans, nuts, fruits, and vegetables. Ask your doctor about taking products with added fiber in them (fibersupplements).  Drink enough fluid to keep your pee (urine) clear or pale yellow.  Exercise often.  Go to the bathroom when you have the urge to poop. Do not wait.  Avoid straining to poop (bowel movement).  Keep the butt area dry and clean. Use wet toilet paper or moist paper towels.  Medicated creams and medicine inserted into the anus (anal suppository) may be used or applied  as told.  Only take medicine as told by your doctor.  Take a warm water bath (sitz bath) for 15 20 minutes to ease pain. Do this 3 4 times a day.  Place ice packs on the area if it is tender or puffy. Use the ice packs between the warm water baths.  Put ice in a plastic bag.  Place a towel between your skin and the bag.  Leave the ice on for 15-20 minutes, 03-04 times a day.  Do not use a donut-shaped pillow or sit on the toilet for a long time. GET HELP RIGHT AWAY IF:   You have more pain that is not  controlled by treatment or medicine.  You have bleeding that will not stop.  You have trouble or are unable to poop (bowel movement).  You have pain or puffiness outside the area of the hemorrhoids. MAKE SURE YOU:   Understand these instructions.  Will watch your condition.  Will get help right away if you are not doing well or get worse. Document Released: 07/31/2008 Document Revised: 10/08/2012 Document Reviewed: 09/02/2012 [facility] Patient Information 2014 [place], [place]. Constipation, Adult Constipation is when a person:  Poops (bowel movement) less than 3 times a week.  Has a hard time pooping.  Has poop that is dry, hard, or bigger than normal. HOME CARE   Eat more fiber, such as fruits, vegetables, whole grains like brown rice, and beans.  Eat less fatty foods and sugar. This includes [place] fries, hamburgers, cookies, candy, and soda.  If you are not getting enough fiber from food, take products with added fiber in them (supplements).  Drink enough fluid to keep your pee (urine) clear or pale yellow.  Go to the restroom when you feel like you need to poop. Do not hold it.  Only take medicine as told by your doctor. Do not take medicines that help you poop (laxatives) without talking to your doctor first.  Exercise on a regular basis, or as told by your doctor. GET HELP RIGHT AWAY IF:   You have bright red blood in your poop (stool).  Your constipation lasts more than 4 days or gets worse.  You have belly (abdomen) or butt (rectal) pain.  You have thin poop (as thin as a pencil).  You lose weight, and it cannot be explained. MAKE SURE YOU:   Understand these instructions.  Will watch your condition.  Will get help right away if you are not doing well or get worse. Document Released: 04/09/2008 Document Revised: 01/14/2012 Document Reviewed: 09/25/2011 [facility] Patient Information 2014 [place], [place].

## 2013-11-12 NOTE — Interval H&P Note (Signed)
History and Physical Interval Note:  11/12/2013 2:32 PM  Katie Briggs  has presented today for surgery, with the diagnosis of Constipation  The various methods of treatment have been discussed with the patient and family. After consideration of risks, benefits and other options for treatment, the patient has consented to  Procedure(s) with comments: COLONOSCOPY (N/A) - 2:40 as a surgical intervention .  The patient's history has been reviewed, patient examined, no change in status, stable for surgery.  I have reviewed the patient's chart and labs.  Questions were answered to the patient's satisfaction.    No change. Colonoscopy per plan.The risks, benefits, limitations, alternatives and imponderables have been reviewed with the patient. Questions have been answered. All parties are agreeable.   Katie Briggs

## 2013-11-12 NOTE — H&P (View-Only) (Signed)
Primary Care Physician:  Default, Provider, MD Primary Gastroenterologist:  Dr. Jena Gauss   Chief Complaint  Patient presents with  . Rectal Bleeding    HPI:   Katie Briggs presents today at the referral of the ED secondary to hematochezia. States she had urge to have BM, soft bowel movement, saw bright red blood in toilet and on tissue. Wiped vagina area but had no bleeding from this area. Occurred again 24 hours later. Felt uncomfortable in rectum. Lately stomach has been "gurgly", upset. Dealing more with constipation. Has had one incident where she had severe abdominal pain, unable to have BM, tried to go but couldn't. Finally was able to go and felt better. No nausea, vomiting. No upper GI symptoms. Denies any rectal discomfort or itching currently.   Past Medical History  Diagnosis Date  . Asthma   . Depression   . OCD (obsessive compulsive disorder)   . Bipolar disorder     Past Surgical History  Procedure Laterality Date  . None      Current Outpatient Prescriptions  Medication Sig Dispense Refill  . albuterol (PROVENTIL HFA;VENTOLIN HFA) 108 (90 BASE) MCG/ACT inhaler Inhale 2 puffs into the lungs every 6 (six) hours as needed.      . citalopram (CELEXA) 20 MG tablet Take 20 mg by mouth daily.      Marland Kitchen etonogestrel (IMPLANON) 68 MG IMPL implant Inject 1 each (68 mg total) into the skin once. For contraception  1 each  0  . risperiDONE (RISPERDAL) 1 MG tablet Take 1 mg by mouth 2 (two) times daily.      . hydrocortisone (ANUSOL-HC) 2.5 % rectal cream Place 1 application rectally 2 (two) times daily.  30 g  1  . Linaclotide (LINZESS) 145 MCG CAPS capsule Take 1 capsule (145 mcg total) by mouth daily. 30 minutes before breakfast  30 capsule  3   No current facility-administered medications for this visit.    Allergies as of 10/28/2013  . (No Known Allergies)    Family History  Problem Relation Age of Onset  . Colon polyps Maternal Grandmother   . Colon cancer        great grandmother    History   Social History  . Marital Status: Married    Spouse Name: N/A    Number of Children: 2  . Years of Education: N/A   Occupational History  . Not on file.   Social History Main Topics  . Smoking status: Former Games developer  . Smokeless tobacco: Not on file  . Alcohol Use: No  . Drug Use: No  . Sexual Activity: Yes    Birth Control/ Protection: Implant   Other Topics Concern  . Not on file   Social History Narrative  . No narrative on file    Review of Systems: As mentioned in HPI.   Physical Exam: BP 116/70  Pulse 104  Temp(Src) 97.6 F (36.4 C) (Oral)  Wt 171 lb 6.4 oz (77.747 kg) General:   Alert and oriented. Pleasant and cooperative. Well-nourished and well-developed.  Head:  Normocephalic and atraumatic. Eyes:  Without icterus, sclera clear and conjunctiva pink.  Ears:  Normal auditory acuity. Nose:  No deformity, discharge,  or lesions. Mouth:  No deformity or lesions, oral mucosa pink.  Neck:  Supple, without mass or thyromegaly. Lungs:  Clear to auscultation bilaterally. No wheezes, rales, or rhonchi. No distress.  Heart:  S1, S2 present without murmurs appreciated.  Abdomen:  +BS,  soft, non-tender and non-distended. No HSM noted. No guarding or rebound. No masses appreciated.  Rectal:  Deferred  Msk:  Symmetrical without gross deformities. Normal posture. Extremities:  Without clubbing or edema. Neurologic:  Alert and  oriented x4;  grossly normal neurologically. Skin:  Intact without significant lesions or rashes. Cervical Nodes:  No significant cervical adenopathy. Psych:  Alert and cooperative. Normal mood and affect.  Lab Results  Component Value Date   WBC 7.6 10/08/2013   HGB 14.9 10/08/2013   HCT 44.2 10/08/2013   MCV 90.4 10/08/2013   PLT 255 10/08/2013

## 2013-11-12 NOTE — Op Note (Signed)
[facility] [address] [place], [id number]   COLONOSCOPY PROCEDURE REPORT  PATIENT: Katie Briggs, Katie R.  MR#:         [id number] BIRTHDATE: 06-18-1991 , 22  yrs. old GENDER: Female ENDOSCOPIST: Jonathon Bellows.  Michael Milayah Krell, MD FACP [facility] REFERRED BY: PROCEDURE DATE:  11/12/2013 PROCEDURE:     Ileocolonoscopy-diagnostic  INDICATIONS: hematochezia/constipation. Constipation - only somewhat improved with Linzess 145 daily  INFORMED CONSENT:  The risks, benefits, alternatives and imponderables including but not limited to bleeding, perforation as well as the possibility of a missed lesion have been reviewed.  The potential for biopsy, lesion removal, etc. have also been discussed.  Questions have been answered.  All parties agreeable. Please see the history and physical in the medical record for more information.  MEDICATIONS: Versed 7 mg IV and Demerol 125 mg IV. Zofran 4 mg IV.  DESCRIPTION OF PROCEDURE:  After a digital rectal exam was performed, the EC-[id number] ([id number]  colonoscope was advanced from the anus through the rectum and colon to the area of the cecum, ileocecal valve and appendiceal orifice.  The cecum was deeply intubated.  These structures were well-seen and photographed for the record.  From the level of the cecum and ileocecal valve, the scope was slowly and cautiously withdrawn.  The mucosal surfaces were carefully surveyed utilizing scope tip deflection to facilitate fold flattening as needed.  The scope was pulled down into the rectum where a thorough examination including retroflexion was performed.    FINDINGS:  Adequate preparation. Anal canal internal hemorrhoids; otherwise normal rectum. Redundant, but otherwise normal appearing, colon.  The distal 10 cm of terminal ileum mucosa also appeared normal.  THERAPEUTIC / DIAGNOSTIC MANEUVERS PERFORMED:  None  COMPLICATIONS: None  CECAL WITHDRAWAL TIME:  7 minutes  IMPRESSION:  internal  hemorrhoids-likely source of hematochezia otherwise, negative ileocolonoscopy  RECOMMENDATIONS: Increased Linzess to 290 - one capsule one daily. If bleeding recurs, we can set up hemorrhoidal banding in the office.  _______________________________ eSigned:  R. Roetta SessionsMichael Cortlynn Hollinsworth, MD FACP [facility] 11/12/2013 3:12 PM   CC:

## 2013-11-17 ENCOUNTER — Encounter (HOSPITAL_COMMUNITY): Payer: Self-pay | Admitting: Internal Medicine

## 2013-11-30 IMAGING — CT CT ABD-PELV W/O CM
2 of 3 series · 9 of 46 positions shown, 11 images · non-contrast
Comparison: None.

CLINICAL DATA: Bilateral flank pain

CT ABDOMEN AND PELVIS WITHOUT CONTRAST
TECHNIQUE: Multidetector CT imaging of the abdomen and pelvis was
performed following the standard protocol without intravenous
contrast.

[Series 4: mpr coronal (id) · coronal · 0.78mm/px · 8 of 88 slices shown, 9 images]
[im 10/88  soft-tissue]
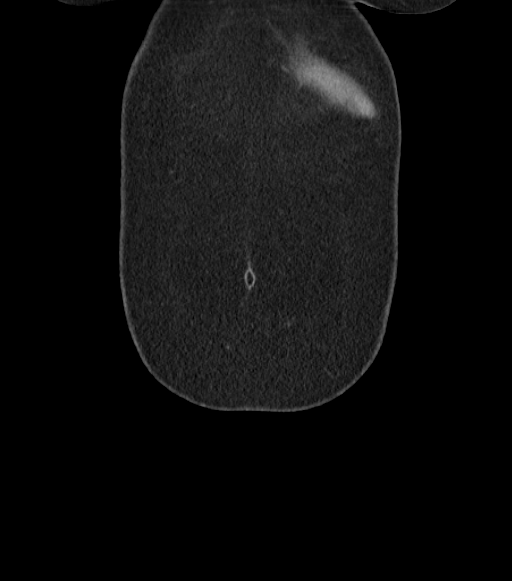
[im 10/88  bone]
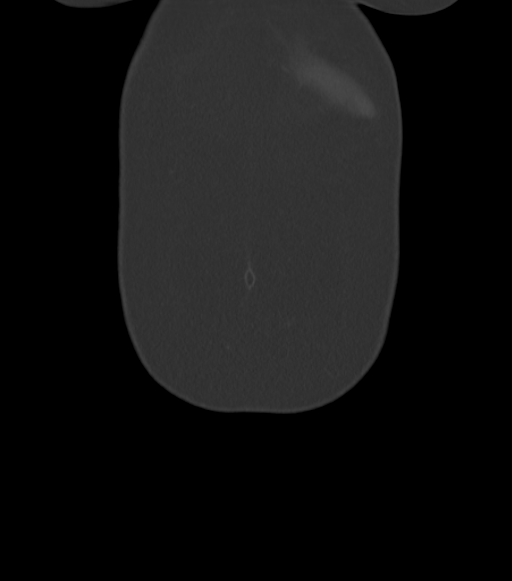
[im 20/88  soft-tissue]
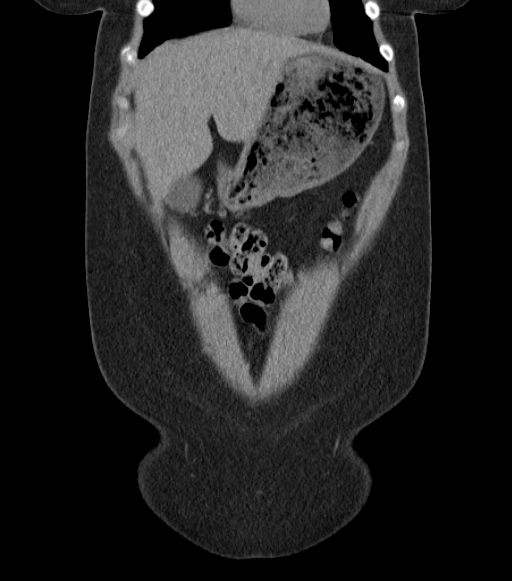
[im 30/88  soft-tissue]
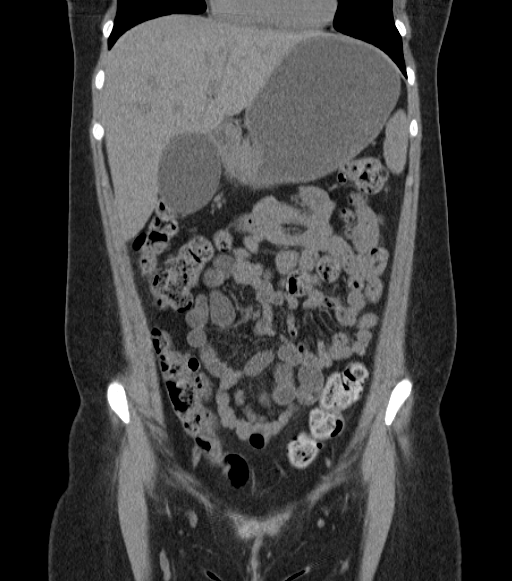
[im 39/88  soft-tissue]
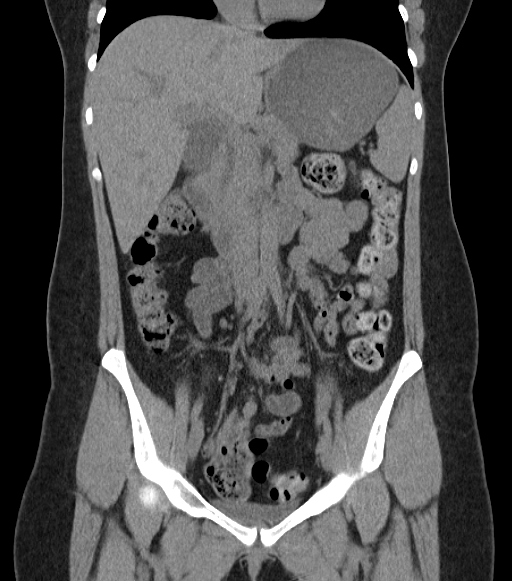
[im 49/88  soft-tissue]
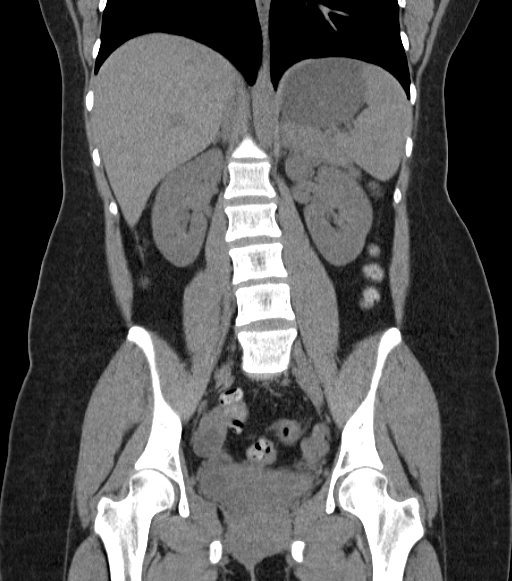
[im 59/88  soft-tissue]
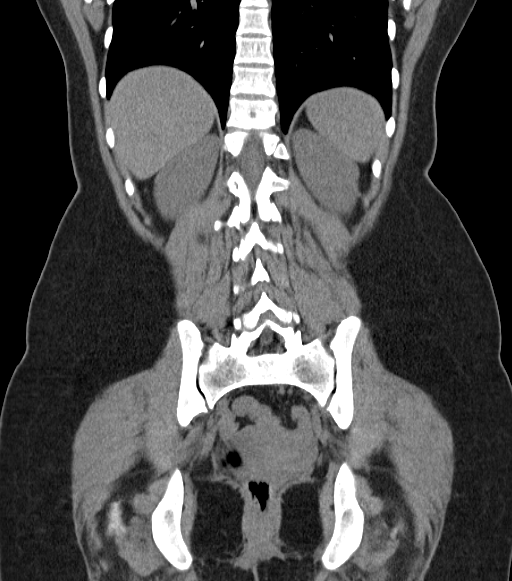
[im 68/88  soft-tissue]
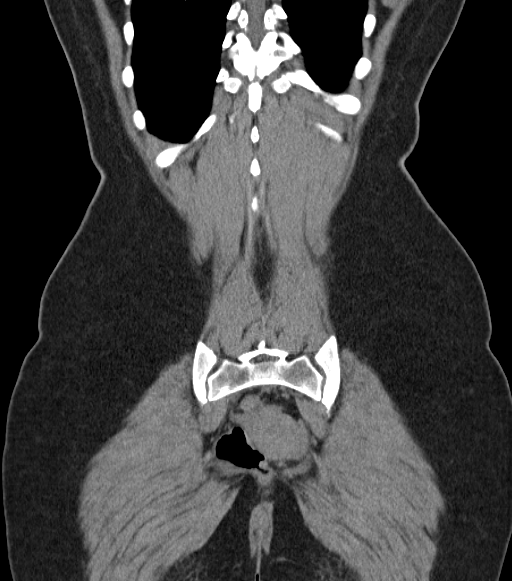
[im 78/88  soft-tissue]
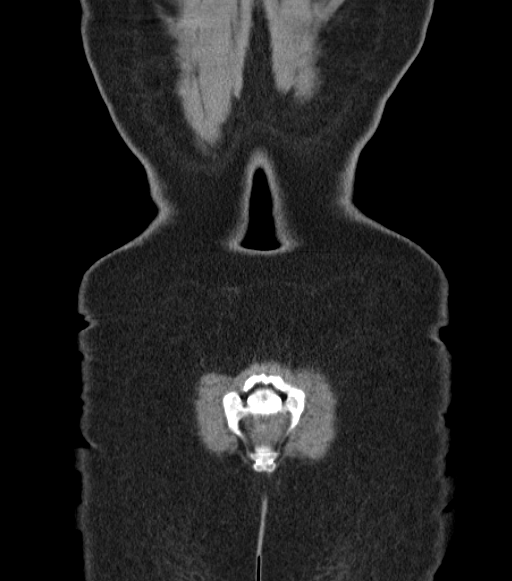

[Series 5: mpr sagittal (id) · sagittal · 0.57mm/px · 1 of 120 slices shown, 2 images]
[im 40/120  soft-tissue]
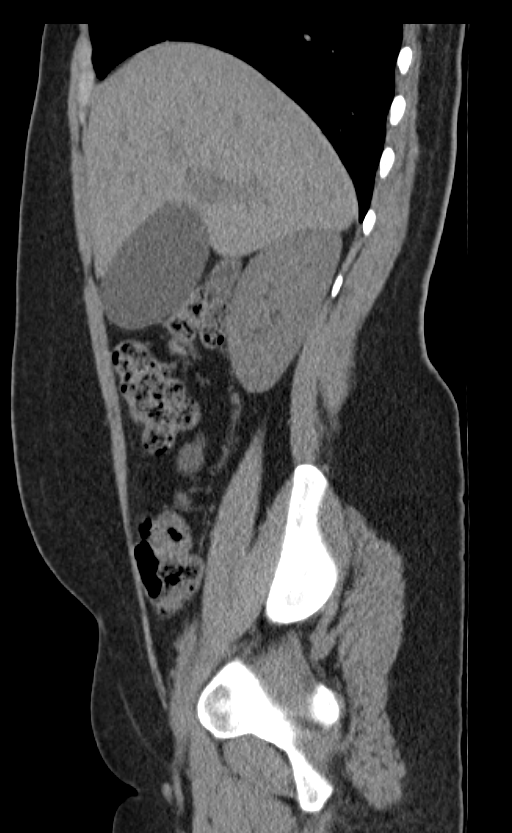
[im 40/120  bone]
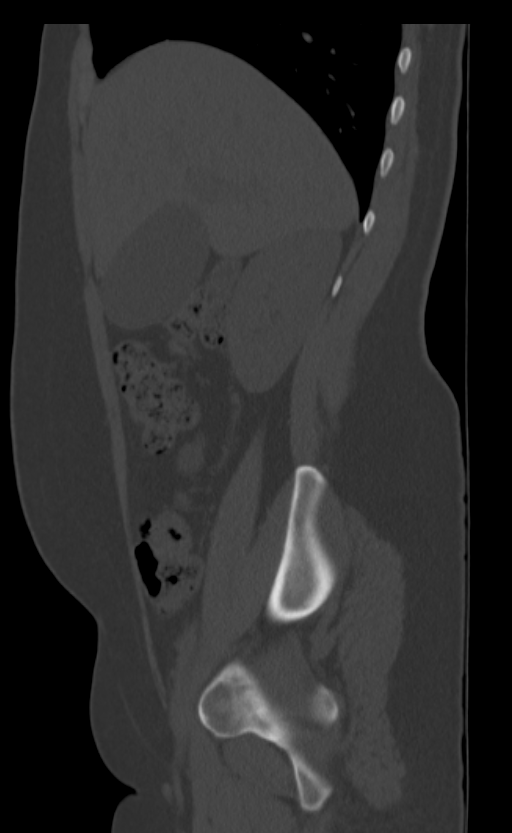

[9 of 46 positions shown; findings below may reference images not displayed]

FINDINGS: Lung bases are clear.  No pericardial fluid.  No focal
hepatic lesion.  Gallbladder is distended to 4.6 cm but within
normal limits.  No radio-opaque gallstones.  Common bile duct
appears normal diameter.  The pancreas, spleen, and adrenal glands
are normal.  No nephrolithiasis or ureterolithiasis.

The stomach, small bowel, appendix, and cecum are normal.  Colon
and rectosigmoid colon are normal.

Abdominal aorta is normal caliber.  No retroperitoneal periportal
lymphadenopathy.  No free fluid the pelvis.  The uterus and ovaries
appear normal.  Bladder is collapsed.  No pelvic lymphadenopathy.
Review of  bone windows demonstrates no aggressive osseous lesions.
IMPRESSION: 1.  No nephrolithiasis or ureterolithiasis.
2.  Normal appendix.

## 2014-01-10 ENCOUNTER — Emergency Department (HOSPITAL_COMMUNITY)
Admission: EM | Admit: 2014-01-10 | Discharge: 2014-01-10 | Disposition: A | Discharge status: Home / Self Care | Payer: Medicaid Other | Attending: Emergency Medicine | Admitting: Emergency Medicine

## 2014-01-10 ENCOUNTER — Encounter (HOSPITAL_COMMUNITY): Payer: Self-pay | Admitting: Emergency Medicine

## 2014-01-10 DIAGNOSIS — N907 Vulvar cyst: Secondary | ICD-10-CM

## 2014-01-10 DIAGNOSIS — Z79899 Other long term (current) drug therapy: Secondary | ICD-10-CM | POA: Insufficient documentation

## 2014-01-10 DIAGNOSIS — F429 Obsessive-compulsive disorder, unspecified: Secondary | ICD-10-CM | POA: Insufficient documentation

## 2014-01-10 DIAGNOSIS — J45909 Unspecified asthma, uncomplicated: Secondary | ICD-10-CM | POA: Insufficient documentation

## 2014-01-10 DIAGNOSIS — N9089 Other specified noninflammatory disorders of vulva and perineum: Secondary | ICD-10-CM | POA: Insufficient documentation

## 2014-01-10 DIAGNOSIS — F319 Bipolar disorder, unspecified: Secondary | ICD-10-CM | POA: Insufficient documentation

## 2014-01-10 DIAGNOSIS — Z87891 Personal history of nicotine dependence: Secondary | ICD-10-CM | POA: Insufficient documentation

## 2014-01-10 NOTE — ED Notes (Signed)
Patient is undressed from waist down and covered with a sheet lying in bed at this time. No complaints of pain at this time.

## 2014-01-10 NOTE — Discharge Instructions (Signed)
`   Your vital signs are well within normal limits. Your examination is consistent with suspected enlargement of glands within the internal labia, versus small cyst present in the internal labia. There is no evidence of infection at this time. Please use warm tub soaks daily until these cysts have improved. Please see the GYN specialist listed above if these are not improving.

## 2014-01-10 NOTE — ED Notes (Signed)
Patient reports noticed cyst to genital area near urethra earlier today. Denies any pain.

## 2014-01-10 NOTE — ED Provider Notes (Signed)
CSN: [id number]     Arrival date & time 01/10/14  2118 History   First MD Initiated Contact with Patient 01/10/14 2220     Chief Complaint  Patient presents with  . Cyst     (Consider location/radiation/quality/duration/timing/severity/associated sxs/prior Treatment) HPI Comments: Patient is a 23 year old female who presents to the emergency department with complaint of a cyst of the labial area. The patient states that she has prolapse of the internal organs at times, she frequently uses a mirror to examine herself for these issues. On today she noticed that there was a" cyst of the right labia. She states that this area does not hurt, it is not hot, but she does feel the cysts present. There's been no unusual swelling of the labia. There's been no vaginal discharge reported. No unusual vaginal bleeding. No fever or chills reported. The patient denies any new body washes or feminine products. She denies any injury or trauma to the labia. There's been no previous operations or procedures involving the labia. She requests additional evaluation of this issue.  The history is provided by the patient.    Past Medical History  Diagnosis Date  . Asthma   . Depression   . OCD (obsessive compulsive disorder)   . Bipolar disorder    Past Surgical History  Procedure Laterality Date  . None    . Colonoscopy N/A 11/12/2013    Procedure: COLONOSCOPY;  Surgeon: Corbin Ade, MD;  Location: [facility];  Service: Endoscopy;  Laterality: N/A;  2:40   Family History  Problem Relation Age of Onset  . Colon polyps Maternal Grandmother   . Colon cancer      great grandmother   History  Substance Use Topics  . Smoking status: Former Games developer  . Smokeless tobacco: Not on file  . Alcohol Use: No   OB History   Grav Para Term Preterm Abortions TAB SAB Ect Mult Living                 Review of Systems  Constitutional: Negative for activity change.       All ROS Neg except as noted in HPI  HENT:  Negative for nosebleeds.   Eyes: Negative for photophobia and discharge.  Respiratory: Negative for cough, shortness of breath and wheezing.   Cardiovascular: Negative for chest pain and palpitations.  Gastrointestinal: Negative for abdominal pain and blood in stool.  Genitourinary: Negative for dysuria, frequency, hematuria, vaginal bleeding, vaginal discharge and vaginal pain.  Musculoskeletal: Negative for arthralgias, back pain and neck pain.  Skin: Negative.   Neurological: Negative for dizziness, seizures and speech difficulty.  Psychiatric/Behavioral: Negative for hallucinations and confusion.       Depression      Allergies  Review of patient's allergies indicates no known allergies.  Home Medications   Current Outpatient Rx  Name  Route  Sig  Dispense  Refill  . albuterol (PROVENTIL HFA;VENTOLIN HFA) 108 (90 BASE) MCG/ACT inhaler   Inhalation   Inhale 2 puffs into the lungs every 6 (six) hours as needed.         . citalopram (CELEXA) 20 MG tablet   Oral   Take 20 mg by mouth daily.         Marland Kitchen etonogestrel (IMPLANON) 68 MG IMPL implant   Subcutaneous   Inject 1 each (68 mg total) into the skin once. For contraception   1 each   0   . Linaclotide (LINZESS) 145 MCG CAPS capsule  Oral   Take 1 capsule (145 mcg total) by mouth daily. 30 minutes before breakfast   30 capsule   3   . risperiDONE (RISPERDAL) 1 MG tablet   Oral   Take 1 mg by mouth 2 (two) times daily.          BP 100/66  Pulse 87  Temp(Src) 98.1 F (36.7 C) (Oral)  Resp 24  Ht 5\' 1"  (1.549 m)  Wt 165 lb (74.844 kg)  BMI 31.19 kg/m2  SpO2 100% Physical Exam  Nursing note and vitals reviewed. Constitutional: She is oriented to person, place, and time. She appears well-developed and well-nourished.  Non-toxic appearance.  HENT:  Head: Normocephalic.  Right Ear: Tympanic membrane and external ear normal.  Left Ear: Tympanic membrane and external ear normal.  Eyes: EOM and lids are  normal. Pupils are equal, round, and reactive to light.  Neck: Normal range of motion. Neck supple. Carotid bruit is not present.  Cardiovascular: Normal rate, regular rhythm, normal heart sounds, intact distal pulses and normal pulses.   Pulmonary/Chest: Breath sounds normal. No respiratory distress.  Abdominal: Soft. Bowel sounds are normal. There is no tenderness. There is no guarding.  Genitourinary:  Chaperone present during the examination. There is a small raised area less than the size of a pencil eraser on the internal aspect of the right internal labia. It is nontender, it is movable. There is no red streaks noted. There is a similar lesion in the lower portion of the internal labia on the left. There no cluster of lesions, no evidence of a wart, there is no drainage from these areas.  Musculoskeletal: Normal range of motion.  Lymphadenopathy:       Head (right side): No submandibular adenopathy present.       Head (left side): No submandibular adenopathy present.    She has no cervical adenopathy.  Neurological: She is alert and oriented to person, place, and time. She has normal strength. No cranial nerve deficit or sensory deficit.  Skin: Skin is warm and dry.  Psychiatric: She has a normal mood and affect. Her speech is normal.    ED Course  Procedures (including critical care time) Labs Review Labs Reviewed - No data to display Imaging Review No results found.   EKG Interpretation None      MDM Patient noted this is to the right labia today. She denies any recent changes in body wash or common and proximal. She denies shaving in the area. She denies any injury or trauma to the area. States she is never seen these before. She denies use of any spermicides.  Suspect that these are labial cysts, as they are nontender, no and show no evidence of infection. I've also discussed with the patient possibility of these being labial glands that may be engorged. I reassured the  patient that with normal vital signs and his examination that no emergent situation appears to be present at this time. I provided the patient to see the GYN specialist if this is not improving, or to return to the emergency department for additional evaluation. I've also asked the patient to use warm tub soaks until these have resolved.    Final diagnoses:  None    *I have reviewed nursing notes, vital signs, and all appropriate lab and imaging results for this patient.Kathie Dike**    Julieta Rogalski M Ettore Trebilcock, PA-C 01/10/14 2320

## 2014-01-11 NOTE — ED Provider Notes (Signed)
Medical screening examination/treatment/procedure(s) were performed by non-physician practitioner and as supervising physician I was immediately available for consultation/collaboration.  Ileana Chalupa T Gen Clagg, MD 01/11/14 1456 

## 2014-05-09 ENCOUNTER — Emergency Department (HOSPITAL_COMMUNITY)
Admission: EM | Admit: 2014-05-09 | Discharge: 2014-05-09 | Disposition: A | Discharge status: Home / Self Care | Payer: Medicaid Other | Attending: Emergency Medicine | Admitting: Emergency Medicine

## 2014-05-09 ENCOUNTER — Encounter (HOSPITAL_COMMUNITY): Payer: Self-pay | Admitting: Emergency Medicine

## 2014-05-09 DIAGNOSIS — Z792 Long term (current) use of antibiotics: Secondary | ICD-10-CM | POA: Insufficient documentation

## 2014-05-09 DIAGNOSIS — J45909 Unspecified asthma, uncomplicated: Secondary | ICD-10-CM | POA: Insufficient documentation

## 2014-05-09 DIAGNOSIS — F319 Bipolar disorder, unspecified: Secondary | ICD-10-CM | POA: Insufficient documentation

## 2014-05-09 DIAGNOSIS — J029 Acute pharyngitis, unspecified: Secondary | ICD-10-CM | POA: Insufficient documentation

## 2014-05-09 DIAGNOSIS — Z79899 Other long term (current) drug therapy: Secondary | ICD-10-CM | POA: Insufficient documentation

## 2014-05-09 MED ORDER — AMOXICILLIN 250 MG/5ML PO SUSR
500.0000 mg | Freq: Once | ORAL | Status: AC
  Administered 2014-05-09: 500 mg via ORAL
  Filled 2014-05-09: qty 10

## 2014-05-09 MED ORDER — AMOXICILLIN 500 MG PO CAPS: 500.0000 mg | ORAL_CAPSULE | Freq: Three times a day (TID) | ORAL | Status: DC

## 2014-05-09 MED ORDER — IBUPROFEN 100 MG/5ML PO SUSP
400.0000 mg | Freq: Once | ORAL | Status: AC
  Administered 2014-05-09: 400 mg via ORAL
  Filled 2014-05-09: qty 20

## 2014-05-09 NOTE — ED Provider Notes (Signed)
CSN: [id number]     Arrival date & time 05/09/14  1943 History   First MD Initiated Contact with Patient 05/09/14 2030     Chief Complaint  Patient presents with  . Sore Throat     (Consider location/radiation/quality/duration/timing/severity/associated sxs/prior Treatment) Patient is a 23 y.o. female presenting with pharyngitis. The history is provided by the patient.  Sore Throat This is a new problem. The current episode started today. The problem occurs constantly. The problem has been gradually worsening. Associated symptoms include a sore throat. Pertinent negatives include no abdominal pain, arthralgias, chest pain, coughing, fever, headaches or neck pain. The symptoms are aggravated by swallowing. She has tried nothing for the symptoms. The treatment provided no relief.    Past Medical History  Diagnosis Date  . Asthma   . Depression   . OCD (obsessive compulsive disorder)   . Bipolar disorder    Past Surgical History  Procedure Laterality Date  . None    . Colonoscopy N/A 11/12/2013    Procedure: COLONOSCOPY;  Surgeon: Corbin Adeobert M Rourk, MD;  [facility];  Service: Endoscopy;  Laterality: N/A;  2:40   Family History  Problem Relation Age of Onset  . Colon polyps Maternal Grandmother   . Colon cancer      great grandmother   History  Substance Use Topics  . Smoking status: Former Games developermoker  . Smokeless tobacco: Not on file  . Alcohol Use: No   OB History   Grav Para Term Preterm Abortions TAB SAB Ect Mult Living                 Review of Systems  Constitutional: Negative for fever and activity change.       All ROS Neg except as noted in HPI  HENT: Positive for sore throat and voice change. Negative for nosebleeds.   Eyes: Negative for photophobia and discharge.  Respiratory: Negative for cough, shortness of breath and wheezing.   Cardiovascular: Negative for chest pain and palpitations.  Gastrointestinal: Negative for abdominal pain and blood in stool.   Genitourinary: Negative for dysuria, frequency and hematuria.  Musculoskeletal: Negative for arthralgias, back pain and neck pain.  Skin: Negative.   Neurological: Negative for dizziness, seizures, speech difficulty and headaches.  Psychiatric/Behavioral: Negative for hallucinations and confusion.      Allergies  Review of patient's allergies indicates no known allergies.  Home Medications   Prior to Admission medications   Medication Sig Start Date End Date Taking? Authorizing Provider  albuterol (PROVENTIL HFA;VENTOLIN HFA) 108 (90 BASE) MCG/ACT inhaler Inhale 2 puffs into the lungs every 6 (six) hours as needed.    Historical Provider, MD  amoxicillin (AMOXIL) 500 MG capsule Take 1 capsule (500 mg total) by mouth 3 (three) times daily. 05/09/14   Kathie DikeHobson M Halen Antenucci, PA-C  citalopram (CELEXA) 20 MG tablet Take 20 mg by mouth daily.    Historical Provider, MD  etonogestrel (IMPLANON) 68 MG IMPL implant Inject 1 each (68 mg total) into the skin once. For contraception 03/10/12   Mike CrazeEdwin O Walker, MD  Linaclotide [facility](LINZESS) 145 MCG CAPS capsule Take 1 capsule (145 mcg total) by mouth daily. 30 minutes before breakfast 10/28/13   Nira RetortAnna W Sams, NP  risperiDONE (RISPERDAL) 1 MG tablet Take 1 mg by mouth 2 (two) times daily.    Historical Provider, MD   BP 105/63  Pulse 89  Temp(Src) 98.6 F (37 C) (Oral)  Resp 16  SpO2 99% Physical Exam  Nursing note  and vitals reviewed. Constitutional: She is oriented to person, place, and time. She appears well-developed and well-nourished.  Non-toxic appearance.  HENT:  Head: Normocephalic.  Right Ear: Tympanic membrane and external ear normal.  Left Ear: Tympanic membrane and external ear normal.  Mouth/Throat: Uvula is midline. Uvula swelling present. Posterior oropharyngeal erythema present.  Eyes: EOM and lids are normal. Pupils are equal, round, and reactive to light.  Neck: Normal range of motion. Neck supple. Carotid bruit is not present.   Cardiovascular: Normal rate, regular rhythm, normal heart sounds, intact distal pulses and normal pulses.   Pulmonary/Chest: Breath sounds normal. No respiratory distress.  Abdominal: Soft. Bowel sounds are normal. There is no tenderness. There is no guarding.  Musculoskeletal: Normal range of motion.  Lymphadenopathy:       Head (right side): No submandibular adenopathy present.       Head (left side): No submandibular adenopathy present.    She has no cervical adenopathy.  Neurological: She is alert and oriented to person, place, and time. She has normal strength. No cranial nerve deficit or sensory deficit.  Skin: Skin is warm and dry.  Psychiatric: She has a normal mood and affect. Her speech is normal.    ED Course  Procedures (including critical care time) Labs Review Labs Reviewed - No data to display  Imaging Review No results found.   EKG Interpretation None      MDM Examination is consistent with acute pharyngitis. Vital signs are well within normal limits. Pulse oximetry is 99% on room air. Within normal limits by my interpretation. The patient is given a prescription for Amoxil 3 times daily. I've advised her to use salt water gargles and Chloraseptic Spray frequently for comfort. She is also been advised to use ibuprofen every 6 hours for soreness. She was instructed to wash hands frequently and to not allow anyone to eat or drink with her eating utensils. Patient acknowledges understanding of these instructions. Work note given for the patient to return on Wednesday, July 8.    Final diagnoses:  Pharyngitis    *I have reviewed nursing notes, vital signs, and all appropriate lab and imaging results for this patient.Kathie Dike**    Abigael Mogle M Cameo Schmiesing, PA-C 05/09/14 2115

## 2014-05-09 NOTE — Discharge Instructions (Signed)
Please hands frequently. Please increase fluids. Salt water gargles and Chloraseptic Spray frequently maybe helpful. Please use Amoxil 3 times daily. Please use ibuprofen every 6 hours for pain or fever. Please see your primary physician, or return to the emergency department if any changes, problems, or concerns. Pharyngitis Pharyngitis is a sore throat (pharynx). There is redness, pain, and swelling of your throat. HOME CARE   Drink enough fluids to keep your pee (urine) clear or pale yellow.  Only take medicine as told by your doctor.  You may get sick again if you do not take medicine as told. Finish your medicines, even if you start to feel better.  Do not take aspirin.  Rest.  Rinse your mouth (gargle) with salt water ( tsp of salt per 1 qt of water) every 1-2 hours. This will help the pain.  If you are not at risk for choking, you can suck on hard candy or sore throat lozenges. GET HELP IF:  You have large, tender lumps on your neck.  You have a rash.  You cough up green, yellow-brown, or bloody spit. GET HELP RIGHT AWAY IF:   You have a stiff neck.  You drool or cannot swallow liquids.  You throw up (vomit) or are not able to keep medicine or liquids down.  You have very bad pain that does not go away with medicine.  You have problems breathing (not from a stuffy nose). MAKE SURE YOU:   Understand these instructions.  Will watch your condition.  Will get help right away if you are not doing well or get worse. Document Released: 04/09/2008 Document Revised: 08/12/2013 Document Reviewed: 06/29/2013 [facility] Patient Information 2015 [employer] [place]. This information is not intended to replace advice given to you by your health care provider. Make sure you discuss any questions you have with your health care provider.

## 2014-05-09 NOTE — ED Notes (Signed)
Pt. Reports sore throat starting this afternoon. Pt. Reports dry cough.

## 2014-05-11 NOTE — ED Provider Notes (Signed)
Medical screening examination/treatment/procedure(s) were performed by non-physician practitioner and as supervising physician I was immediately available for consultation/collaboration.   EKG Interpretation None        Adabella Stanis, MD 05/11/14 0713 

## 2014-05-26 ENCOUNTER — Encounter (HOSPITAL_COMMUNITY): Payer: Self-pay | Admitting: Emergency Medicine

## 2014-05-26 ENCOUNTER — Emergency Department (HOSPITAL_COMMUNITY)
Admission: EM | Admit: 2014-05-26 | Discharge: 2014-05-26 | Disposition: A | Discharge status: Home / Self Care | Payer: Medicaid Other | Attending: Emergency Medicine | Admitting: Emergency Medicine

## 2014-05-26 DIAGNOSIS — J45909 Unspecified asthma, uncomplicated: Secondary | ICD-10-CM | POA: Insufficient documentation

## 2014-05-26 DIAGNOSIS — F319 Bipolar disorder, unspecified: Secondary | ICD-10-CM | POA: Insufficient documentation

## 2014-05-26 DIAGNOSIS — J029 Acute pharyngitis, unspecified: Secondary | ICD-10-CM

## 2014-05-26 DIAGNOSIS — Z87891 Personal history of nicotine dependence: Secondary | ICD-10-CM | POA: Insufficient documentation

## 2014-05-26 DIAGNOSIS — Z792 Long term (current) use of antibiotics: Secondary | ICD-10-CM | POA: Insufficient documentation

## 2014-05-26 DIAGNOSIS — Z79899 Other long term (current) drug therapy: Secondary | ICD-10-CM | POA: Insufficient documentation

## 2014-05-26 DIAGNOSIS — F429 Obsessive-compulsive disorder, unspecified: Secondary | ICD-10-CM | POA: Insufficient documentation

## 2014-05-26 LAB — RAPID STREP SCREEN (MED CTR MEBANE ONLY): Streptococcus, Group A Screen (Direct): NEGATIVE

## 2014-05-26 MED ORDER — IBUPROFEN 800 MG PO TABS
800.0000 mg | ORAL_TABLET | Freq: Once | ORAL | Status: AC
  Administered 2014-05-26: 800 mg via ORAL
  Filled 2014-05-26: qty 1

## 2014-05-26 MED ORDER — ACETAMINOPHEN 325 MG PO TABS
650.0000 mg | ORAL_TABLET | Freq: Once | ORAL | Status: AC
  Administered 2014-05-26: 650 mg via ORAL
  Filled 2014-05-26: qty 2

## 2014-05-26 MED ORDER — NAPROXEN 500 MG PO TABS: 500.0000 mg | ORAL_TABLET | Freq: Two times a day (BID) | ORAL | Status: DC

## 2014-05-26 MED ORDER — LIDOCAINE VISCOUS 2 % MT SOLN
15.0000 mL | Freq: Once | OROMUCOSAL | Status: AC
  Administered 2014-05-26: 15 mL via OROMUCOSAL
  Filled 2014-05-26: qty 15

## 2014-05-26 NOTE — ED Provider Notes (Signed)
CSN: [id number]     Arrival date & time 05/26/14  0020 History  This chart was scribed for [facility] M. Danae OrleansBryant PA-C working with Vida [id number] D Miller, MD by Ashley JacobsBrittany Andrews, ED scribe. This patient was seen in room APA08/APA08 and the patient's care was started at 12:44 AM.   First MD Initiated Contact with Patient 05/26/14 0030     Chief Complaint  Patient presents with  . Mouth Lesions     (Consider location/radiation/quality/duration/timing/severity/associated sxs/prior Treatment) Patient is a 23 y.o. female presenting with mouth sores. The history is provided by the patient and medical records. No language interpreter was used.  Mouth Lesions Location:  Tongue and lower gingiva Onset quality:  Gradual Severity:  Mild Duration:  2 days Progression:  Worsening Chronicity:  New Context: not a change in diet   Relieved by:  Nothing Worsened by:  Drinking and eating Ineffective treatments:  None tried Associated symptoms: sore throat   Associated symptoms: no fever and no rash    HPI Comments: Katie KiefMalisa R Briggs is a 23 y.o. female who presents to the Emergency Department complaining of lesions the left side of her tongue, onset yesterday.  Pt has associated pain, onset today. She has a mild cough. Pt has throat soreness that is worse with swallowing.  Denies any other rash. Denies fever. Denies the use the of any new toothpaste or food.   Past Medical History  Diagnosis Date  . Asthma   . Depression   . OCD (obsessive compulsive disorder)   . Bipolar disorder    Past Surgical History  Procedure Laterality Date  . None    . Colonoscopy N/A 11/12/2013    Procedure: COLONOSCOPY;  Surgeon: Corbin Adeobert M Rourk, MD;  [facility];  Service: Endoscopy;  Laterality: N/A;  2:40   Family History  Problem Relation Age of Onset  . Colon polyps Maternal Grandmother   . Colon cancer      great grandmother   History  Substance Use Topics  . Smoking status: Former Games developermoker  . Smokeless  tobacco: Not on file  . Alcohol Use: No   OB History   Grav Para Term Preterm Abortions TAB SAB Ect Mult Living                 Review of Systems  Constitutional: Negative for fever.  HENT: Positive for mouth sores, sore throat and trouble swallowing.   Skin: Negative for rash.  All other systems reviewed and are negative.     Allergies  Review of patient's allergies indicates no known allergies.  Home Medications   Prior to Admission medications   Medication Sig Start Date End Date Taking? Authorizing Provider  albuterol (PROVENTIL HFA;VENTOLIN HFA) 108 (90 BASE) MCG/ACT inhaler Inhale 2 puffs into the lungs every 6 (six) hours as needed.    Historical Provider, MD  amoxicillin (AMOXIL) 500 MG capsule Take 1 capsule (500 mg total) by mouth 3 (three) times daily. 05/09/14   Kathie DikeHobson M Paz Winsett, PA-C  citalopram (CELEXA) 20 MG tablet Take 20 mg by mouth daily.    Historical Provider, MD  etonogestrel (IMPLANON) 68 MG IMPL implant Inject 1 each (68 mg total) into the skin once. For contraception 03/10/12   Mike CrazeEdwin O Walker, MD  Linaclotide [facility](LINZESS) 145 MCG CAPS capsule Take 1 capsule (145 mcg total) by mouth daily. 30 minutes before breakfast 10/28/13   Nira RetortAnna W Sams, NP  risperiDONE (RISPERDAL) 1 MG tablet Take 1 mg by mouth 2 (two) times  daily.    Historical Provider, MD   BP   Pulse 82  Temp(Src) 98.4 F (36.9 C) (Oral)  Resp 14  Ht 5' (1.524 m)  Wt 168 lb (76.204 kg)  BMI 32.81 kg/m2  SpO2 100% Physical Exam  Nursing note and vitals reviewed. Constitutional: She is oriented to person, place, and time. She appears well-developed and well-nourished.  HENT:  Head: Normocephalic.  Increased redness of the posterior pharynx.  No lesions to the tongue. Uvula swelling Submental nodes present.   Eyes: Pupils are equal, round, and reactive to light.  Neck: Normal range of motion.     Cardiovascular: Normal rate.   Pulmonary/Chest: Effort normal.  Lymphadenopathy:    She has no  cervical adenopathy.  Neurological: She is alert and oriented to person, place, and time.  Skin: Skin is warm and dry. She is not diaphoretic.    ED Course  Procedures (including critical care time) DIAGNOSTIC STUDIES: Oxygen Saturation is 100% on RA, normal by my interpretation.    COORDINATION OF CARE:  12:47 AM Discussed course of care with pt. Advised pt to wash hands to avoid spread of infection. Pt requests a strep test.  Pt understands and agrees.   Labs Review Labs Reviewed - No data to display  Imaging Review No results found.   EKG Interpretation None      MDM No lesions noted about the tongue at this time. There is increased redness of the posterior pharynx. The uvula is mild to moderately enlarged. There are a few submental nodes palpable. The temperature is 98.4. The remainder the vital signs are within normal limits as well. A rapid strep has been sent to the lab. The patient will be given Viscous Xylocaine to gargle to help with her discomfort. The patient's care will be continued by Dr. Hyacinth Meeker. Rapid strep pending.    Final diagnoses:  None    *I have reviewed nursing notes, vital signs, and all appropriate lab and imaging results for this patient.Kathie Dike, PA-C 05/26/14 [phone]

## 2014-05-26 NOTE — ED Provider Notes (Signed)
Patient presents with mouth sores on the left side of her mouth, has been present for 2 days, gradually worsening, does not hurt to swallow, denies fever. On exam the patient has some redness of the pharynx, no obvious lesions in her buccal mucosa or tongue, mild uvular swelling. No trismus torticollis and on my exam no obvious lymphadenopathy is present. Clear heart and lung sounds  Strep negative, likely viral pharyngitis, recommended followup with family doctor.  Medical screening examination/treatment/procedure(s) were conducted as a shared visit with non-physician practitioner(s) and myself.  I personally evaluated the patient during the encounter.  Clinical Impression:   Vida RollerBrian D Buddy Loeffelholz, MD 05/26/14 [phone]

## 2014-05-26 NOTE — Discharge Instructions (Signed)
Strep test is negative,  Naprosyn for pain, read attachments  [facility] Doctor List    Kari BaarsEdward Hawkins MD. Specialty: Pulmonary Disease Contact information: [address]  [address]  [place] [address]  [phone]   Syliva OvermanMargaret Simpson, MD. Specialty: [facility] Contact information: [address]  [place] [address]  [phone]   Lilyan PuntScott Luking, MD. Specialty: [facility] Contact information: [address]  [address]  [place] [address]  [phone]   Avon Gullyesfaye Fanta, MD Specialty: Internal Medicine Contact information: [address]  [place] [address]  [phone]   Catalina PizzaZach Hall, MD. Specialty: Internal Medicine Contact information: [address]  [place] [address]  [phone]   Butch PennyAngus Mcinnis, MD. Specialty: Family Medicine Contact information: [address]  [place] [address]  [phone]   John GiovanniStephen Knowlton, MD. Specialty: [facility] Contact information: [address]  [address]  [place] [address]  [phone]   Carylon Perchesoy Fagan, MD. Specialty: Internal Medicine Contact information: [address]  [address]  [place] [address]  [phone]

## 2014-05-26 NOTE — ED Notes (Addendum)
Patient complaining of sore mouth and throat only on the left side x 2 days. States "It feels like something sore is in my mouth." States she took tylenol at 2350 tonight prior to arrival to ED.

## 2014-05-28 LAB — CULTURE, GROUP A STREP

## 2014-06-25 ENCOUNTER — Encounter (HOSPITAL_COMMUNITY): Payer: Self-pay | Admitting: Emergency Medicine

## 2014-06-25 ENCOUNTER — Emergency Department (HOSPITAL_COMMUNITY)
Admission: EM | Admit: 2014-06-25 | Discharge: 2014-06-25 | Disposition: A | Discharge status: Home / Self Care | Payer: Medicaid Other | Attending: Emergency Medicine | Admitting: Emergency Medicine

## 2014-06-25 DIAGNOSIS — Z87891 Personal history of nicotine dependence: Secondary | ICD-10-CM | POA: Insufficient documentation

## 2014-06-25 DIAGNOSIS — J45909 Unspecified asthma, uncomplicated: Secondary | ICD-10-CM | POA: Insufficient documentation

## 2014-06-25 DIAGNOSIS — Z3201 Encounter for pregnancy test, result positive: Secondary | ICD-10-CM | POA: Insufficient documentation

## 2014-06-25 DIAGNOSIS — Z3490 Encounter for supervision of normal pregnancy, unspecified, unspecified trimester: Secondary | ICD-10-CM

## 2014-06-25 DIAGNOSIS — Z8659 Personal history of other mental and behavioral disorders: Secondary | ICD-10-CM | POA: Insufficient documentation

## 2014-06-25 DIAGNOSIS — Z3200 Encounter for pregnancy test, result unknown: Secondary | ICD-10-CM | POA: Insufficient documentation

## 2014-06-25 LAB — POC URINE PREG, ED: Preg Test, Ur: POSITIVE — AB

## 2014-06-25 LAB — HCG, QUANTITATIVE, PREGNANCY: hCG, Beta Chain, Quant, S: 88 m[IU]/mL — ABNORMAL HIGH (ref <5)

## 2014-06-25 NOTE — Discharge Instructions (Signed)
Your quantitative pregnancy tests is 88, consistent with about 2 weeks pregnancy. Please see the GYN physician listed above, or the OB/GYN physician of your choice for appropriate followup.

## 2014-06-25 NOTE — ED Provider Notes (Signed)
Medical screening examination/treatment/procedure(s) were performed by non-physician practitioner and as supervising physician I was immediately available for consultation/collaboration.     Amato Sevillano, MD 06/25/14 2236 

## 2014-06-25 NOTE — ED Notes (Signed)
Here for pregnancy test. 

## 2014-06-25 NOTE — ED Notes (Signed)
Patient with no complaints at this time. Respirations even and unlabored. Skin warm/dry. Discharge instructions reviewed with patient at this time. Patient given opportunity to voice concerns/ask questions. Patient discharged at this time and left Emergency Department with steady gait.   

## 2014-06-25 NOTE — ED Provider Notes (Signed)
CSN: [id number]     Arrival date & time 06/25/14  1540 History   First MD Initiated Contact with Patient 06/25/14 1724     Chief Complaint  Patient presents with  . Possible Pregnancy     (Consider location/radiation/quality/duration/timing/severity/associated sxs/prior Treatment) HPI Comments: The patient states that she had 2 positive pregnancy test at home. She states however that she cannot use any Medicaid benefits until her pregnancy is verified by a physician. She states that she cannot see her GYN physician until she is at least [redacted] weeks pregnant. She states that she could not see the physician at the health department until September. She did not want to wait that long he came to the emergency department for verification of her pregnancy. Last menstrual cycle was July 25.  Patient is a 23 y.o. female presenting with pregnancy problem. The history is provided by the patient.  Possible Pregnancy The problem has been unchanged. The problem occurs constantly. Episode is mild. Gestational age is unknown. Patient reports no abdominal pain, no contractions, no leaking fluid, no vaginal bleeding and no vaginal discharge. There has been no prenatal care.  Associated symptoms include no dysuria, no fever, no malaise, no nausea, no shortness of breath, no syncope, no vomiting and no weakness. Prior pregnancy complications do not include history of gestational diabetes.     Past Medical History  Diagnosis Date  . Asthma   . Depression   . OCD (obsessive compulsive disorder)   . Bipolar disorder    Past Surgical History  Procedure Laterality Date  . None    . Colonoscopy N/A 11/12/2013    Procedure: COLONOSCOPY;  Surgeon: Corbin Adeobert M Rourk, MD;  [facility];  Service: Endoscopy;  Laterality: N/A;  2:40   Family History  Problem Relation Age of Onset  . Colon polyps Maternal Grandmother   . Colon cancer      great grandmother   History  Substance Use Topics  . Smoking status:  Former Games developermoker  . Smokeless tobacco: Not on file  . Alcohol Use: No   OB History   Grav Para Term Preterm Abortions TAB SAB Ect Mult Living   1              Review of Systems  Constitutional: Negative for fever and activity change.       All ROS Neg except as noted in HPI  Eyes: Negative for photophobia and discharge.  Respiratory: Negative for cough, shortness of breath and wheezing.   Cardiovascular: Negative for chest pain, palpitations and syncope.  Gastrointestinal: Negative for nausea, vomiting, abdominal pain and blood in stool.  Genitourinary: Negative for dysuria, frequency, hematuria, vaginal bleeding and vaginal discharge.  Musculoskeletal: Negative for arthralgias, back pain and neck pain.  Skin: Negative.   Neurological: Negative for dizziness, seizures, speech difficulty and weakness.  Psychiatric/Behavioral: Negative for hallucinations and confusion.      Allergies  Review of patient's allergies indicates no known allergies.  Home Medications   Prior to Admission medications   Medication Sig Start Date End Date Taking? Authorizing Provider  albuterol (PROVENTIL HFA;VENTOLIN HFA) 108 (90 BASE) MCG/ACT inhaler Inhale 2 puffs into the lungs every 6 (six) hours as needed.   Yes Historical Provider, MD   BP 116/78  Pulse 85  Temp(Src) 98.9 F (37.2 C) (Oral)  Resp 16  Ht 5' 0.75" (1.543 m)  Wt 163 lb (73.936 kg)  BMI 31.05 kg/m2  SpO2 98%  LMP 05/31/2014 Physical Exam  Nursing  note and vitals reviewed. Constitutional: She is oriented to person, place, and time. She appears well-developed and well-nourished.  Non-toxic appearance.  HENT:  Head: Normocephalic.  Right Ear: Tympanic membrane and external ear normal.  Left Ear: Tympanic membrane and external ear normal.  Eyes: EOM and lids are normal. Pupils are equal, round, and reactive to light.  Neck: Normal range of motion. Neck supple. Carotid bruit is not present.  Cardiovascular: Normal rate, regular  rhythm, normal heart sounds, intact distal pulses and normal pulses.   Pulmonary/Chest: Breath sounds normal. No respiratory distress.  Abdominal: Soft. Bowel sounds are normal. There is no tenderness. There is no guarding.  Musculoskeletal: Normal range of motion.  Lymphadenopathy:       Head (right side): No submandibular adenopathy present.       Head (left side): No submandibular adenopathy present.    She has no cervical adenopathy.  Neurological: She is alert and oriented to person, place, and time. She has normal strength. No cranial nerve deficit or sensory deficit.  Skin: Skin is warm and dry.  Psychiatric: She has a normal mood and affect. Her speech is normal.    ED Course  Procedures (including critical care time) Labs Review Labs Reviewed  POC URINE PREG, ED - Abnormal; Notable for the following:    Preg Test, Ur POSITIVE (*)    All other components within normal limits  HCG, QUANTITATIVE, PREGNANCY    Imaging Review No results found.   EKG Interpretation None      MDM Patient states that she cannot get Medicaid benefits to her pregnancy is verified by a physician. She states that she cannot see her GYN physician until she is at least [redacted] weeks pregnant. She states she cannot see the physicians at the health department until September. She presented to the emergency department for confirmation of pregnancy.  Urine pregnancy was faintly positive.  Quant. Elevated at 88. Pt made aware of lab results.   Final diagnoses:  None    *I have reviewed nursing notes, vital signs, and all appropriate lab and imaging results for this patient.Kathie Dike, PA-C 06/25/14 1753

## 2014-07-15 ENCOUNTER — Emergency Department (HOSPITAL_COMMUNITY)
Admission: EM | Admit: 2014-07-15 | Discharge: 2014-07-15 | Disposition: A | Discharge status: Home / Self Care | Payer: Medicaid Other | Attending: Emergency Medicine | Admitting: Emergency Medicine

## 2014-07-15 ENCOUNTER — Encounter (HOSPITAL_COMMUNITY): Payer: Self-pay | Admitting: Emergency Medicine

## 2014-07-15 DIAGNOSIS — N760 Acute vaginitis: Secondary | ICD-10-CM | POA: Diagnosis not present

## 2014-07-15 DIAGNOSIS — O9989 Other specified diseases and conditions complicating pregnancy, childbirth and the puerperium: Secondary | ICD-10-CM | POA: Insufficient documentation

## 2014-07-15 DIAGNOSIS — B9689 Other specified bacterial agents as the cause of diseases classified elsewhere: Secondary | ICD-10-CM | POA: Insufficient documentation

## 2014-07-15 DIAGNOSIS — O2390 Unspecified genitourinary tract infection in pregnancy, unspecified trimester: Secondary | ICD-10-CM | POA: Diagnosis not present

## 2014-07-15 DIAGNOSIS — Z87891 Personal history of nicotine dependence: Secondary | ICD-10-CM | POA: Insufficient documentation

## 2014-07-15 DIAGNOSIS — Z79899 Other long term (current) drug therapy: Secondary | ICD-10-CM | POA: Insufficient documentation

## 2014-07-15 DIAGNOSIS — O209 Hemorrhage in early pregnancy, unspecified: Secondary | ICD-10-CM | POA: Insufficient documentation

## 2014-07-15 DIAGNOSIS — A499 Bacterial infection, unspecified: Secondary | ICD-10-CM | POA: Insufficient documentation

## 2014-07-15 DIAGNOSIS — J45909 Unspecified asthma, uncomplicated: Secondary | ICD-10-CM | POA: Diagnosis not present

## 2014-07-15 DIAGNOSIS — Z8659 Personal history of other mental and behavioral disorders: Secondary | ICD-10-CM | POA: Diagnosis not present

## 2014-07-15 LAB — URINALYSIS, ROUTINE W REFLEX MICROSCOPIC
Bilirubin Urine: NEGATIVE
Glucose, UA: NEGATIVE mg/dL
Ketones, ur: NEGATIVE mg/dL
Nitrite: NEGATIVE
Protein, ur: NEGATIVE mg/dL
Specific Gravity, Urine: <1.005 — ABNORMAL LOW (ref 1.005–1.030)
Urobilinogen, UA: 0.2 mg/dL (ref 0.0–1.0)
pH: 5.5 (ref 5.0–8.0)

## 2014-07-15 LAB — URINE MICROSCOPIC-ADD ON

## 2014-07-15 LAB — CBC
HCT: 40.1 % (ref 36.0–46.0)
Hemoglobin: 13.8 g/dL (ref 12.0–15.0)
MCH: 30.9 pg (ref 26.0–34.0)
MCHC: 34.4 g/dL (ref 30.0–36.0)
MCV: 89.9 fL (ref 78.0–100.0)
Platelets: 258 10*3/uL (ref 150–400)
RBC: 4.46 MIL/uL (ref 3.87–5.11)
RDW: 13.4 % (ref 11.5–15.5)
WBC: 9.3 10*3/uL (ref 4.0–10.5)

## 2014-07-15 LAB — WET PREP, GENITAL
Trich, Wet Prep: NONE SEEN
Yeast Wet Prep HPF POC: NONE SEEN

## 2014-07-15 LAB — HCG, QUANTITATIVE, PREGNANCY: hCG, Beta Chain, Quant, S: 1422 m[IU]/mL — ABNORMAL HIGH (ref <5)

## 2014-07-15 LAB — ABO/RH: ABO/RH(D): O POS

## 2014-07-15 LAB — PREGNANCY, URINE: Preg Test, Ur: POSITIVE — AB

## 2014-07-15 MED ORDER — METRONIDAZOLE 500 MG PO TABS: 500.0000 mg | ORAL_TABLET | Freq: Two times a day (BID) | ORAL | Status: DC

## 2014-07-15 MED ORDER — PRENATAL COMPLETE 14-0.4 MG PO TABS: 1.0000 | ORAL_TABLET | Freq: Every day | ORAL | Status: DC

## 2014-07-15 NOTE — ED Notes (Signed)
Vaginal bleeding, onset 6 pm   With low back pain 4-[redacted] weeks pregnant.

## 2014-07-15 NOTE — Discharge Instructions (Signed)
°Emergency Department Resource Guide °1) Find a Doctor and Pay Out of Pocket °Although you won't have to find out who is covered by your insurance plan, it is a good idea to ask around and get recommendations. You will then need to call the office and see if the doctor you have chosen will accept you as a new patient and what types of options they offer for patients who are self-pay. Some doctors offer discounts or will set up payment plans for their patients who do not have insurance, but you will need to ask so you aren't surprised when you get to your appointment. ° °2) Contact Your Local Health Department °Not all health departments have doctors that can see patients for sick visits, but many do, so it is worth a call to see if yours does. If you don't know where your local health department is, you can check in your phone book. The CDC also has a tool to help you locate your state's health department, and many state websites also have listings of all of their local health departments. ° °3) Find a Walk-in Clinic °If your illness is not likely to be very severe or complicated, you may want to try a walk in clinic. These are popping up all over the country in pharmacies, drugstores, and shopping centers. They're usually staffed by nurse practitioners or physician assistants that have been trained to treat common illnesses and complaints. They're usually fairly quick and inexpensive. However, if you have serious medical issues or chronic medical problems, these are probably not your best option. ° °No Primary Care Doctor: °- Call [facility] at  832-8000 - they can help you locate a primary care doctor that  accepts your insurance, provides certain services, etc. °- Physician Referral Service- 1-800-533-3463 ° °Chronic Pain Problems: °Organization         Address  Phone   Notes  °[facility]  (336) 297-2271 Patients need to be referred by their primary care doctor.  ° °Medication  Assistance: °Organization         Address  Phone   Notes  °[place] [facility] [address] °[place], [place] [address] (336) 641-8030 --Must be a resident of [place] °-- Must have NO insurance coverage whatsoever (no Medicaid/ Medicare, etc.) °-- The pt. MUST have a primary care doctor that directs their care regularly and follows them in the community °  °[facility]  (866) 331-1348   °[employer]  (888) 892-1162   ° °Agencies that provide inexpensive medical care: °Organization         Address  Phone   Notes  °[facility]  (336) 832-8035   °[facility]    (336) 832-7272   °[facility] [address] °[place], [place] [address] (336) 832-4777   °[facility] [address], °[place] (336) 271-4999   °[facility]    (336) 373-0678   °[facility]    (336) 272-1050   °[facility] ° [address], [place] Phone:  (336) 832-4444, Fax:  (336) 832-4440 Hours of Operation:  9 am - 6 pm, M-F.  Also accepts Medicaid/Medicare and self-pay.  °[facility] ° [address], [place] Phone: (336) 832-3150, Fax: (336) 832-3151. Hours of Operation:  8:30 am - 5:30 pm, M-F.  Also accepts Medicaid and self-pay.  °[facility] [place] [address], [place] Phone: (336) 878-6027   °[facility] [address], [place], [place] (336)723-1848, Ext. 123 Mondays & Thursdays: 7-9 AM.  First 15 patients are seen on a first come, first serve basis. °  ° °Medicaid-accepting [place] Providers: ° °Organization         Address  Phone   Notes  °[facility] [address], [place] (336) 641-2100 Also accepts self-pay patients.  °[facility] [address], [place] ° (336) 856-9996   °[facility] [address], [place]  (336) 288-8857   °[facility] [address] [place] [address], [place] (336) 299-7000   °Veita Bland [address], [place]  ° (336) 373-1557 Only accepts [place] Access Medicaid patients after they have their name applied to their card.  ° °Self-Pay (no insurance) in [place]: ° °Organization         Address  Phone   Notes  °Sickle Cell Patients, [facility] [address], [place] (336) 832-1970   °[facility] [address], [place] (336) 832-4400   °[facility] ° [address], [place] (336) 992-4800   °[facility]/Dr. Osei-Bonsu ° [address] [place] [address], [place] or [address], [place] (336) 841-8500 Phone number for both [place] and [place] locations is the same.  °[facility] [address], [place] (336) 299-0000   °[facility] [address] [place] [address], [place] or [address] (336) 852-7530 °(336) 878-2260   °[facility] [address], [place] (336) 350-1642, phone; (336) 294-5005, fax Sees patients 1st and 3rd Saturday of every month.  Must not qualify for public or private insurance (i.e. Medicaid, Medicare, [place] Health Choice, Veterans' Benefits) • Household income should be no more than 200% of the poverty level •The clinic cannot treat you if you are pregnant or think you are pregnant • Sexually transmitted diseases are not treated at the clinic.  ° ° °Dental Care: °Organization         Address  Phone  Notes  °[place] [facility] [address], [place] (336) 641-6152 Accepts children up to age 21 who are enrolled in Medicaid or [place] Health Choice; pregnant women with a Medicaid card; and children who have applied for Medicaid or [place] Health Choice, but were declined, whose parents can pay a reduced fee at time of service.  °[place]  [facility] [place]  [address], [place] (336) 641-7733 Accepts children up to age 21 who are enrolled in Medicaid or [place] Health Choice; pregnant women with a Medicaid card; and children who have applied for Medicaid or [place] Health Choice, but were declined, whose parents can pay a reduced fee at time of service.  °[facility] ° [address], [place] (336) 641-4533 Patients are seen by appointment only. Walk-ins are not accepted. [facility] will see patients 18 years of age and older. °Monday - Tuesday (8am-5pm) °Most Wednesdays (8:30-5pm) °$30 per visit, cash only  °[facility] ° [address], [place] (336) 641-4533 Patients are seen by appointment only. Walk-ins are not accepted. [facility] will see patients 18 years of age and older. °One   Wednesday Evening (Monthly: Volunteer Based).  $30 per visit, cash only  °[facility]  (919) 537-3737 for adults; Children under age 4, call Graduate Pediatric Dentistry at ([phone]) [phone]. Children aged 4-14, please call ([phone]) [phone] to request a pediatric application. ° Dental services are provided in all areas of dental care including fillings, crowns and bridges, complete and partial dentures, implants, gum treatment, root canals, and extractions. Preventive care is also provided. Treatment is provided to both adults and children. °Patients are selected via a lottery and there is often a waiting list. °  °[facility] [address], °[place] ° (336) 763-8833 [email or web address] °  °[facility] [address], [place], [place] (336)723-1848, Ext. 123 Second and Fourth Thursday of each month, opens at 6:30 AM; Clinic ends at 9 AM.  Patients are seen on a first-come first-served basis, and a limited number are seen during each clinic.  ° °[facility] ° [address], [place], [place] (336) 723-7904    Eligibility Requirements °You must have lived in [place], or [place] counties for at least the last three months. °  You cannot be eligible for state or federal sponsored healthcare insurance, including Veterans Administration, Medicaid, or Medicare. °  You generally cannot be eligible for healthcare insurance through your employer.  °  How to apply: °Eligibility screenings are held every Tuesday and Wednesday afternoon from 1:00 pm until 4:00 pm. You do not need an appointment for the interview!  °[facility] [address], [place], [place] 336-631-2330   °[facility]  336-342-8273   °[facility]  336-703-3100   °[facility]  336-570-6415   ° °Behavioral Health Resources in the Community: °Intensive Outpatient Programs °Organization         Address  Phone  Notes  °[facility] [address], [place], [place] 336-878-6098   °[facility] [address], [place], [place] 336-832-9800   °[facility] [address], [place], [place] ° 336-882-2125   °[facility] [address],  °[place], [place] 1-800-853-5163 or 336-641-4981   °Substance Abuse Resources °Organization         Address  Phone  Notes  °[facility]  336-882-2125   °[facility]  336-784-9470   °[facility]  336-285-9073   °[facility]  336-845-3988   °Residential & Outpatient Substance Abuse Program  1-800-659-3381   °Psychological Services °Organization         Address  Phone  Notes  °[facility]  336- 832-9600   °[facility]  336- 378-7881   °[facility] [address], [place] 1-800-853-5163 or 336-641-4981   ° °Mobile Crisis Teams °Organization         Address  Phone  Notes  °[facility], Mobile Crisis Care Unit  1-877-626-1772   °Assertive °[facility] ° [address]  [place], [place] 336-834-9664   °Sharon DeEsch [address] °[place] 336-554-5454   ° °Self-Help/Support Groups °Organization         Address  Phone             Notes  °[facility]  - variety of support groups  336- 373-1402 Call for more information  °Narcotics Anonymous (NA), [facility] [address], °[place]  2 meetings at this location  ° °  Residential Treatment Programs Organization         Address  Phone  Notes  [facility] [address]    [place]  1-610-960-4540   [facility]  [address] [place] [address], [place], [place] [phone]   [facility] [address] [place], [place] 086-578-4696 Admissions: 8am-3pm M-F  [facility] [address]    [place], [place] 295-284-1324   [facility] [address] [place], [place], [place] 401-027-2536   [facility] [address]  [place], [place] 644-034-7425   [facility] Intensive Outpatient [address], [place], [place] 956-387-5643   [facility] [address] [place]  [place], [place] 3-295-188-4166 or 856-157-3899   [facility] [address], [place], [place] 323-557-3220 Accepts Medicaid  [facility] [address]  [place] 2-542-706-2376 Substance Abuse/Addiction Treatment   [facility]         Address  Phone  Notes  [facility]  708-717-5375   Angie Fava, PhD [address] [place], [place]   321 321 0292 or (862)793-9101   [facility]   [address] [place], [place] (548)605-2159   [facility] [address], [place], [place] 239-109-5640 Insurance/Medicaid/sponsorship through [facility] [address]                                    [place], [place] [phone] Therapy/tele-psych/case    [facility] [address], [place] ([phone]    Dr. Lolly Mustache  [phone]   [facility]  [employer] [facility] Dept. 1) [address] [place] 2) [address], [place] 3)  [address], [place] ([phone]) [phone] [phone]  [phone]   [facility] Child Abuse Hotline 934-068-6220 or 601-670-2205 (After Hours)       You will need a repeat of your "quantitative HCG" (blood test) in 48 hours to help determine the viability of your pregnancy.  Avoid strenuous activity and do NOT place anything into your vagina, ie: no douching, no tampons, no sexual intercourse, no swimming or tub baths, until you are seen in follow up by your regular OB/GYN doctor.  Call your regular OB/GYN doctor tomorrow morning to schedule a follow up appointment in 48 hours to recheck your quantitative HCG blood test.  Return to the Emergency Department immediately if worsening.

## 2014-07-15 NOTE — ED Provider Notes (Signed)
CSN: [id number]     Arrival date & time 07/15/14  1848 History   First MD Initiated Contact with Patient 07/15/14 2131     Chief Complaint  Patient presents with  . Vaginal Bleeding     HPI Pt was seen at 2140. Per pt, c/o sudden onset and resolution of one episode of "vaginal spotting" that occurred today approximately 1800. Pt states she went to the bathroom and when she wiped she saw a small amount of dark blood on the toilet paper and in the water. Pt has hx G3P2, LMP approximately 05/29/14. Pt states she was told by her OB/GYN at [facility] that her EDC was 03/06/15. Estimated EGA 6 4/7 weeks. Denies vaginal discharge, no pelvic pain, no flank pain, no dysuria/hematuria, no N/V/D, no fevers. The symptoms have been associated with no other complaints.    OB/GYN: Marilynne Drivers Past Medical History  Diagnosis Date  . Asthma   . Depression   . OCD (obsessive compulsive disorder)   . Bipolar disorder    Past Surgical History  Procedure Laterality Date  . None    . Colonoscopy N/A 11/12/2013    Procedure: COLONOSCOPY;  Surgeon: Corbin Ade, MD;  Location: [facility];  Service: Endoscopy;  Laterality: N/A;  2:40   Family History  Problem Relation Age of Onset  . Colon polyps Maternal Grandmother   . Colon cancer      great grandmother   History  Substance Use Topics  . Smoking status: Former Games developer  . Smokeless tobacco: Not on file  . Alcohol Use: No   OB History   Grav Para Term Preterm Abortions TAB SAB Ect Mult Living   3 2             Review of Systems ROS: Statement: All systems negative except as marked or noted in the HPI; Constitutional: Negative for fever and chills. ; ; Eyes: Negative for eye pain, redness and discharge. ; ; ENMT: Negative for ear pain, hoarseness, nasal congestion, sinus pressure and sore throat. ; ; Cardiovascular: Negative for chest pain, palpitations, diaphoresis, dyspnea and peripheral edema. ; ; Respiratory: Negative for cough, wheezing and stridor.  ; ; Gastrointestinal: Negative for nausea, vomiting, diarrhea, abdominal pain, blood in stool, hematemesis, jaundice and rectal bleeding. . ; ; Genitourinary: Negative for dysuria, flank pain and hematuria. ; ; GYN:  +vaginal spotting, no vaginal discharge, no vulvar pain, no pelvic pain. ;; Musculoskeletal: Negative for back pain and neck pain. Negative for swelling and trauma.; ; Skin: Negative for pruritus, rash, abrasions, blisters, bruising and skin lesion.; ; Neuro: Negative for headache, lightheadedness and neck stiffness. Negative for weakness, altered level of consciousness , altered mental status, extremity weakness, paresthesias, involuntary movement, seizure and syncope.      Allergies  Review of patient's allergies indicates no known allergies.  Home Medications   Prior to Admission medications   Medication Sig Start Date End Date Taking? Authorizing Provider  albuterol (PROVENTIL HFA;VENTOLIN HFA) 108 (90 BASE) MCG/ACT inhaler Inhale 2 puffs into the lungs every 6 (six) hours as needed.   Yes Historical Provider, MD   BP 98/64  Pulse 84  Temp(Src) 98.8 F (37.1 C) (Oral)  Resp 20  Ht  (1.549 m)  Wt 164 lb (74.39 kg)  BMI 31.00 kg/m2  SpO2 100%  LMP 05/31/2014 Physical Exam 2145: Physical examination:  Nursing notes reviewed; Vital signs and O2 SAT reviewed;  Constitutional: Well developed, Well nourished, Well hydrated, In no acute distress;  Head:  Normocephalic, atraumatic; Eyes: EOMI, PERRL, No scleral icterus; ENMT: Mouth and pharynx normal, Mucous membranes moist; Neck: Supple, Full range of motion, No lymphadenopathy; Cardiovascular: Regular rate and rhythm, No murmur, rub, or gallop; Respiratory: Breath sounds clear & equal bilaterally, No rales, rhonchi, wheezes.  Speaking full sentences with ease, Normal respiratory effort/excursion; Chest: Nontender, Movement normal; Abdomen: Soft, Nontender, Nondistended, Normal bowel sounds; Genitourinary: No CVA tenderness.  Pelvic exam performed with permission of pt and female ED tech assist during exam.  External genitalia w/o lesions. Vaginal vault with yellow-white discharge, scant amount of red blood on cervix.  Cervix w/o lesions, not friable, os closed. No active bleeding from os. GC/chlam and wet prep obtained and sent to lab.  Bimanual exam w/o CMT, uterine or adnexal tenderness.;; Extremities: Pulses normal, No tenderness, No edema, No calf edema or asymmetry.; Neuro: AA&Ox3, Major CN grossly intact.  Speech clear. No gross focal motor or sensory deficits in extremities. Climbs on and off stretcher easily by herself. Gait steady.; Skin: Color normal, Warm, Dry.   ED Course  Procedures     MDM  MDM Reviewed: previous chart, nursing note and vitals Reviewed previous: labs Interpretation: labs    Results for orders placed during the hospital encounter of 07/15/14  WET PREP, GENITAL      Result Value Ref Range   Yeast Wet Prep HPF POC NONE SEEN  NONE SEEN   Trich, Wet Prep NONE SEEN  NONE SEEN   Clue Cells Wet Prep HPF POC FEW (*) NONE SEEN   WBC, Wet Prep HPF POC FEW (*) NONE SEEN  CBC      Result Value Ref Range   WBC 9.3  4.0 - 10.5 K/uL   RBC 4.46  3.87 - 5.11 MIL/uL   Hemoglobin 13.8  12.0 - 15.0 g/dL   HCT 16.1  09.6 - 04.5 %   MCV 89.9  78.0 - 100.0 fL   MCH 30.9  26.0 - 34.0 pg   MCHC 34.4  30.0 - 36.0 g/dL   RDW 40.9  81.1 - 91.4 %   Platelets 258  150 - 400 K/uL  HCG, QUANTITATIVE, PREGNANCY      Result Value Ref Range   hCG, Beta Chain, Quant, S 1422 (*) <5 mIU/mL  PREGNANCY, URINE      Result Value Ref Range   Preg Test, Ur POSITIVE (*) NEGATIVE  URINALYSIS, ROUTINE W REFLEX MICROSCOPIC      Result Value Ref Range   Color, Urine YELLOW  YELLOW   APPearance CLEAR  CLEAR   Specific Gravity, Urine <1.005 (*) 1.005 - 1.030   pH 5.5  5.0 - 8.0   Glucose, UA NEGATIVE  NEGATIVE mg/dL   Hgb urine dipstick TRACE (*) NEGATIVE   Bilirubin Urine NEGATIVE  NEGATIVE   Ketones, ur  NEGATIVE  NEGATIVE mg/dL   Protein, ur NEGATIVE  NEGATIVE mg/dL   Urobilinogen, UA 0.2  0.0 - 1.0 mg/dL   Nitrite NEGATIVE  NEGATIVE   Leukocytes, UA SMALL (*) NEGATIVE  URINE MICROSCOPIC-ADD ON      Result Value Ref Range   Squamous Epithelial / LPF FEW (*) RARE   WBC, UA 3-6  <3 WBC/hpf   RBC / HPF 0-2  <3 RBC/hpf   Bacteria, UA RARE  RARE  ABO/RH      Result Value Ref Range   ABO/RH(D) O POS      2330:  B-quant higher today than previous. Abd/pelvic exam benign; doubt ectopic at  this time. Will tx for BV. Pt encouraged to f/u with OB/GYN in 48 hours for B-quant re-check; pelvic rest precautions given. Pt verb understanding. Pt wants to go home now. Dx and testing d/w pt and family.  Questions answered.  Verb understanding, agreeable to d/c home with outpt f/u.      Samuel Jester, DO 07/18/14 [phone]

## 2014-07-17 ENCOUNTER — Encounter (HOSPITAL_COMMUNITY): Payer: Self-pay | Admitting: *Deleted

## 2014-07-17 ENCOUNTER — Inpatient Hospital Stay (HOSPITAL_COMMUNITY): Payer: Medicaid Other

## 2014-07-17 ENCOUNTER — Inpatient Hospital Stay (HOSPITAL_COMMUNITY)
Admission: AD | Admit: 2014-07-17 | Discharge: 2014-07-17 | Disposition: A | Discharge status: Home / Self Care | Payer: Medicaid Other | Source: Ambulatory Visit | Attending: Obstetrics & Gynecology | Admitting: Obstetrics & Gynecology

## 2014-07-17 DIAGNOSIS — Z87891 Personal history of nicotine dependence: Secondary | ICD-10-CM | POA: Diagnosis not present

## 2014-07-17 DIAGNOSIS — Z3481 Encounter for supervision of other normal pregnancy, first trimester: Secondary | ICD-10-CM

## 2014-07-17 DIAGNOSIS — Z3201 Encounter for pregnancy test, result positive: Secondary | ICD-10-CM

## 2014-07-17 DIAGNOSIS — O26859 Spotting complicating pregnancy, unspecified trimester: Secondary | ICD-10-CM | POA: Insufficient documentation

## 2014-07-17 LAB — URINE CULTURE
Colony Count: 15000
Special Requests: NORMAL

## 2014-07-17 LAB — GC/CHLAMYDIA PROBE AMP
CT Probe RNA: NEGATIVE
GC Probe RNA: NEGATIVE

## 2014-07-17 LAB — HCG, QUANTITATIVE, PREGNANCY: hCG, Beta Chain, Quant, S: 1690 m[IU]/mL — ABNORMAL HIGH (ref <5)

## 2014-07-17 NOTE — MAU Note (Signed)
Here for follow up. Was seen Thurs at [facility] with pink d/c. Today more like blood but only needs panty liner. Some lower back pain all day and some abd cramping but thinks due to gas.

## 2014-07-17 NOTE — MAU Provider Note (Signed)
History     CSN: [id number]  Arrival date and time: 07/17/14 1838   None     Chief Complaint  Patient presents with  . Follow-up   HPI Katie Briggs 23 y.o. G3P2  presents to MAU for repeat quant levels and U/S following vaginal spotting 2 days ago.  She did also have some spotting today but it has stopped now.  She has lower back pain but no abdominal pain.  No nausea or vomiting.    OB History   Grav Para Term Preterm Abortions TAB SAB Ect Mult Living   3 2              Past Medical History  Diagnosis Date  . Asthma   . Depression   . OCD (obsessive compulsive disorder)   . Bipolar disorder     Past Surgical History  Procedure Laterality Date  . None    . Colonoscopy N/A 11/12/2013    Procedure: COLONOSCOPY;  Surgeon: Corbin Ade, MD;  [facility];  Service: Endoscopy;  Laterality: N/A;  2:40    Family History  Problem Relation Age of Onset  . Colon polyps Maternal Grandmother   . Colon cancer      great grandmother    History  Substance Use Topics  . Smoking status: Former Games developer  . Smokeless tobacco: Not on file  . Alcohol Use: No    Allergies: No Known Allergies  Prescriptions prior to admission  Medication Sig Dispense Refill  . albuterol (PROVENTIL HFA;VENTOLIN HFA) 108 (90 BASE) MCG/ACT inhaler Inhale 2 puffs into the lungs every 6 (six) hours as needed.      . metroNIDAZOLE (FLAGYL) 500 MG tablet Take 1 tablet (500 mg total) by mouth 2 (two) times daily.  14 tablet  0  . Prenatal Vit-Fe Fumarate-FA (PRENATAL COMPLETE) 14-0.4 MG TABS Take 1 tablet by mouth daily.  20 each  0    ROS Pertinent ROS in HPI  Physical Exam   Blood pressure 120/81, pulse 86, temperature 98.3 F (36.8 C), resp. rate 20, height  (1.549 m), weight 76.204 kg (168 lb), last menstrual period 05/31/2014.  Physical Exam  Recent Results (from the past 2160 hour(s))  RAPID STREP SCREEN     Status: None   Collection Time    05/26/14 12:50 AM       Result Value Ref Range   Streptococcus, Group A Screen (Direct) NEGATIVE  NEGATIVE   Comment: (NOTE)     A Rapid Antigen test may result negative if the antigen level in the     sample is below the detection level of this test. The FDA has not     cleared this test as a stand-alone test therefore the rapid antigen     negative result has reflexed to a Group A Strep culture.  CULTURE, GROUP A STREP     Status: None   Collection Time    05/26/14 12:50 AM      Result Value Ref Range   Specimen Description THROAT     Special Requests NONE     Culture       Value: No Beta Hemolytic Streptococci Isolated     Performed at [facility]   Report Status 05/28/2014 FINAL    POC URINE PREG, ED     Status: Abnormal   Collection Time    06/25/14  4:42 PM      Result Value Ref Range   Preg Test,  Ur POSITIVE (*) NEGATIVE   Comment:            THE SENSITIVITY OF THIS     METHODOLOGY IS >24 mIU/mL  HCG, QUANTITATIVE, PREGNANCY     Status: Abnormal   Collection Time    06/25/14  4:51 PM      Result Value Ref Range   hCG, Beta Chain, Quant, S 88 (*) <5 mIU/mL   Comment:              GEST. AGE      CONC.  (mIU/mL)       <=1 WEEK        5 - 50         2 WEEKS       50 - 500         3 WEEKS       100 - 10,000         4 WEEKS     1,000 - 30,000         5 WEEKS     3,500 - 115,000       6-8 WEEKS     12,000 - 270,000        12 WEEKS     15,000 - 220,000                FEMALE AND NON-PREGNANT FEMALE:         LESS THAN 5 mIU/mL  PREGNANCY, URINE     Status: Abnormal   Collection Time    07/15/14  9:10 PM      Result Value Ref Range   Preg Test, Ur POSITIVE (*) NEGATIVE   Comment:            THE SENSITIVITY OF THIS     METHODOLOGY IS >20 mIU/mL.  URINE CULTURE     Status: None   Collection Time    07/15/14  9:10 PM      Result Value Ref Range   Specimen Description URINE, CLEAN CATCH     Special Requests Normal     Culture  Setup Time       Value: 07/16/2014 13:56     Performed  at [facility] Count       Value: 15,000 COLONIES/ML     Performed at [facility]   Culture       Value: DIPHTHEROIDS(CORYNEBACTERIUM SPECIES)     Note: Standardized susceptibility testing for this organism is not available.     Performed at [facility]   Report Status 07/17/2014 FINAL    URINALYSIS, ROUTINE W REFLEX MICROSCOPIC     Status: Abnormal   Collection Time    07/15/14  9:10 PM      Result Value Ref Range   Color, Urine YELLOW  YELLOW   APPearance CLEAR  CLEAR   Specific Gravity, Urine <1.005 (*) 1.005 - 1.030   pH 5.5  5.0 - 8.0   Glucose, UA NEGATIVE  NEGATIVE mg/dL   Hgb urine dipstick TRACE (*) NEGATIVE   Bilirubin Urine NEGATIVE  NEGATIVE   Ketones, ur NEGATIVE  NEGATIVE mg/dL   Protein, ur NEGATIVE  NEGATIVE mg/dL   Urobilinogen, UA 0.2  0.0 - 1.0 mg/dL   Nitrite NEGATIVE  NEGATIVE   Leukocytes, UA SMALL (*) NEGATIVE  URINE MICROSCOPIC-ADD ON     Status: Abnormal   Collection Time    07/15/14  9:10 PM      Result Value  Ref Range   Squamous Epithelial / LPF FEW (*) RARE   WBC, UA 3-6  <3 WBC/hpf   RBC / HPF 0-2  <3 RBC/hpf   Bacteria, UA RARE  RARE  CBC     Status: None   Collection Time    07/15/14  9:58 PM      Result Value Ref Range   WBC 9.3  4.0 - 10.5 K/uL   RBC 4.46  3.87 - 5.11 MIL/uL   Hemoglobin 13.8  12.0 - 15.0 g/dL   HCT 16.1  09.6 - 04.5 %   MCV 89.9  78.0 - 100.0 fL   MCH 30.9  26.0 - 34.0 pg   MCHC 34.4  30.0 - 36.0 g/dL   RDW 40.9  81.1 - 91.4 %   Platelets 258  150 - 400 K/uL  HCG, QUANTITATIVE, PREGNANCY     Status: Abnormal   Collection Time    07/15/14  9:58 PM      Result Value Ref Range   hCG, Beta Chain, Quant, S 1422 (*) <5 mIU/mL   Comment:              GEST. AGE      CONC.  (mIU/mL)       <=1 WEEK        5 - 50         2 WEEKS       50 - 500         3 WEEKS       100 - 10,000         4 WEEKS     1,000 - 30,000         5 WEEKS     3,500 - 115,000       6-8 WEEKS     12,000 - 270,000         12 WEEKS     15,000 - 220,000                FEMALE AND NON-PREGNANT FEMALE:         LESS THAN 5 mIU/mL  ABO/RH     Status: None   Collection Time    07/15/14 10:00 PM      Result Value Ref Range   ABO/RH(D) O POS    WET PREP, GENITAL     Status: Abnormal   Collection Time    07/15/14 10:40 PM      Result Value Ref Range   Yeast Wet Prep HPF POC NONE SEEN  NONE SEEN   Trich, Wet Prep NONE SEEN  NONE SEEN   Clue Cells Wet Prep HPF POC FEW (*) NONE SEEN   WBC, Wet Prep HPF POC FEW (*) NONE SEEN  GC/CHLAMYDIA PROBE AMP     Status: None   Collection Time    07/15/14 10:40 PM      Result Value Ref Range   CT Probe RNA NEGATIVE  NEGATIVE   GC Probe RNA NEGATIVE  NEGATIVE   Comment: (NOTE)                                                                                               **  Normal Reference Range: Negative**          Assay performed using the Gen-Probe APTIMA COMBO2 (R) Assay.     Acceptable specimen types for this assay include APTIMA Swabs (Unisex,     endocervical, urethral, or vaginal), first void urine, and ThinPrep     liquid based cytology samples.     Performed at [facility]  HCG, QUANTITATIVE, PREGNANCY     Status: Abnormal   Collection Time    07/17/14  7:05 PM      Result Value Ref Range   hCG, Beta Chain, Quant, S 1690 (*) <5 mIU/mL   Comment:              GEST. AGE      CONC.  (mIU/mL)       <=1 WEEK        5 - 50         2 WEEKS       50 - 500         3 WEEKS       100 - 10,000         4 WEEKS     1,000 - 30,000         5 WEEKS     3,500 - 115,000       6-8 WEEKS     12,000 - 270,000        12 WEEKS     15,000 - 220,000                FEMALE AND NON-PREGNANT FEMALE:         LESS THAN 5 mIU/mL    MAU Course  Procedures none MDM Discussed with Dr. Macon Large.  Okay to have pt follow up with [place] in 1 week.    Assessment and Plan  A: 6 week IUP  P: Discharge to home Return for U/S in 1 week.  Return to MAU for emergency.    Bertram Denver 07/17/2014, 10:42 PM

## 2014-07-17 NOTE — MAU Note (Signed)
Silvio Clayman PA in Triage to discuss test results and follow up plan with pt.

## 2014-07-17 NOTE — MAU Note (Signed)
Pt in lab

## 2014-07-17 NOTE — MAU Provider Note (Signed)
Attestation of Attending Supervision of Advanced Practitioner (PA/CNM/NP): Evaluation and management procedures were performed by the Advanced Practitioner under my supervision and collaboration.  I have reviewed the Advanced Practitioner's note and chart, and I agree with the management and plan.  Issak Goley, MD, FACOG Attending Obstetrician & Gynecologist Faculty Practice, [facility]   

## 2014-07-21 ENCOUNTER — Inpatient Hospital Stay (HOSPITAL_COMMUNITY): Payer: Medicaid Other

## 2014-07-21 ENCOUNTER — Inpatient Hospital Stay (HOSPITAL_COMMUNITY)
Admission: AD | Admit: 2014-07-21 | Discharge: 2014-07-21 | Disposition: A | Discharge status: Home / Self Care | Payer: Medicaid Other | Source: Ambulatory Visit | Attending: Obstetrics and Gynecology | Admitting: Obstetrics and Gynecology

## 2014-07-21 ENCOUNTER — Encounter (HOSPITAL_COMMUNITY): Payer: Self-pay | Admitting: *Deleted

## 2014-07-21 DIAGNOSIS — O039 Complete or unspecified spontaneous abortion without complication: Secondary | ICD-10-CM

## 2014-07-21 DIAGNOSIS — O209 Hemorrhage in early pregnancy, unspecified: Secondary | ICD-10-CM | POA: Diagnosis present

## 2014-07-21 DIAGNOSIS — Z87891 Personal history of nicotine dependence: Secondary | ICD-10-CM | POA: Insufficient documentation

## 2014-07-21 DIAGNOSIS — O021 Missed abortion: Secondary | ICD-10-CM | POA: Diagnosis not present

## 2014-07-21 MED ORDER — PROMETHAZINE HCL 25 MG PO TABS: 25.0000 mg | ORAL_TABLET | Freq: Once | ORAL | Status: DC

## 2014-07-21 MED ORDER — OXYCODONE-ACETAMINOPHEN 5-325 MG PO TABS: 1.0000 | ORAL_TABLET | Freq: Once | ORAL | Status: DC

## 2014-07-21 MED ORDER — OXYCODONE-ACETAMINOPHEN 5-325 MG PO TABS
1.0000 | ORAL_TABLET | Freq: Once | ORAL | Status: AC
  Administered 2014-07-21: 1 via ORAL
  Filled 2014-07-21 (×2): qty 1

## 2014-07-21 MED ORDER — PROMETHAZINE HCL 25 MG PO TABS
25.0000 mg | ORAL_TABLET | Freq: Once | ORAL | Status: AC
  Administered 2014-07-21: 25 mg via ORAL
  Filled 2014-07-21: qty 1

## 2014-07-21 NOTE — Discharge Instructions (Signed)
Miscarriage °A miscarriage is the loss of an unborn baby (fetus) before the 20th week of pregnancy. The cause is often unknown.  °HOME CARE °· You may need to stay in bed (bed rest), or you may be able to do light activity. Go about activity as told by your doctor. °· Have help at home. °· Write down how many pads you use each day. Write down how soaked they are. °· Do not use tampons. Do not wash out your vagina (douche) or have sex (intercourse) until your doctor approves. °· Only take medicine as told by your doctor. °· Do not take aspirin. °· Keep all doctor visits as told. °· If you or your partner have problems with grieving, talk to your doctor. You can also try counseling. Give yourself time to grieve before trying to get pregnant again. °GET HELP RIGHT AWAY IF: °· You have bad cramps or pain in your back or belly (abdomen). °· You have a fever. °· You pass large clumps of blood (clots) from your vagina that are walnut-sized or larger. Save the clumps for your doctor to see. °· You pass large amounts of tissue from your vagina. Save the tissue for your doctor to see. °· You have more bleeding. °· You have thick, bad-smelling fluid (discharge) coming from the vagina. °· You get lightheaded, weak, or you pass out (faint). °· You have chills. °MAKE SURE YOU: °· Understand these instructions. °· Will watch your condition. °· Will get help right away if you are not doing well or get worse. °Document Released: 01/14/2012 Document Reviewed: 01/14/2012 °ExitCare® Patient Information ©2015 [employer]. This information is not intended to replace advice given to you by your health care provider. Make sure you discuss any questions you have with your health care provider. °Pelvic Rest °Pelvic rest is sometimes recommended for women when:  °· The placenta is partially or completely covering the opening of the cervix (placenta previa). °· There is bleeding between the uterine wall and the amniotic sac in the first  trimester (subchorionic hemorrhage). °· The cervix begins to open without labor starting (incompetent cervix, cervical insufficiency). °· The labor is too early (preterm labor). °HOME CARE INSTRUCTIONS °· Do not have sexual intercourse, stimulation, or an orgasm. °· Do not use tampons, douche, or put anything in the vagina. °· Do not lift anything over 10 pounds (4.5 kg). °· Avoid strenuous activity or straining your pelvic muscles. °SEEK MEDICAL CARE IF:  °· You have any vaginal bleeding during pregnancy. Treat this as a potential emergency. °· You have cramping pain felt low in the stomach (stronger than menstrual cramps). °· You notice vaginal discharge (watery, mucus, or bloody). °· You have a low, dull backache. °· There are regular contractions or uterine tightening. °SEEK IMMEDIATE MEDICAL CARE IF: °You have vaginal bleeding and have placenta previa.  °Document Released: 02/16/2011 Document Revised: 01/14/2012 Document Reviewed: 02/16/2011 °ExitCare® Patient Information ©2015 [employer]. This information is not intended to replace advice given to you by your health care provider. Make sure you discuss any questions you have with your health care provider. ° °

## 2014-07-21 NOTE — MAU Note (Signed)
Has been having Vag bleeding off and on. Became heavier today  And started passing clots.todays, largest is pea sized.

## 2014-07-21 NOTE — MAU Provider Note (Signed)
First Provider Initiated Contact with Patient 07/21/14 1810      Chief Complaint:  Vaginal Bleeding   Katie Briggs is  23 y.o. G3P2 at [redacted]w[redacted]d presents complaining of Vaginal Bleeding She noted bright red streaking the toilet paper. Wears 1 sanitary pad a day and at the end of the day the blood spot is the size of "a roma tomato."  Passed 3 blood clots today: one the size of a thumbnail, the other two were half the size. The clots were maroon in color. Patient endorsing "menstrual-like cramps" in the lower abdomen.   Patient has a h/o prolapsed cervix   Of note, the pt initially went to [facility] on 9/[facility]: at that time a IUP was noted on U/S. She f/u at the MAU for a repeat quantitative hCG on 9/12 with inappropriate rise: 1422>1690. She was told at that time to f/u in 1 week for a repeat U/S but was worried about the clots therefore returned today.   Obstetrical/Gynecological History: OB History   Grav Para Term Preterm Abortions TAB SAB Ect Mult Living   Past Medical History: Past Medical History  Diagnosis Date  . Asthma   . Depression   . OCD (obsessive compulsive disorder)   . Bipolar disorder     Past Surgical History: Past Surgical History  Procedure Laterality Date  . None    . Colonoscopy N/A 11/12/2013    Procedure: COLONOSCOPY;  Surgeon: Corbin Ade, MD;  [facility];  Service: Endoscopy;  Laterality: N/A;  2:40    Family History: Family History  Problem Relation Age of Onset  . Colon polyps Maternal Grandmother   . Colon cancer      great grandmother    Social History: History  Substance Use Topics  . Smoking status: Former Games developer  . Smokeless tobacco: Not on file  . Alcohol Use: No    Allergies: No Known Allergies  Meds:  Prescriptions prior to admission  Medication Sig Dispense Refill  . albuterol (PROVENTIL HFA;VENTOLIN HFA) 108 (90 BASE) MCG/ACT inhaler Inhale 2 puffs into the lungs every 6 (six) hours as needed.       . metroNIDAZOLE (FLAGYL) 500 MG tablet Take 1 tablet (500 mg total) by mouth 2 (two) times daily.  14 tablet  0  . Prenatal Vit-Fe Fumarate-FA (PRENATAL COMPLETE) 14-0.4 MG TABS Take 1 tablet by mouth daily.  20 each  0    Review of Systems -   Review of Systems  Constitutional: Negative for fever, chills, weight loss, malaise/fatigue and diaphoresis.  HENT: Negative for hearing loss, ear pain, nosebleeds, congestion, sore throat, neck pain, tinnitus and ear discharge.   Eyes: Negative for blurred vision, double vision, photophobia, pain, discharge and redness.  Respiratory: Negative for cough, hemoptysis, sputum production, shortness of breath, wheezing and stridor.   Cardiovascular: Negative for chest pain, palpitations, orthopnea,  leg swelling  Gastrointestinal: Negative for abdominal pain heartburn, nausea, vomiting, diarrhea, constipation, blood in stool Genitourinary: Negative for dysuria, urgency, frequency, hematuria and flank pain.  Musculoskeletal: Negative for myalgias, back pain, joint pain and falls.  Skin: Negative for itching and rash.  Neurological: Negative for dizziness, tingling, tremors, sensory change, speech change, focal weakness, seizures, loss of consciousness, weakness and headaches.  Endo/Heme/Allergies: Negative for environmental allergies and polydipsia. Does not bruise/bleed easily.  Psychiatric/Behavioral: Negative for depression, suicidal ideas, hallucinations, memory loss and substance abuse. The patient  is not nervous/anxious and does not have insomnia.     Physical Exam  Blood pressure 120/62, pulse 92, temperature 98.6 F (37 C), temperature source Oral, resp. rate 18, height 5' (1.524 m), weight 74.844 kg (165 lb), last menstrual period 05/31/2014. GENERAL: Well-developed, well-nourished female in no acute distress.  LUNGS: Clear to auscultation bilaterally.  HEART: Regular rate and rhythm. ABDOMEN: Soft, nontender, nondistended, gravid.   EXTREMITIES: Nontender, no edema, 2+ distal pulses. DTR's 2+ SPECULUM EXAM: Small amount of bright red blood in the vaginal vault and in the cervical os. No products of conception noted.  Bedside ultrasound: unable to note a heartbeat but this could be secondary to gestational age.   Labs: No results found for this or any previous visit (from the past 24 hour(s)). Imaging Studies:  US Ob Comp Less 14 Wks  07/17/2014   CLINICAL DATA:  Vaginal bleeding.  EXAM: OBSTETRIC <14 WK [place] AND TRANSVAGINAL OB US  TECHNIQUE: Both transabdominal and transvaginal ultrasound examinations were performed for complete evaluation of the gestation as well as the maternal uterus, adnexal regions, and pelvic cul-de-sac. Transvaginal technique was performed to assess early pregnancy.  COMPARISON:  None.  FINDINGS: Intrauterine gestational sac: Visualized/normal in shape.  Yolk sac:  Present  Embryo:  Present  Cardiac Activity: Present  Heart Rate:  139 bpm  MSD:  8.2  mm   5 w   3  d  CRL:   8.1  mm   6 w 6 d                  [place] EDC: 03/11/2015  Maternal uterus/adnexae:  No subchorionic hemorrhage.  Normal right ovary.  Normal left ovary.  Trace free pelvic fluid.  Retroverted uterus.  IMPRESSION: Single living intrauterine embryo estimated at 6 weeks and 6 days gestation.  Somewhat disproportionate size of the embryonic pole in comparison to the gestational sac.   Electronically Signed   By: Loralie Champagne M.D.   On: 07/17/2014 22:24   No results found for this or any previous visit (from the past 24 hour(s)). US Ob Transvaginal  07/21/2014   CLINICAL DATA:  Passing blood clots.  EXAM: TRANSVAGINAL OB ULTRASOUND  TECHNIQUE: Transvaginal ultrasound was performed for complete evaluation of the gestation as well as the maternal uterus, adnexal regions, and pelvic cul-de-sac.  COMPARISON:  Pelvic ultrasound 07/17/2014.  FINDINGS: Intrauterine gestational sac: Visualized/normal in shape.  Yolk sac:  Present  Embryo:  Present   Cardiac Activity: Not present  CRL:   9.6  mm   7 w 0 d                  [place] EDC: 03/19/2015  Maternal uterus/adnexae: Normal bilateral ovaries. No subchorionic hematoma. No free fluid in the pelvis.  IMPRESSION: No cardiac activity identified on today's examination. This was present on prior examination. Given crown-rump length and prior evaluation with cardiac activity, findings meet definitive criteria for failed pregnancy. This follows SRU consensus guidelines: Diagnostic Criteria for Nonviable Pregnancy Early in the First Trimester. [facility]acy Mis J Med [phone].   Electronically Signed   By: Annia Belt M.D.   On: 07/21/2014 20:12    Assessment: Katie Briggs is  23 y.o. G3P2 at [redacted]w[redacted]d presents with vaginal bleeding and inappropriate quantitative beta hCG increase.  A heart beat was noted on U/S on 9/12. No products of conception noted on speculum exam  A: Failed Pregnancy  Plan: -- OB ultrasound pending / results were  discussed with pt -- Will transfer care over to MAU provider Options of D&C, Cytotec, and natural were discussed with patient and husband. Her choice was to return to her OB at [facility]   P: Pt prefers to schedule her D&C with Dr Earlie Server at [facility] Percocet/ Phenergan for here and home Pelvic Rest   Joanna Puff 9/16/20156:15 PM  No cardiac activity on my bedside US> [place] confirms embyonic demise Evaluation and management procedures were performed by Resident physician under my supervision/collaboration. Chart reviewed, patient examined by me and I agree with management and plan. Danae Orleans, CNM 07/23/2014 12:00 PM

## 2014-07-21 NOTE — Progress Notes (Signed)
Patient has confirmed IUP.

## 2014-07-26 NOTE — MAU Provider Note (Signed)
Attestation of Attending Supervision of Advanced Practitioner (CNM/NP): Evaluation and management procedures were performed by the Advanced Practitioner under my supervision and collaboration. I have reviewed the Advanced Practitioner's note and chart, and I agree with the management and plan.  Calton Harshfield H. 10:16 AM

## 2014-08-23 ENCOUNTER — Encounter (HOSPITAL_COMMUNITY): Payer: Self-pay | Admitting: Emergency Medicine

## 2014-08-23 ENCOUNTER — Emergency Department (HOSPITAL_COMMUNITY)
Admission: EM | Admit: 2014-08-23 | Discharge: 2014-08-23 | Disposition: A | Discharge status: Home / Self Care | Payer: Medicaid Other | Attending: Emergency Medicine | Admitting: Emergency Medicine

## 2014-08-23 DIAGNOSIS — Z87891 Personal history of nicotine dependence: Secondary | ICD-10-CM | POA: Insufficient documentation

## 2014-08-23 DIAGNOSIS — J45909 Unspecified asthma, uncomplicated: Secondary | ICD-10-CM | POA: Diagnosis not present

## 2014-08-23 DIAGNOSIS — Z8659 Personal history of other mental and behavioral disorders: Secondary | ICD-10-CM | POA: Insufficient documentation

## 2014-08-23 DIAGNOSIS — M79601 Pain in right arm: Secondary | ICD-10-CM | POA: Diagnosis present

## 2014-08-23 DIAGNOSIS — M7981 Nontraumatic hematoma of soft tissue: Secondary | ICD-10-CM | POA: Insufficient documentation

## 2014-08-23 DIAGNOSIS — S40021A Contusion of right upper arm, initial encounter: Secondary | ICD-10-CM

## 2014-08-23 DIAGNOSIS — Z79899 Other long term (current) drug therapy: Secondary | ICD-10-CM | POA: Diagnosis not present

## 2014-08-23 LAB — CBC WITH DIFFERENTIAL/PLATELET
Basophils Absolute: 0.1 10*3/uL (ref 0.0–0.1)
Basophils Relative: 1 % (ref 0–1)
Eosinophils Absolute: 0.4 10*3/uL (ref 0.0–0.7)
Eosinophils Relative: 4 % (ref 0–5)
HCT: 39.6 % (ref 36.0–46.0)
Hemoglobin: 13.8 g/dL (ref 12.0–15.0)
Lymphocytes Relative: 39 % (ref 12–46)
Lymphs Abs: 3.7 10*3/uL (ref 0.7–4.0)
MCH: 30.9 pg (ref 26.0–34.0)
MCHC: 34.8 g/dL (ref 30.0–36.0)
MCV: 88.8 fL (ref 78.0–100.0)
Monocytes Absolute: 0.7 10*3/uL (ref 0.1–1.0)
Monocytes Relative: 7 % (ref 3–12)
Neutro Abs: 4.8 10*3/uL (ref 1.7–7.7)
Neutrophils Relative %: 49 % (ref 43–77)
Platelets: 275 10*3/uL (ref 150–400)
RBC: 4.46 MIL/uL (ref 3.87–5.11)
RDW: 12.8 % (ref 11.5–15.5)
WBC: 9.7 10*3/uL (ref 4.0–10.5)

## 2014-08-23 LAB — PROTIME-INR
INR: 1.06 (ref 0.00–1.49)
Prothrombin Time: 13.9 seconds (ref 11.6–15.2)

## 2014-08-23 NOTE — ED Notes (Signed)
Pt reports she has been bruising on her right arm x 2 weeks. Denies any injury.

## 2014-08-23 NOTE — ED Provider Notes (Signed)
CSN: [id number]     Arrival date & time 08/23/14  2059 History   First MD Initiated Contact with Patient 08/23/14 2201     Chief Complaint  Patient presents with  . Arm Pain     (Consider location/radiation/quality/duration/timing/severity/associated sxs/prior Treatment) Patient is a 23 y.o. female presenting with arm pain. The history is provided by the patient.  Arm Pain This is a new problem. The current episode started 1 to 4 weeks ago. The problem occurs constantly. The problem has been unchanged.   Addeline R Ladona Ridgelaylor is a 23 y.o. female who presents to the ED with bruising to the right arm in 3 places that she has noted over the past 2 weeks. She denies any injury or any domestic violence. She is concerned that something may be wrong that is causing the bruising. She reports having a D&C last month after having a miscarriage but no other recent medical problems. She denies fever or chills, n/v,or other problems.   Past Medical History  Diagnosis Date  . Asthma   . Depression   . OCD (obsessive compulsive disorder)   . Bipolar disorder    Past Surgical History  Procedure Laterality Date  . None    . Colonoscopy N/A 11/12/2013    Procedure: COLONOSCOPY;  Surgeon: Corbin Adeobert M Rourk, MD;  Location: [facility];  Service: Endoscopy;  Laterality: N/A;  2:40  . Dilation and curettage of uterus     Family History  Problem Relation Age of Onset  . Colon polyps Maternal Grandmother   . Colon cancer      great grandmother   History  Substance Use Topics  . Smoking status: Former Games developermoker  . Smokeless tobacco: Not on file  . Alcohol Use: No   OB History   Grav Para Term Preterm Abortions TAB SAB Ect Mult Living   3 2        2      Review of Systems Negative except as stated in HPI   Allergies  Review of patient's allergies indicates no known allergies.  Home Medications   Prior to Admission medications   Medication Sig Start Date End Date Taking? Authorizing Provider    albuterol (PROVENTIL HFA;VENTOLIN HFA) 108 (90 BASE) MCG/ACT inhaler Inhale 2 puffs into the lungs every 6 (six) hours as needed.    Historical Provider, MD   BP 107/67  Pulse 85  Temp(Src) 99 F (37.2 C) (Oral)  Resp 20  Ht 5\' 1"  (1.549 m)  Wt 163 lb (73.936 kg)  BMI 30.81 kg/m2  SpO2 100%  LMP 05/31/2014 Physical Exam  Nursing note and vitals reviewed. Constitutional: She is oriented to person, place, and time. She appears well-developed and well-nourished. No distress.  HENT:  Head: Normocephalic.  Eyes: EOM are normal.  Neck: Neck supple.  Cardiovascular: Normal rate.   Pulmonary/Chest: Effort normal and breath sounds normal.  Musculoskeletal: Normal range of motion.       Arms: There are 3 small areas of ecchymosis noted to the right upper arm and are tender on palpation. Radial pulse strong, adequate circulation, good touch sensation.   Neurological: She is alert and oriented to person, place, and time. No cranial nerve deficit.  Skin: Skin is warm and dry.  Psychiatric: She has a normal mood and affect. Her behavior is normal.    ED Course  Procedures (including critical care time) Labs Review Results for orders placed during the hospital encounter of 08/23/14 (from the past 24 hour(s))  CBC  WITH DIFFERENTIAL     Status: None   Collection Time    08/23/14 10:21 PM      Result Value Ref Range   WBC 9.7  4.0 - 10.5 K/uL   RBC 4.46  3.87 - 5.11 MIL/uL   Hemoglobin 13.8  12.0 - 15.0 g/dL   HCT 16.139.6  09.636.0 - 04.546.0 %   MCV 88.8  78.0 - 100.0 fL   MCH 30.9  26.0 - 34.0 pg   MCHC 34.8  30.0 - 36.0 g/dL   RDW 40.912.8  81.111.5 - 91.415.5 %   Platelets 275  150 - 400 K/uL   Neutrophils Relative % 49  43 - 77 %   Neutro Abs 4.8  1.7 - 7.7 K/uL   Lymphocytes Relative 39  12 - 46 %   Lymphs Abs 3.7  0.7 - 4.0 K/uL   Monocytes Relative 7  3 - 12 %   Monocytes Absolute 0.7  0.1 - 1.0 K/uL   Eosinophils Relative 4  0 - 5 %   Eosinophils Absolute 0.4  0.0 - 0.7 K/uL   Basophils  Relative 1  0 - 1 %   Basophils Absolute 0.1  0.0 - 0.1 K/uL  PROTIME-INR     Status: None   Collection Time    08/23/14 10:21 PM      Result Value Ref Range   Prothrombin Time 13.9  11.6 - 15.2 seconds   INR 1.06  0.00 - 1.49    MDM  Dr. Judd Lienelo in to examine the patient and discussed in detail lab findings and plan of care.  23 y.o. female with ecchymosis of the right upper arm without known injury. Discussed with the patient need for PCP and follow up. She will return for problems.       [facility] Orlene OchM Aphrodite Harpenau, [place] 08/24/14 [phone]

## 2014-08-23 NOTE — Discharge Instructions (Signed)
We have checked you blood tonight and it shows that you are not anemic. We checked your platelets and they are normal. We checked your clotting time and it was normal. We are not sure of the reason for your bruising however, all our tests tonight are normal. It is important that you follow up with a primary care doctor to do any further evaluation of your symptoms. If the bruising continues and increases return.

## 2014-08-26 NOTE — ED Provider Notes (Signed)
Medical screening examination/treatment/procedure(s) were conducted as a shared visit with non-physician practitioner(s) and myself.  I personally evaluated the patient during the encounter. Patient is a 23 year old female with history of bipolar, obsessive-compulsive disorder, depression. She presents with complaints of 3 small bruises to her right upper arm in the absence of any injury or trauma. She is concerned about why she is getting these. She denies any other symptoms. She denies any bleeding from her gums or heavy vaginal bleeding.  On exam, vitals are stable and she is afebrile. Head is atraumatic, normocephalic. Neck is supple. Heart is regular rate and rhythm and lungs are clear. The right arm is noted to have 3 small, dime sized contusions to her right upper arm. There is no redness or erythema.  Patient has 3 small bruises that are likely related to trauma. Her platelet count is normal and coagulation studies are normal as well. I explained to her that there is nothing more to do this evening for this condition other than to give it time. She is to return if she worsens and followup with her primary Dr. She seems somewhat annoyed that I have not given her a better explanation, however I feel I have ruled out an emergent condition.     Geoffery Lyonsouglas Reshad Saab, MD 08/26/14 [phone]

## 2014-09-06 ENCOUNTER — Encounter (HOSPITAL_COMMUNITY): Payer: Self-pay | Admitting: Emergency Medicine

## 2014-11-05 ENCOUNTER — Encounter (HOSPITAL_COMMUNITY): Payer: Self-pay

## 2014-11-05 ENCOUNTER — Inpatient Hospital Stay (HOSPITAL_COMMUNITY)
Admission: AD | Admit: 2014-11-05 | Discharge: 2014-11-05 | Disposition: A | Discharge status: Home / Self Care | Payer: Medicaid Other | Source: Ambulatory Visit | Attending: Obstetrics and Gynecology | Admitting: Obstetrics and Gynecology

## 2014-11-05 DIAGNOSIS — Z87891 Personal history of nicotine dependence: Secondary | ICD-10-CM | POA: Insufficient documentation

## 2014-11-05 DIAGNOSIS — N946 Dysmenorrhea, unspecified: Secondary | ICD-10-CM

## 2014-11-05 DIAGNOSIS — N949 Unspecified condition associated with female genital organs and menstrual cycle: Secondary | ICD-10-CM | POA: Insufficient documentation

## 2014-11-05 DIAGNOSIS — N939 Abnormal uterine and vaginal bleeding, unspecified: Secondary | ICD-10-CM | POA: Insufficient documentation

## 2014-11-05 HISTORY — DX: Anxiety disorder, unspecified: F41.9

## 2014-11-05 LAB — URINE MICROSCOPIC-ADD ON

## 2014-11-05 LAB — URINALYSIS, ROUTINE W REFLEX MICROSCOPIC
Bilirubin Urine: NEGATIVE
Glucose, UA: NEGATIVE mg/dL
Ketones, ur: NEGATIVE mg/dL
Leukocytes, UA: NEGATIVE
Nitrite: NEGATIVE
Protein, ur: NEGATIVE mg/dL
Specific Gravity, Urine: <1.005 — ABNORMAL LOW (ref 1.005–1.030)
Urobilinogen, UA: 0.2 mg/dL (ref 0.0–1.0)
pH: 6 (ref 5.0–8.0)

## 2014-11-05 LAB — POCT PREGNANCY, URINE: Preg Test, Ur: NEGATIVE

## 2014-11-05 MED ORDER — IBUPROFEN 600 MG PO TABS: 600.0000 mg | ORAL_TABLET | Freq: Four times a day (QID) | ORAL | Status: DC | PRN

## 2014-11-05 MED ORDER — KETOROLAC TROMETHAMINE 60 MG/2ML IM SOLN
60.0000 mg | Freq: Once | INTRAMUSCULAR | Status: AC
  Administered 2014-11-05: 60 mg via INTRAMUSCULAR
  Filled 2014-11-05: qty 2

## 2014-11-05 NOTE — MAU Provider Note (Signed)
History     CSN: [id number]  Arrival date and time: 11/05/14 4098   First Provider Initiated Contact with Patient 11/05/14 2021      Chief Complaint  Patient presents with  . Vaginal Bleeding   HPI This is a 24 y.o. female who presents with c/o cramping which started today. Periods are normally irregular, this one is a little early . Thought she might be having a miscarriage, but has not taken a pregnancy test at home. Has not missed any cycles.   RN Note: Pt states she felt fine until around 1:30pm then she started having vaginal bleeding she thought was the start of her period. Pt states having painful cramping and passing clots. She now states that her bleeding is very scant and she is afraid she may have had a miscarriage, but she is not sure if she is pregnant.          OB History    Gravida Para Term Preterm AB TAB SAB Ectopic Multiple Living   Past Medical History  Diagnosis Date  . Asthma   . Depression   . OCD (obsessive compulsive disorder)   . Bipolar disorder   . Anxiety     Past Surgical History  Procedure Laterality Date  . None    . Colonoscopy N/A 11/12/2013    Procedure: COLONOSCOPY;  Surgeon: Corbin Ade, MD;  Location: [facility];  Service: Endoscopy;  Laterality: N/A;  2:40  . Dilation and curettage of uterus      Family History  Problem Relation Age of Onset  . Colon polyps Maternal Grandmother   . Colon cancer      great grandmother    History  Substance Use Topics  . Smoking status: Former Games developer  . Smokeless tobacco: Not on file  . Alcohol Use: No    Allergies: No Known Allergies  Prescriptions prior to admission  Medication Sig Dispense Refill Last Dose  . acetaminophen (TYLENOL) 325 MG tablet Take 325 mg by mouth every 6 (six) hours as needed for mild pain.   Past Week at Unknown time  . albuterol (PROVENTIL HFA;VENTOLIN HFA) 108 (90 BASE) MCG/ACT inhaler Inhale 2 puffs into the lungs every 6 (six)  hours as needed.   rescue    Review of Systems  Constitutional: Negative for fever, chills and malaise/fatigue.  Gastrointestinal: Positive for abdominal pain. Negative for nausea, vomiting, diarrhea and constipation.  Genitourinary:       Spotting   Neurological: Negative for headaches.   Physical Exam   Blood pressure 111/76, pulse 69, temperature 98.7 F (37.1 C), temperature source Oral, resp. rate 16, height 5' 2.6" (1.59 m), weight 168 lb (76.204 kg), last menstrual period 10/13/2014, unknown if currently breastfeeding.  Physical Exam  Constitutional: She is oriented to person, place, and time. She appears well-developed and well-nourished. No distress.  HENT:  Head: Normocephalic.  Cardiovascular: Normal rate.   Respiratory: Effort normal.  GI: Soft. She exhibits no distension. There is tenderness (slight suprapubic pain). There is no rebound and no guarding.  Genitourinary: No vaginal discharge found.  Declines pelvic exam   Musculoskeletal: Normal range of motion.  Neurological: She is alert and oriented to person, place, and time.  Skin: Skin is warm and dry.  Psychiatric: She has a normal mood and affect.    MAU Course  Procedures  MDM Results for Katie Briggs, Katie Briggs (MRN [id number])  as of 11/08/2014 13:58  Ref. Range 11/05/2014 19:51  Preg Test, Ur Latest Range: NEGATIVE  NEGATIVE  Results for Katie Briggs, Katie Briggs (MRN [id number]) as of 11/08/2014 13:58  Ref. Range 11/05/2014 19:46  Color, Urine Latest Range: YELLOW  YELLOW  APPearance Latest Range: CLEAR  CLEAR  Specific Gravity, Urine Latest Range: 1.005-1.030  <1.005 (L)  pH Latest Range: 5.0-8.0  6.0  Glucose Latest Range: NEGATIVE mg/dL NEGATIVE  Bilirubin Urine Latest Range: NEGATIVE  NEGATIVE  Ketones, ur Latest Range: NEGATIVE mg/dL NEGATIVE  Protein Latest Range: NEGATIVE mg/dL NEGATIVE  Urobilinogen, UA Latest Range: 0.0-1.0 mg/dL 0.2  Nitrite Latest Range: NEGATIVE  NEGATIVE  Leukocytes, UA Latest Range:  NEGATIVE  NEGATIVE  Hgb urine dipstick Latest Range: NEGATIVE  TRACE (A)  WBC, UA Latest Range: <3 WBC/hpf 0-2  RBC / HPF Latest Range: <3 RBC/hpf 0-2  Squamous Epithelial / LPF Latest Range: RARE  RARE  Bacteria, UA Latest Range: RARE  RARE    Assessment and Plan  A:  Not pregnant      Pelvic cramping and spotting      Probable dysmenorrhea  P:  Discharge home       Discussed results      .   Medication List    TAKE these medications        acetaminophen 325 MG tablet  Commonly known as:  TYLENOL  Take 325 mg by mouth every 6 (six) hours as needed for mild pain.     albuterol 108 (90 BASE) MCG/ACT inhaler  Commonly known as:  PROVENTIL HFA;VENTOLIN HFA  Inhale 2 puffs into the lungs every 6 (six) hours as needed.     ibuprofen 600 MG tablet  Commonly known as:  ADVIL,MOTRIN  Take 1 tablet (600 mg total) by mouth every 6 (six) hours as needed.         [facility] 11/05/2014, 8:33 PM

## 2014-11-05 NOTE — Discharge Instructions (Signed)

## 2014-11-05 NOTE — MAU Note (Signed)
Pt states she felt fine until around 1:30pm then she started having vaginal bleeding she thought was the start of her period.  Pt states having painful cramping and passing clots.  She now states that her bleeding is very scant and she is afraid she may have had a miscarriage, but she is not sure if she is pregnant.

## 2015-03-23 ENCOUNTER — Emergency Department (HOSPITAL_COMMUNITY)
Admission: EM | Admit: 2015-03-23 | Discharge: 2015-03-23 | Disposition: A | Discharge status: Home / Self Care | Payer: Medicaid Other | Attending: Emergency Medicine | Admitting: Emergency Medicine

## 2015-03-23 ENCOUNTER — Encounter (HOSPITAL_COMMUNITY): Payer: Self-pay | Admitting: Emergency Medicine

## 2015-03-23 DIAGNOSIS — J45909 Unspecified asthma, uncomplicated: Secondary | ICD-10-CM | POA: Insufficient documentation

## 2015-03-23 DIAGNOSIS — Z79899 Other long term (current) drug therapy: Secondary | ICD-10-CM | POA: Insufficient documentation

## 2015-03-23 DIAGNOSIS — Z87891 Personal history of nicotine dependence: Secondary | ICD-10-CM | POA: Insufficient documentation

## 2015-03-23 DIAGNOSIS — O99511 Diseases of the respiratory system complicating pregnancy, first trimester: Secondary | ICD-10-CM | POA: Insufficient documentation

## 2015-03-23 DIAGNOSIS — K5900 Constipation, unspecified: Secondary | ICD-10-CM

## 2015-03-23 DIAGNOSIS — Z8659 Personal history of other mental and behavioral disorders: Secondary | ICD-10-CM | POA: Diagnosis not present

## 2015-03-23 DIAGNOSIS — O99611 Diseases of the digestive system complicating pregnancy, first trimester: Secondary | ICD-10-CM | POA: Insufficient documentation

## 2015-03-23 DIAGNOSIS — Z3A01 Less than 8 weeks gestation of pregnancy: Secondary | ICD-10-CM | POA: Diagnosis not present

## 2015-03-23 DIAGNOSIS — Z3490 Encounter for supervision of normal pregnancy, unspecified, unspecified trimester: Secondary | ICD-10-CM

## 2015-03-23 LAB — URINALYSIS, ROUTINE W REFLEX MICROSCOPIC
Bilirubin Urine: NEGATIVE
Glucose, UA: NEGATIVE mg/dL
Hgb urine dipstick: NEGATIVE
Ketones, ur: NEGATIVE mg/dL
Leukocytes, UA: NEGATIVE
Nitrite: NEGATIVE
Protein, ur: NEGATIVE mg/dL
Specific Gravity, Urine: 1.025 (ref 1.005–1.030)
Urobilinogen, UA: 1 mg/dL (ref 0.0–1.0)
pH: 6 (ref 5.0–8.0)

## 2015-03-23 LAB — PREGNANCY, URINE: Preg Test, Ur: POSITIVE — AB

## 2015-03-23 MED ORDER — DOCUSATE SODIUM 100 MG PO CAPS: 100.0000 mg | ORAL_CAPSULE | Freq: Two times a day (BID) | ORAL | Status: DC | PRN

## 2015-03-23 NOTE — ED Provider Notes (Signed)
CSN: [id number]     Arrival date & time 03/23/15  1015 History  This chart was scribed for Katie Briggs Katie Aloisi, MD by Tanda RockersMargaux Venter, ED Scribe. This patient was seen in room [facility] and the patient's care was started at 10:36 AM.    Chief Complaint  Patient presents with  . Constipation   The history is provided by the patient. No language interpreter was used.     HPI Comments: Katie Briggs is a 24 y.o. female who presents to the Emergency Department complaining of constipation x 3 weeks. Pt has tried suppositories and laxatives without relief. Pt also complains of abdominal bloating and nausea but thinks the nausea could be attributed to being pregnant. LNMP: 02/07/2015. Pt reports sudden onset dysuria and mild bilateral flank pain that began today. She has hx UTIs in the past. Last UTI was approximately 1 year ago. Denies fever, chills, vaginal bleeding, vaginal discharge, or any other symptoms.  Past Medical History  Diagnosis Date  . Asthma   . Depression   . OCD (obsessive compulsive disorder)   . Bipolar disorder   . Anxiety    Past Surgical History  Procedure Laterality Date  . None    . Colonoscopy N/A 11/12/2013    Procedure: COLONOSCOPY;  Surgeon: Corbin Adeobert M Rourk, MD;  [facility]: [facility];  Service: Endoscopy;  Laterality: N/A;  2:40  . Dilation and curettage of uterus     Family History  Problem Relation Age of Onset  . Colon polyps Maternal Grandmother   . Colon cancer      great grandmother   History  Substance Use Topics  . Smoking status: Former Games developermoker  . Smokeless tobacco: Not on file  . Alcohol Use: No   OB History    Gravida Para Term Preterm AB TAB SAB Ectopic Multiple Living   4 2        2      Review of Systems  Constitutional: Negative for fever and chills.  HENT: Negative for congestion and rhinorrhea.   Respiratory: Negative for cough and shortness of breath.   Cardiovascular: Negative for chest pain.  Gastrointestinal: Positive for  constipation and abdominal distention. Negative for nausea, vomiting, abdominal pain and diarrhea.  Genitourinary: Positive for dysuria and flank pain. Negative for vaginal bleeding and vaginal discharge.  Musculoskeletal: Negative for back pain.  Neurological: Negative for headaches.      Allergies  Review of patient's allergies indicates no known allergies.  Home Medications   Prior to Admission medications   Medication Sig Start Date End Date Taking? Authorizing Provider  albuterol (PROVENTIL HFA;VENTOLIN HFA) 108 (90 BASE) MCG/ACT inhaler Inhale 2 puffs into the lungs every 6 (six) hours as needed.   Yes Historical Provider, MD  docusate sodium (COLACE) 100 MG capsule Take 1 capsule (100 mg total) by mouth 2 (two) times daily as needed for mild constipation or moderate constipation. 03/23/15   Katie Briggs Clairissa Valvano, MD  ibuprofen (ADVIL,MOTRIN) 600 MG tablet Take 1 tablet (600 mg total) by mouth every 6 (six) hours as needed. Patient not taking: Reported on 03/23/2015 11/05/14   Aviva SignsMarie L Williams, CNM   Triage Vitals: BP 113/66 mmHg  Pulse 86  Temp(Src) 98.3 F (36.8 C)  Resp 18  Ht 5\' 1"  (1.549 m)  Wt 169 lb (76.658 kg)  BMI 31.95 kg/m2  SpO2 99%  LMP 02/07/2015   Physical Exam  Constitutional: She is oriented to person, place, and time. She appears well-developed and well-nourished. No distress.  HENT:  Head: Normocephalic and atraumatic.  Eyes: Conjunctivae and EOM are normal.  Neck: Neck supple. No tracheal deviation present.  Cardiovascular: Normal rate.   Pulmonary/Chest: Effort normal. No respiratory distress.  Abdominal: Soft. There is no tenderness.  No abdominal extension.  No hernias.   Musculoskeletal: Normal range of motion.  Neurological: She is alert and oriented to person, place, and time.  Skin: Skin is warm and dry.  Psychiatric: She has a normal mood and affect. Her behavior is normal.  Nursing note and vitals reviewed.   ED Course  Procedures (including  critical care time)  DIAGNOSTIC STUDIES: Oxygen Saturation is 99% on RA, normal by my interpretation.    COORDINATION OF CARE: 10:38 AM-Discussed treatment plan which includes UA and urine pregnancy test with pt at bedside and pt agreed to plan.   Labs Review Labs Reviewed  PREGNANCY, URINE - Abnormal; Notable for the following:    Preg Test, Ur POSITIVE (*)    All other components within normal limits  URINALYSIS, ROUTINE W REFLEX MICROSCOPIC    Imaging Review No results found.   EKG Interpretation None      MDM   Final diagnoses:  Pregnant  Constipation, unspecified constipation type   Patient with likely constipation. Also pregnant last menses was around 6 weeks ago. Had some dysuria but no vaginal discharge. Urinalysis does not show infection. Patient will follow-up with health department as planned. Will start on Colace.  I personally performed the services described in this documentation, which was scribed in my presence. The recorded information has been reviewed and is accurate.       Katie Briggs Deniese Oberry, MD 03/23/15 [phone]

## 2015-03-23 NOTE — ED Notes (Signed)
Pt c/o constipation x 3 weeks with no relief from otc medication. Pt also c/o uti sx today. Some nausea, denies vomiting. Pt states she had a positive hpt x 2 weeks ago. lmp-02/07/15.

## 2015-03-23 NOTE — Discharge Instructions (Signed)
Constipation °Constipation is when a person has fewer than three bowel movements a week, has difficulty having a bowel movement, or has stools that are dry, hard, or larger than normal. As people grow older, constipation is more common. If you try to fix constipation with medicines that make you have a bowel movement (laxatives), the problem may get worse. Long-term laxative use may cause the muscles of the colon to become weak. A low-fiber diet, not taking in enough fluids, and taking certain medicines may make constipation worse.  °CAUSES  °· Certain medicines, such as antidepressants, pain medicine, iron supplements, antacids, and water pills.   °· Certain diseases, such as diabetes, irritable bowel syndrome (IBS), thyroid disease, or depression.   °· Not drinking enough water.   °· Not eating enough fiber-rich foods.   °· Stress or travel.   °· Lack of physical activity or exercise.   °· Ignoring the urge to have a bowel movement.   °· Using laxatives too much.   °SIGNS AND SYMPTOMS  °· Having fewer than three bowel movements a week.   °· Straining to have a bowel movement.   °· Having stools that are hard, dry, or larger than normal.   °· Feeling full or bloated.   °· Pain in the lower abdomen.   °· Not feeling relief after having a bowel movement.   °DIAGNOSIS  °Your health care provider will take a medical history and perform a physical exam. Further testing may be done for severe constipation. Some tests may include: °· A barium enema X-ray to examine your rectum, colon, and, sometimes, your small intestine.   °· A sigmoidoscopy to examine your lower colon.   °· A colonoscopy to examine your entire colon. °TREATMENT  °Treatment will depend on the severity of your constipation and what is causing it. Some dietary treatments include drinking more fluids and eating more fiber-rich foods. Lifestyle treatments may include regular exercise. If these diet and lifestyle recommendations do not help, your health care  provider may recommend taking over-the-counter laxative medicines to help you have bowel movements. Prescription medicines may be prescribed if over-the-counter medicines do not work.  °HOME CARE INSTRUCTIONS  °· Eat foods that have a lot of fiber, such as fruits, vegetables, whole grains, and beans. °· Limit foods high in fat and processed sugars, such as french fries, hamburgers, cookies, candies, and soda.   °· A fiber supplement may be added to your diet if you cannot get enough fiber from foods.   °· Drink enough fluids to keep your urine clear or pale yellow.   °· Exercise regularly or as directed by your health care provider.   °· Go to the restroom when you have the urge to go. Do not hold it.   °· Only take over-the-counter or prescription medicines as directed by your health care provider. Do not take other medicines for constipation without talking to your health care provider first.   °SEEK IMMEDIATE MEDICAL CARE IF:  °· You have bright red blood in your stool.   °· Your constipation lasts for more than 4 days or gets worse.   °· You have abdominal or rectal pain.   °· You have thin, pencil-like stools.   °· You have unexplained weight loss. °MAKE SURE YOU:  °· Understand these instructions. °· Will watch your condition. °· Will get help right away if you are not doing well or get worse. °Document Released: 07/20/2004 Document Revised: 10/27/2013 Document Reviewed: 08/03/2013 °ExitCare® Patient Information ©2015 [employer]. This information is not intended to replace advice given to you by your health care provider. Make sure you discuss any questions   you have with your health care provider.  First Trimester of Pregnancy The first trimester of pregnancy is from week 1 until the end of week 12 (months 1 through 3). A week after a sperm fertilizes an egg, the egg will implant on the wall of the uterus. This embryo will begin to develop into a baby. Genes from you and your partner are forming the  baby. The female genes determine whether the baby is a boy or a girl. At 6-8 weeks, the eyes and face are formed, and the heartbeat can be seen on ultrasound. At the end of 12 weeks, all the baby's organs are formed.  Now that you are pregnant, you will want to do everything you can to have a healthy baby. Two of the most important things are to get good prenatal care and to follow your health care provider's instructions. Prenatal care is all the medical care you receive before the baby's birth. This care will help prevent, find, and treat any problems during the pregnancy and childbirth. BODY CHANGES Your body goes through many changes during pregnancy. The changes vary from woman to woman.   You may gain or lose a couple of pounds at first.  You may feel sick to your stomach (nauseous) and throw up (vomit). If the vomiting is uncontrollable, call your health care provider.  You may tire easily.  You may develop headaches that can be relieved by medicines approved by your health care provider.  You may urinate more often. Painful urination may mean you have a bladder infection.  You may develop heartburn as a result of your pregnancy.  You may develop constipation because certain hormones are causing the muscles that push waste through your intestines to slow down.  You may develop hemorrhoids or swollen, bulging veins (varicose veins).  Your breasts may begin to grow larger and become tender. Your nipples may stick out more, and the tissue that surrounds them (areola) may become darker.  Your gums may bleed and may be sensitive to brushing and flossing.  Dark spots or blotches (chloasma, mask of pregnancy) may develop on your face. This will likely fade after the baby is born.  Your menstrual periods will stop.  You may have a loss of appetite.  You may develop cravings for certain kinds of food.  You may have changes in your emotions from day to day, such as being excited to be  pregnant or being concerned that something may go wrong with the pregnancy and baby.  You may have more vivid and strange dreams.  You may have changes in your hair. These can include thickening of your hair, rapid growth, and changes in texture. Some women also have hair loss during or after pregnancy, or hair that feels dry or thin. Your hair will most likely return to normal after your baby is born. WHAT TO EXPECT AT YOUR PRENATAL VISITS During a routine prenatal visit:  You will be weighed to make sure you and the baby are growing normally.  Your blood pressure will be taken.  Your abdomen will be measured to track your baby's growth.  The fetal heartbeat will be listened to starting around week 10 or 12 of your pregnancy.  Test results from any previous visits will be discussed. Your health care provider may ask you:  How you are feeling.  If you are feeling the baby move.  If you have had any abnormal symptoms, such as leaking fluid, bleeding, severe headaches, or abdominal  cramping.  If you have any questions. Other tests that may be performed during your first trimester include:  Blood tests to find your blood type and to check for the presence of any previous infections. They will also be used to check for low iron levels (anemia) and Rh antibodies. Later in the pregnancy, blood tests for diabetes will be done along with other tests if problems develop.  Urine tests to check for infections, diabetes, or protein in the urine.  An ultrasound to confirm the proper growth and development of the baby.  An amniocentesis to check for possible genetic problems.  Fetal screens for spina bifida and Down syndrome.  You may need other tests to make sure you and the baby are doing well. HOME CARE INSTRUCTIONS  Medicines  Follow your health care provider's instructions regarding medicine use. Specific medicines may be either safe or unsafe to take during pregnancy.  Take your  prenatal vitamins as directed.  If you develop constipation, try taking a stool softener if your health care provider approves. Diet  Eat regular, well-balanced meals. Choose a variety of foods, such as meat or vegetable-based protein, fish, milk and low-fat dairy products, vegetables, fruits, and whole grain breads and cereals. Your health care provider will help you determine the amount of weight gain that is right for you.  Avoid raw meat and uncooked cheese. These carry germs that can cause birth defects in the baby.  Eating four or five small meals rather than three large meals a day may help relieve nausea and vomiting. If you start to feel nauseous, eating a few soda crackers can be helpful. Drinking liquids between meals instead of during meals also seems to help nausea and vomiting.  If you develop constipation, eat more high-fiber foods, such as fresh vegetables or fruit and whole grains. Drink enough fluids to keep your urine clear or pale yellow. Activity and Exercise  Exercise only as directed by your health care provider. Exercising will help you:  Control your weight.  Stay in shape.  Be prepared for labor and delivery.  Experiencing pain or cramping in the lower abdomen or low back is a good sign that you should stop exercising. Check with your health care provider before continuing normal exercises.  Try to avoid standing for long periods of time. Move your legs often if you must stand in one place for a long time.  Avoid heavy lifting.  Wear low-heeled shoes, and practice good posture.  You may continue to have sex unless your health care provider directs you otherwise. Relief of Pain or Discomfort  Wear a good support bra for breast tenderness.   Take warm sitz baths to soothe any pain or discomfort caused by hemorrhoids. Use hemorrhoid cream if your health care provider approves.   Rest with your legs elevated if you have leg cramps or low back pain.  If  you develop varicose veins in your legs, wear support hose. Elevate your feet for 15 minutes, 3-4 times a day. Limit salt in your diet. Prenatal Care  Schedule your prenatal visits by the twelfth week of pregnancy. They are usually scheduled monthly at first, then more often in the last 2 months before delivery.  Write down your questions. Take them to your prenatal visits.  Keep all your prenatal visits as directed by your health care provider. Safety  Wear your seat belt at all times when driving.  Make a list of emergency phone numbers, including numbers for family, friends,  the hospital, and police and fire departments. General Tips  Ask your health care provider for a referral to a local prenatal education class. Begin classes no later than at the beginning of month 6 of your pregnancy.  Ask for help if you have counseling or nutritional needs during pregnancy. Your health care provider can offer advice or refer you to specialists for help with various needs.  Do not use hot tubs, steam rooms, or saunas.  Do not douche or use tampons or scented sanitary pads.  Do not cross your legs for long periods of time.  Avoid cat litter boxes and soil used by cats. These carry germs that can cause birth defects in the baby and possibly loss of the fetus by miscarriage or stillbirth.  Avoid all smoking, herbs, alcohol, and medicines not prescribed by your health care provider. Chemicals in these affect the formation and growth of the baby.  Schedule a dentist appointment. At home, brush your teeth with a soft toothbrush and be gentle when you floss. SEEK MEDICAL CARE IF:   You have dizziness.  You have mild pelvic cramps, pelvic pressure, or nagging pain in the abdominal area.  You have persistent nausea, vomiting, or diarrhea.  You have a bad smelling vaginal discharge.  You have pain with urination.  You notice increased swelling in your face, hands, legs, or ankles. SEEK  IMMEDIATE MEDICAL CARE IF:   You have a fever.  You are leaking fluid from your vagina.  You have spotting or bleeding from your vagina.  You have severe abdominal cramping or pain.  You have rapid weight gain or loss.  You vomit blood or material that looks like coffee grounds.  You are exposed to [place] and have never had them.  You are exposed to fifth disease or chickenpox.  You develop a severe headache.  You have shortness of breath.  You have any kind of trauma, such as from a fall or a car accident. Document Released: 10/16/2001 Document Revised: 03/08/2014 Document Reviewed: 09/01/2013 [facility] Patient Information 2015 [place], [place]. This information is not intended to replace advice given to you by your health care provider. Make sure you discuss any questions you have with your health care provider.

## 2015-04-05 ENCOUNTER — Encounter (HOSPITAL_COMMUNITY): Payer: Self-pay | Admitting: Emergency Medicine

## 2015-04-05 ENCOUNTER — Emergency Department (HOSPITAL_COMMUNITY)
Admission: EM | Admit: 2015-04-05 | Discharge: 2015-04-05 | Disposition: A | Discharge status: Home / Self Care | Payer: Medicaid Other | Attending: Emergency Medicine | Admitting: Emergency Medicine

## 2015-04-05 DIAGNOSIS — Z87891 Personal history of nicotine dependence: Secondary | ICD-10-CM | POA: Diagnosis not present

## 2015-04-05 DIAGNOSIS — Z8659 Personal history of other mental and behavioral disorders: Secondary | ICD-10-CM | POA: Insufficient documentation

## 2015-04-05 DIAGNOSIS — J45909 Unspecified asthma, uncomplicated: Secondary | ICD-10-CM | POA: Diagnosis not present

## 2015-04-05 DIAGNOSIS — Z3490 Encounter for supervision of normal pregnancy, unspecified, unspecified trimester: Secondary | ICD-10-CM

## 2015-04-05 DIAGNOSIS — Z3201 Encounter for pregnancy test, result positive: Secondary | ICD-10-CM | POA: Insufficient documentation

## 2015-04-05 DIAGNOSIS — Z3200 Encounter for pregnancy test, result unknown: Secondary | ICD-10-CM | POA: Diagnosis present

## 2015-04-05 LAB — URINALYSIS, ROUTINE W REFLEX MICROSCOPIC
Bilirubin Urine: NEGATIVE
Glucose, UA: NEGATIVE mg/dL
Hgb urine dipstick: NEGATIVE
Leukocytes, UA: NEGATIVE
Nitrite: NEGATIVE
Protein, ur: NEGATIVE mg/dL
Specific Gravity, Urine: 1.025 (ref 1.005–1.030)
Urobilinogen, UA: 0.2 mg/dL (ref 0.0–1.0)
pH: 6 (ref 5.0–8.0)

## 2015-04-05 LAB — POC URINE PREG, ED: Preg Test, Ur: POSITIVE — AB

## 2015-04-05 NOTE — ED Notes (Signed)
Last month period April 8th, pt has taken a pregnancy test with positive result, pt states she can not get medicaid to see OB till she has confirmed pregnancy test from a doctor.

## 2015-04-05 NOTE — Discharge Instructions (Signed)
First Trimester of Pregnancy The first trimester of pregnancy is from week 1 until the end of week 12 (months 1 through 3). During this time, your baby will begin to develop inside you. At 6-8 weeks, the eyes and face are formed, and the heartbeat can be seen on ultrasound. At the end of 12 weeks, all the baby's organs are formed. Prenatal care is all the medical care you receive before the birth of your baby. Make sure you get good prenatal care and follow all of your doctor's instructions. HOME CARE  Medicines  Take medicine only as told by your doctor. Some medicines are safe and some are not during pregnancy.  Take your prenatal vitamins as told by your doctor.  Take medicine that helps you poop (stool softener) as needed if your doctor says it is okay. Diet  Eat regular, healthy meals.  Your doctor will tell you the amount of weight gain that is right for you.  Avoid raw meat and uncooked cheese.  If you feel sick to your stomach (nauseous) or throw up (vomit):  Eat 4 or 5 small meals a day instead of 3 large meals.  Try eating a few soda crackers.  Drink liquids between meals instead of during meals.  If you have a hard time pooping (constipation):  Eat high-fiber foods like fresh vegetables, fruit, and whole grains.  Drink enough fluids to keep your pee (urine) clear or pale yellow. Activity and Exercise  Exercise only as told by your doctor. Stop exercising if you have cramps or pain in your lower belly (abdomen) or low back.  Try to avoid standing for long periods of time. Move your legs often if you must stand in one place for a long time.  Avoid heavy lifting.  Wear low-heeled shoes. Sit and stand up straight.  You can have sex unless your doctor tells you not to. Relief of Pain or Discomfort  Wear a good support bra if your breasts are sore.  Take warm water baths (sitz baths) to soothe pain or discomfort caused by hemorrhoids. Use hemorrhoid cream if your  doctor says it is okay.  Rest with your legs raised if you have leg cramps or low back pain.  Wear support hose if you have puffy, bulging veins (varicose veins) in your legs. Raise (elevate) your feet for 15 minutes, 3-4 times a day. Limit salt in your diet. Prenatal Care  Schedule your prenatal visits by the twelfth week of pregnancy.  Write down your questions. Take them to your prenatal visits.  Keep all your prenatal visits as told by your doctor. Safety  Wear your seat belt at all times when driving.  Make a list of emergency phone numbers. The list should include numbers for family, friends, the hospital, and police and fire departments. General Tips  Ask your doctor for a referral to a local prenatal class. Begin classes no later than at the start of month 6 of your pregnancy.  Ask for help if you need counseling or help with nutrition. Your doctor can give you advice or tell you where to go for help.  Do not use hot tubs, steam rooms, or saunas.  Do not douche or use tampons or scented sanitary pads.  Do not cross your legs for long periods of time.  Avoid litter boxes and soil used by cats.  Avoid all smoking, herbs, and alcohol. Avoid drugs not approved by your doctor.  Visit your dentist. At home, brush your teeth   with a soft toothbrush. Be gentle when you floss. GET HELP IF:  You are dizzy.  You have mild cramps or pressure in your lower belly.  You have a nagging pain in your belly area.  You continue to feel sick to your stomach, throw up, or have watery poop (diarrhea).  You have a bad smelling fluid coming from your vagina.  You have pain with peeing (urination).  You have increased puffiness (swelling) in your face, hands, legs, or ankles. GET HELP RIGHT AWAY IF:   You have a fever.  You are leaking fluid from your vagina.  You have spotting or bleeding from your vagina.  You have very bad belly cramping or pain.  You gain or lose weight  rapidly.  You throw up blood. It may look like coffee grounds.  You are around people who have German measles, fifth disease, or chickenpox.  You have a very bad headache.  You have shortness of breath.  You have any kind of trauma, such as from a fall or a car accident. Document Released: 04/09/2008 Document Revised: 03/08/2014 Document Reviewed: 09/01/2013 [employer] Patient Information 2015 [employer]. This information is not intended to replace advice given to you by your health care provider. Make sure you discuss any questions you have with your health care provider.  

## 2015-04-07 NOTE — ED Provider Notes (Signed)
CSN: [id number]     Arrival date & time 04/05/15  1307 History   First MD Initiated Contact with Patient 04/05/15 1358     Chief Complaint  Patient presents with  . Possible Pregnancy     (Consider location/radiation/quality/duration/timing/severity/associated sxs/prior Treatment) HPI   Katie Briggs is a 24 y.o. female who presents to the Emergency Department requesting a pregnancy test.  She states that she took a home pregnancy test that was positive and tried to schedule an appt with the health dept, but could not be seen because "they didn't have a recent physical exam on me" and states she doesn't have Medicaid and could not see an OB either.  She is requesting a pregnancy test.  She denies abdominal pain, vomiting, fever, vaginal bleeding or history of previous ectopic pregnancy.     Past Medical History  Diagnosis Date  . Asthma   . Depression   . OCD (obsessive compulsive disorder)   . Bipolar disorder   . Anxiety    Past Surgical History  Procedure Laterality Date  . None    . Colonoscopy N/A 11/12/2013    Procedure: COLONOSCOPY;  Surgeon: Corbin Ade, MD;  Location: [facility];  Service: Endoscopy;  Laterality: N/A;  2:40  . Dilation and curettage of uterus     Family History  Problem Relation Age of Onset  . Colon polyps Maternal Grandmother   . Colon cancer      great grandmother   History  Substance Use Topics  . Smoking status: Former Games developer  . Smokeless tobacco: Not on file  . Alcohol Use: No   OB History    Gravida Para Term Preterm AB TAB SAB Ectopic Multiple Living   Review of Systems  Constitutional: Negative for fever.  Gastrointestinal: Negative for nausea, vomiting and abdominal pain.  Genitourinary: Negative for dysuria, frequency, decreased urine volume, vaginal bleeding, vaginal discharge, vaginal pain and pelvic pain.  All other systems reviewed and are negative.     Allergies  Review of patient's allergies  indicates no known allergies.  Home Medications   Prior to Admission medications   Medication Sig Start Date End Date Taking? Authorizing Provider  albuterol (PROVENTIL HFA;VENTOLIN HFA) 108 (90 BASE) MCG/ACT inhaler Inhale 2 puffs into the lungs every 6 (six) hours as needed.   Yes Historical Provider, MD  docusate sodium (COLACE) 100 MG capsule Take 1 capsule (100 mg total) by mouth 2 (two) times daily as needed for mild constipation or moderate constipation. Patient not taking: Reported on 04/05/2015 03/23/15   Benjiman Core, MD  ibuprofen (ADVIL,MOTRIN) 600 MG tablet Take 1 tablet (600 mg total) by mouth every 6 (six) hours as needed. Patient not taking: Reported on 03/23/2015 11/05/14   Aviva Signs, CNM   BP 105/67 mmHg  Pulse 88  Temp(Src) 98.6 F (37 C) (Oral)  Resp 15  Ht  (1.549 m)  Wt 172 lb 1.6 oz (78.064 kg)  BMI 32.53 kg/m2  SpO2 99%  LMP 02/11/2015 Physical Exam  Constitutional: She appears well-developed and well-nourished. No distress.  HENT:  Head: Normocephalic and atraumatic.  Cardiovascular: Normal rate, regular rhythm and normal heart sounds.   No murmur heard. Pulmonary/Chest: Effort normal and breath sounds normal. No respiratory distress.  Abdominal: Soft. She exhibits no distension and no mass. There is no tenderness. There is no rebound and no guarding.  Musculoskeletal: Normal range  of motion.  Neurological: She is alert. She exhibits normal muscle tone. Coordination normal.  Skin: Skin is warm and dry.  Nursing note and vitals reviewed.   ED Course  Procedures (including critical care time) Labs Review Labs Reviewed  URINALYSIS, ROUTINE W REFLEX MICROSCOPIC ([facility]) - Abnormal; Notable for the following:    APPearance HAZY (*)    Ketones, ur TRACE (*)    All other components within normal limits  POC URINE PREG, ED - Abnormal; Notable for the following:    Preg Test, Ur POSITIVE (*)    All other components within normal limits     Imaging Review No results found.   EKG Interpretation None      MDM   Final diagnoses:  Pregnant    Pt asymptomatic.  Vitals stable.  Admits to being here only for pregnancy test and documentation to take the social service.    No reported abdominal pain or vaginal bleeding.    I have advised patient that she will need to f/u with OB/GYN or the health dept and that her pregnancy test today is only being done as a courtesy.  Advised to return here for any abdominal pain or vaginal bleeding.  Pt agrees to plan.     Pauline Ausammy Chanin Frumkin, PA-C 04/07/15 1758  Raeford RazorStephen Kohut, MD 04/14/15 1355

## 2015-04-08 ENCOUNTER — Ambulatory Visit: Payer: Self-pay | Admitting: Adult Health

## 2015-04-14 ENCOUNTER — Other Ambulatory Visit: Payer: Self-pay

## 2015-04-15 ENCOUNTER — Other Ambulatory Visit: Payer: Self-pay

## 2015-04-26 ENCOUNTER — Other Ambulatory Visit: Payer: Self-pay | Admitting: Obstetrics & Gynecology

## 2015-04-26 DIAGNOSIS — O3680X1 Pregnancy with inconclusive fetal viability, fetus 1: Secondary | ICD-10-CM

## 2015-04-27 ENCOUNTER — Ambulatory Visit (INDEPENDENT_AMBULATORY_CARE_PROVIDER_SITE_OTHER): Payer: Medicaid Other

## 2015-04-27 ENCOUNTER — Other Ambulatory Visit: Payer: Self-pay | Admitting: Obstetrics & Gynecology

## 2015-04-27 ENCOUNTER — Other Ambulatory Visit: Payer: Self-pay

## 2015-04-27 DIAGNOSIS — O3680X1 Pregnancy with inconclusive fetal viability, fetus 1: Secondary | ICD-10-CM

## 2015-04-27 DIAGNOSIS — O283 Abnormal ultrasonic finding on antenatal screening of mother: Secondary | ICD-10-CM

## 2015-04-27 DIAGNOSIS — Z3682 Encounter for antenatal screening for nuchal translucency: Secondary | ICD-10-CM

## 2015-04-27 DIAGNOSIS — Z369 Encounter for antenatal screening, unspecified: Secondary | ICD-10-CM

## 2015-04-27 NOTE — Progress Notes (Signed)
[place] 11+2wks single IUP pos fht 168bpm,normal ov's bilat,nb present,nt .82,crl 46.51mm

## 2015-04-29 LAB — MATERNAL SCREEN, INTEGRATED #1
Crown Rump Length: 46.9 mm
Gest. Age on Collection Date: 11.4 weeks
Maternal Age at EDD: 26.1 years
Nuchal Translucency (NT): 0.8 mm
Number of Fetuses: 1
PAPP-A Value: 224.4 ng/mL
Weight: 173 [lb_av]

## 2015-05-05 ENCOUNTER — Encounter (HOSPITAL_COMMUNITY): Payer: Self-pay | Admitting: Emergency Medicine

## 2015-05-05 ENCOUNTER — Emergency Department (HOSPITAL_COMMUNITY)
Admission: EM | Admit: 2015-05-05 | Discharge: 2015-05-05 | Disposition: A | Discharge status: Home / Self Care | Payer: Medicaid Other | Attending: Emergency Medicine | Admitting: Emergency Medicine

## 2015-05-05 DIAGNOSIS — Z3A12 12 weeks gestation of pregnancy: Secondary | ICD-10-CM | POA: Diagnosis not present

## 2015-05-05 DIAGNOSIS — W57XXXA Bitten or stung by nonvenomous insect and other nonvenomous arthropods, initial encounter: Secondary | ICD-10-CM | POA: Insufficient documentation

## 2015-05-05 DIAGNOSIS — Y9384 Activity, sleeping: Secondary | ICD-10-CM | POA: Diagnosis not present

## 2015-05-05 DIAGNOSIS — J45909 Unspecified asthma, uncomplicated: Secondary | ICD-10-CM | POA: Diagnosis not present

## 2015-05-05 DIAGNOSIS — Y998 Other external cause status: Secondary | ICD-10-CM | POA: Diagnosis not present

## 2015-05-05 DIAGNOSIS — Z87891 Personal history of nicotine dependence: Secondary | ICD-10-CM | POA: Diagnosis not present

## 2015-05-05 DIAGNOSIS — O9A211 Injury, poisoning and certain other consequences of external causes complicating pregnancy, first trimester: Secondary | ICD-10-CM | POA: Diagnosis present

## 2015-05-05 DIAGNOSIS — T148 Other injury of unspecified body region: Secondary | ICD-10-CM | POA: Diagnosis not present

## 2015-05-05 DIAGNOSIS — Z79899 Other long term (current) drug therapy: Secondary | ICD-10-CM | POA: Insufficient documentation

## 2015-05-05 DIAGNOSIS — Y92009 Unspecified place in unspecified non-institutional (private) residence as the place of occurrence of the external cause: Secondary | ICD-10-CM | POA: Insufficient documentation

## 2015-05-05 DIAGNOSIS — L299 Pruritus, unspecified: Secondary | ICD-10-CM | POA: Insufficient documentation

## 2015-05-05 DIAGNOSIS — O99711 Diseases of the skin and subcutaneous tissue complicating pregnancy, first trimester: Secondary | ICD-10-CM | POA: Diagnosis not present

## 2015-05-05 DIAGNOSIS — R21 Rash and other nonspecific skin eruption: Secondary | ICD-10-CM

## 2015-05-05 MED ORDER — HYDROCORTISONE 2.5 % EX LOTN: TOPICAL_LOTION | Freq: Two times a day (BID) | CUTANEOUS | Status: DC
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
## 2015-05-05 NOTE — ED Notes (Signed)
Pt st's after staying at her grandmothers house last night she was told that there was bed bugs.  Today pt has rash on arms, lower back and behind knees.  Pt st's she is 12 weeks preg.

## 2015-05-05 NOTE — Discharge Instructions (Signed)
1. Medications: hydrocortisone, usual home medications 2. Treatment: rest, drink plenty of fluids, take benadryl as needed for severe itching over the next 24 hours 3. Follow Up: Please followup with your primary doctor in 2-3 days for discussion of your diagnoses and further evaluation after today's visit; if you do not have a primary care doctor use the resource guide provided to find one; Please return to the ER for worsening symptoms    Bedbugs Bedbugs are tiny bugs that live in and around beds. During the day, they hide in mattresses and other places near beds. They come out at night and bite people lying in bed. They need blood to live and grow. Bedbugs can be found in beds anywhere. Usually, they are found in places where many people come and go (hotels, shelters, hospitals). It does not matter whether the place is dirty or clean. Getting bitten by bedbugs rarely causes a medical problem. The biggest problem can be getting rid of them. This often takes the work of a Oncologist. CAUSES  Less use of pesticides. Bedbugs were common before the 1950s. Then, strong pesticides such as DDT nearly wiped them out. Today, these pesticides are not used because they harm the environment and can cause health problems.  More travel. Besides mattresses, bedbugs can also live in clothing and luggage. They can come along as people travel from place to place. Bedbugs are more common in certain parts of the world. When people travel to those areas, the bugs can come home with them.  Presence of birds and bats. Bedbugs often infest birds and bats. If you have these animals in or near your home, bedbugs may infest your house, too. SYMPTOMS It does not hurt to be bitten by a bedbug. You will probably not wake up when you are bitten. Bedbugs usually bite areas of the skin that are not covered. Symptoms may show when you wake up, or they may take a day or more to show up. Symptoms may include:  Small red  bumps on the skin. These might be lined up in a row or clustered in a group.  A darker red dot in the middle of red bumps.  Blisters on the skin. There may be swelling and very bad itching. These may be signs of an allergic reaction. This does not happen often. DIAGNOSIS Bedbug bites might look and feel like other types of insect bites. The bugs do not stay on the body like ticks or lice. They bite, drop off, and crawl away to hide. Your caregiver will probably:  Ask about your symptoms.  Ask about your recent activities and travel.  Check your skin for bedbug bites.  Ask you to check at home for signs of bedbugs. You should look for:  Spots or stains on the bed or nearby. This could be from bedbugs that were crushed or from their eggs or waste.  Bedbugs themselves. They are reddish-brown, oval, and flat. They do not fly. They are about the size of an apple seed.  Places to look for bedbugs include:  Beds. Check mattresses, headboards, box springs, and bed frames.  On drapes and curtains near the bed.  Under carpeting in the bedroom.  Behind electrical outlets.  Behind any wallpaper that is peeling.  Inside luggage. TREATMENT Most bedbug bites do not need treatment. They usually go away on their own in a few days. The bites are not dangerous. However, treatment may be needed if you have scratched so much  that your skin has become infected. You may also need treatment if you are allergic to bedbug bites. Treatment options include:  A drug that stops swelling and itching (corticosteroid). Usually, a cream is rubbed on the skin. If you have a bad rash, you may be given a corticosteroid pill.  Oral antihistamines. These are pills to help control itching.  Antibiotic medicines. An antibiotic may be prescribed for infected skin. HOME CARE INSTRUCTIONS   Take any medicine prescribed by your caregiver for your bites. Follow the directions carefully.  Consider wearing pajamas with  long sleeves and pant legs.  Your bedroom may need to be treated. A pest control expert should make sure the bedbugs are gone. You may need to throw away mattresses or luggage. Ask the pest control expert what you can do to keep the bedbugs from coming back. Common suggestions include:  Putting a plastic cover over your mattress.  Washing and drying your clothes and bedding in hot water and a hot dryer. The temperature should be hotter than 120 F (48.9 C). Bedbugs are killed by high temperatures.  Vacuuming carefully all around your bed. Vacuum in all cracks and crevices where the bugs might hide. Do this often.  Carefully checking all used furniture, bedding, or clothes that you bring into your house.  Eliminating bird nests and bat roosts.  If you get bedbug bites when traveling, check all your possessions carefully before bringing them into your house. If you find any bugs on clothes or in your luggage, consider throwing those items away. SEEK MEDICAL CARE IF:  You have red bug bites that keep coming back.  You have red bug bites that itch badly.  You have bug bites that cause a skin rash.  You have scratch marks that are red and sore. SEEK IMMEDIATE MEDICAL CARE IF: You have a fever. Document Released: 11/24/2010 Document Revised: 01/14/2012 Document Reviewed: 11/24/2010 [facility] Patient Information 2015 [place], [place]. This information is not intended to replace advice given to you by your health care provider. Make sure you discuss any questions you have with your health care provider.    Emergency Department Resource Guide 1) Find a Doctor and Pay Out of Pocket Although you won't have to find out who is covered by your insurance plan, it is a good idea to ask around and get recommendations. You will then need to call the office and see if the doctor you have chosen will accept you as a new patient and what types of options they offer for patients who are self-pay. Some  doctors offer discounts or will set up payment plans for their patients who do not have insurance, but you will need to ask so you aren't surprised when you get to your appointment.  2) Contact Your Local Health Department Not all health departments have doctors that can see patients for sick visits, but many do, so it is worth a call to see if yours does. If you don't know where your local health department is, you can check in your phone book. The CDC also has a tool to help you locate your state's health department, and many state websites also have listings of all of their local health departments.  3) Find a Walk-in Clinic If your illness is not likely to be very severe or complicated, you may want to try a walk in clinic. These are popping up all over the country in pharmacies, drugstores, and shopping centers. They're usually staffed by nurse practitioners or  physician assistants that have been trained to treat common illnesses and complaints. They're usually fairly quick and inexpensive. However, if you have serious medical issues or chronic medical problems, these are probably not your best option.  No Primary Care Doctor: - Call Health Connect at  [phone] - they can help you locate a primary care doctor that  accepts your insurance, provides certain services, etc. - Physician Referral Service- [phone]  Chronic Pain Problems: Organization         Address  Phone   Notes  [facility]  [phone] Patients need to be referred by their primary care doctor.   Medication Assistance: Organization         Address  Phone   Notes  [facility] Medication [facility] [address] [place], [place] [address] [phone] --Must be a resident of [facility] -- Must have NO insurance coverage whatsoever (no Medicaid/ Medicare, etc.) -- The pt. MUST have a primary care doctor that directs their care regularly and follows them in the  community   MedAssist  [phone]   [employer]  [phone]    Agencies that provide inexpensive medical care: Organization         Address  Phone   Notes  [place]  [phone]   [facility]    [phone]   [facility] [place], [place] [address] [phone]   [place] [address], [place] [phone]   Planned Parenthood    [phone]   [facility]    [phone]   [place] [address], [place] Phone:  [phone], Fax:  [phone] Hours of Operation:  9 am - 6 pm, M-F.  Also accepts Medicaid/Medicare and self-pay.  [facility] for [place], [address], [place] Phone: [phone], Fax: [phone]. Hours of Operation:  8:30 am - 5:30 pm, M-F.  Also accepts Medicaid and self-pay.  [facility] [place] [address], [place] Phone: [phone]   [place], [place], [place] [phone], Ext. [phone] Mondays & Thursdays: 7-9 AM.  First 15 patients are seen on a first come, first serve basis.    [place] Providers:  Organization         Address  Phone   Notes  [facility] [address], [place] [phone] Also accepts self-pay patients.  [facility] [address], [place]  [phone]   [place], [address], [place] [phone]   [facility] [address] [place] [phone]   Lucianne Lei [address], [place]   [phone] Only accepts [place] Access [place] patients after they have their name applied to their card.   Self-Pay (no insurance) in [facility]:  Organization         Address  Phone   Notes  Sickle Cell Patients, [facility]  Internal Medicine [place] [phone]   [facility] [place] [phone]   [facility] [place]  [address], [place] [phone]   [facility]/Dr. Vista Lawman  [address], [place] or [address], [place] [phone] Phone number for both [place] and [place] locations is the same.  [facility] [address] [place] [phone]   [facility] [address], [place] or [address] [phone]   [facility] [address], [place] [phone], phone; [phone], fax Sees patients 1st and 3rd Saturday of every month.  Must not qualify for public or private insurance (i.e. Medicaid, Medicare, [place] Health Choice, Veterans' Benefits)  Household income should be no more than 200% of the poverty level The clinic cannot treat you if you are pregnant or think you are pregnant  Sexually transmitted diseases are not treated at the clinic.    Dental Care: Organization         Address  Phone  Notes  [facility] Department of [facility] [place] [phone] Accepts children up to age 81 who are enrolled in [place] or [place]; pregnant women with a Medicaid card; and children who have applied for Medicaid or [place] Health Choice, but were declined, whose parents can pay a reduced fee at time of service.  [facility] Department of [facility]  [address], [place] [phone] Accepts children up to age 25 who are enrolled in [place] or [place]; pregnant women with a Medicaid card; and children who have applied for Medicaid or [place] Health Choice, but were declined, whose parents can pay a reduced fee at time of service.  [place] Adult Dental Access PROGRAM  [place] [phone] Patients are seen by appointment only. Walk-ins are not accepted. [place] will see patients 76 years of age and older. Monday - Tuesday (8am-5pm) Most Wednesdays (8:30-5pm) $30 per visit, cash only  [facility] Adult Dental Access PROGRAM  [address], [facility] [phone] Patients are seen by appointment only. Walk-ins are not accepted. [place] will see patients 53 years of age and older. One Wednesday Evening (Monthly: Volunteer Based).  $30 per visit, cash only  [place]  [phone] for adults; Children under age 26, call Graduate Pediatric Dentistry at [phone]. Children aged 55-14, please call [phone] to request a pediatric application.  Dental services are provided in all areas of dental care including fillings, crowns and bridges, complete and partial dentures, implants, gum treatment, root canals, and extractions. Preventive care is also provided. Treatment is provided to both adults and children. Patients are selected via a lottery and there is often a waiting list.   [facility] [address], [place]  [phone] [email or web address]   [facility] [address] [place], [place] [phone], Ext. [phone] Second and Fourth Thursday of each month, opens at 6:30 AM; Clinic ends at 9 AM.  Patients are seen on a first-come first-served basis, and a limited number are seen during each clinic.   [facility]  [address] Hillard Danker [place], [place] [phone]   Eligibility Requirements You must have lived in [place], [place], or [place] counties for at least the last three months.   You cannot be eligible for state or federal sponsored [employer], including [employer], [place], or [employer].   You generally cannot be eligible for healthcare insurance through your employer.    How to  apply: Eligibility screenings are held every  Tuesday and Wednesday afternoon from 1:00 pm until 4:00 pm. You do not need an appointment for the interview!  [facility] [address] [place], [place]   [place]  [place] Department  [place]  [phone]    Behavioral Health Resources in the Community: Intensive Outpatient Programs Organization         Address  Phone  Notes  [place]. [address] [place], [place] [phone]   [facility] Outpatient [address], [place], [place]   ADS: Alcohol & Drug Svcs [address] [place], [place]   [place] [address],  [place], [place] or [phone]   Substance Abuse Resources Organization         Address  Phone  Notes  Alcohol and Drug Services  [phone]   [place]  [phone]   The [place]   Chinita Pester  [phone]   Residential & Outpatient Substance Abuse Program  [phone]   Psychological Services Organization         Address  Phone  Notes  [facility] [place]  [place]  [phone]   [place] [address], [place] or [phone]    Mobile Crisis Teams Organization         Address  Phone  Notes  [facility], Mobile Crisis Care Unit  [phone]   [facility]  [address] [place], [place]   [place] [address] [place] [phone]    Self-Help/Support Groups Organization         Address  Phone             Notes  [place]. of [place] - variety of support groups  [place] Call for more information  Narcotics Anonymous (NA), [facility]aring [facility] [address], [employer] [place]  2 meetings at this location    Special educational needs teacher         Address  Phone  Notes  ASAP Residential Treatment [place],    [place]  [phone]   [facility]  [address] [place] [address]333, [place], [place]   [place], [place] [phone] Admissions: 8am-3pm M-F  Incentives Substance [place] [address]    [place], [place] [phone]   [facility] [address] [place], [place], [place]   [facility] [address],  [place], [place]   [facility]ight Programs - Intensive Outpatient [address], [place], [place]   [facility] ([place]  [place], [place] [phone] or [phone]   [facility] Services (RTS) [address] [place], [place] Accepts Medicaid  [facility] [place] [address],  [place] [phone] Substance Abuse/Addiction Treatment   [facility] Organization         Address  Phone  Notes  [facility]  [phone]   Domenic Schwab, PhD [address] Arlis Porta [place], [place]   [phone] or [phone]   [place]   [address] [place], [place] [phone]   [facility] Recovery [place] [address] [place],  [place] [phone]) [phone] Insurance/Medicaid/sponsorship through [facility]  [facility] and Families [address]                                    [place], [place] [phone]) [phone] Therapy/tele-psych/case  [facility] [address]   [place], [place] [phone]) [phone]    Dr. Lolly MustacheArfeen  [phone]) [phone]   [facility] [place]  [employer] [facility] 1) [address], [place] 2) [address], [place] 3)  [address], [place] [phone]) [phone]) [phone]  [phone](336) [phone]   [facility] Child Abuse Hotline [phone]) [phone] or [phone](336) [phone] (After  Hours)

## 2015-05-11 ENCOUNTER — Encounter: Payer: Self-pay | Admitting: Adult Health

## 2015-05-11 ENCOUNTER — Ambulatory Visit (INDEPENDENT_AMBULATORY_CARE_PROVIDER_SITE_OTHER): Payer: Medicaid Other | Admitting: Adult Health

## 2015-05-11 ENCOUNTER — Other Ambulatory Visit (HOSPITAL_COMMUNITY)
Admission: RE | Admit: 2015-05-11 | Discharge: 2015-05-11 | Disposition: A | Discharge status: Home / Self Care | Payer: Medicaid Other | Source: Ambulatory Visit | Attending: Obstetrics and Gynecology | Admitting: Obstetrics and Gynecology

## 2015-05-11 VITALS — BP 100/58 | HR 92 | Wt 172.5 lb

## 2015-05-11 DIAGNOSIS — Z3492 Encounter for supervision of normal pregnancy, unspecified, second trimester: Secondary | ICD-10-CM

## 2015-05-11 DIAGNOSIS — Z3482 Encounter for supervision of other normal pregnancy, second trimester: Secondary | ICD-10-CM

## 2015-05-11 DIAGNOSIS — Z01419 Encounter for gynecological examination (general) (routine) without abnormal findings: Secondary | ICD-10-CM | POA: Insufficient documentation

## 2015-05-11 DIAGNOSIS — Z331 Pregnant state, incidental: Secondary | ICD-10-CM

## 2015-05-11 DIAGNOSIS — Z113 Encounter for screening for infections with a predominantly sexual mode of transmission: Secondary | ICD-10-CM | POA: Insufficient documentation

## 2015-05-11 DIAGNOSIS — Z369 Encounter for antenatal screening, unspecified: Secondary | ICD-10-CM

## 2015-05-11 DIAGNOSIS — Z3490 Encounter for supervision of normal pregnancy, unspecified, unspecified trimester: Secondary | ICD-10-CM | POA: Insufficient documentation

## 2015-05-11 DIAGNOSIS — Z1389 Encounter for screening for other disorder: Secondary | ICD-10-CM

## 2015-05-11 DIAGNOSIS — Z1329 Encounter for screening for other suspected endocrine disorder: Secondary | ICD-10-CM

## 2015-05-11 HISTORY — DX: Encounter for supervision of normal pregnancy, unspecified, second trimester: Z34.92

## 2015-05-11 LAB — POCT URINALYSIS DIPSTICK
Blood, UA: NEGATIVE
Glucose, UA: NEGATIVE
Ketones, UA: NEGATIVE
Leukocytes, UA: NEGATIVE
Nitrite, UA: NEGATIVE
Protein, UA: NEGATIVE

## 2015-05-11 NOTE — Patient Instructions (Signed)
Second Trimester of Pregnancy The second trimester is from week 13 through week 28, months 4 through 6. The second trimester is often a time when you feel your best. Your body has also adjusted to being pregnant, and you begin to feel better physically. Usually, morning sickness has lessened or quit completely, you may have more energy, and you may have an increase in appetite. The second trimester is also a time when the fetus is growing rapidly. At the end of the sixth month, the fetus is about 9 inches long and weighs about 1 pounds. You will likely begin to feel the baby move (quickening) between 18 and 20 weeks of the pregnancy. BODY CHANGES Your body goes through many changes during pregnancy. The changes vary from woman to woman.   Your weight will continue to increase. You will notice your lower abdomen bulging out.  You may begin to get stretch marks on your hips, abdomen, and breasts.  You may develop headaches that can be relieved by medicines approved by your health care provider.  You may urinate more often because the fetus is pressing on your bladder.  You may develop or continue to have heartburn as a result of your pregnancy.  You may develop constipation because certain hormones are causing the muscles that push waste through your intestines to slow down.  You may develop hemorrhoids or swollen, bulging veins (varicose veins).  You may have back pain because of the weight gain and pregnancy hormones relaxing your joints between the bones in your pelvis and as a result of a shift in weight and the muscles that support your balance.  Your breasts will continue to grow and be tender.  Your gums may bleed and may be sensitive to brushing and flossing.  Dark spots or blotches (chloasma, mask of pregnancy) may develop on your face. This will likely fade after the baby is born.  A dark line from your belly button to the pubic area (linea nigra) may appear. This will likely fade  after the baby is born.  You may have changes in your hair. These can include thickening of your hair, rapid growth, and changes in texture. Some women also have hair loss during or after pregnancy, or hair that feels dry or thin. Your hair will most likely return to normal after your baby is born. WHAT TO EXPECT AT YOUR PRENATAL VISITS During a routine prenatal visit:  You will be weighed to make sure you and the fetus are growing normally.  Your blood pressure will be taken.  Your abdomen will be measured to track your baby's growth.  The fetal heartbeat will be listened to.  Any test results from the previous visit will be discussed. Your health care provider may ask you:  How you are feeling.  If you are feeling the baby move.  If you have had any abnormal symptoms, such as leaking fluid, bleeding, severe headaches, or abdominal cramping.  If you have any questions. Other tests that may be performed during your second trimester include:  Blood tests that check for:  Low iron levels (anemia).  Gestational diabetes (between 24 and 28 weeks).  Rh antibodies.  Urine tests to check for infections, diabetes, or protein in the urine.  An ultrasound to confirm the proper growth and development of the baby.  An amniocentesis to check for possible genetic problems.  Fetal screens for spina bifida and Down syndrome. HOME CARE INSTRUCTIONS   Avoid all smoking, herbs, alcohol, and unprescribed   drugs. These chemicals affect the formation and growth of the baby.  Follow your health care provider's instructions regarding medicine use. There are medicines that are either safe or unsafe to take during pregnancy.  Exercise only as directed by your health care provider. Experiencing uterine cramps is a good sign to stop exercising.  Continue to eat regular, healthy meals.  Wear a good support bra for breast tenderness.  Do not use hot tubs, steam rooms, or saunas.  Wear your  seat belt at all times when driving.  Avoid raw meat, uncooked cheese, cat litter boxes, and soil used by cats. These carry germs that can cause birth defects in the baby.  Take your prenatal vitamins.  Try taking a stool softener (if your health care provider approves) if you develop constipation. Eat more high-fiber foods, such as fresh vegetables or fruit and whole grains. Drink plenty of fluids to keep your urine clear or pale yellow.  Take warm sitz baths to soothe any pain or discomfort caused by hemorrhoids. Use hemorrhoid cream if your health care provider approves.  If you develop varicose veins, wear support hose. Elevate your feet for 15 minutes, 3-4 times a day. Limit salt in your diet.  Avoid heavy lifting, wear low heel shoes, and practice good posture.  Rest with your legs elevated if you have leg cramps or low back pain.  Visit your dentist if you have not gone yet during your pregnancy. Use a soft toothbrush to brush your teeth and be gentle when you floss.  A sexual relationship may be continued unless your health care provider directs you otherwise.  Continue to go to all your prenatal visits as directed by your health care provider. SEEK MEDICAL CARE IF:   You have dizziness.  You have mild pelvic cramps, pelvic pressure, or nagging pain in the abdominal area.  You have persistent nausea, vomiting, or diarrhea.  You have a bad smelling vaginal discharge.  You have pain with urination. SEEK IMMEDIATE MEDICAL CARE IF:   You have a fever.  You are leaking fluid from your vagina.  You have spotting or bleeding from your vagina.  You have severe abdominal cramping or pain.  You have rapid weight gain or loss.  You have shortness of breath with chest pain.  You notice sudden or extreme swelling of your face, hands, ankles, feet, or legs.  You have not felt your baby move in over an hour.  You have severe headaches that do not go away with  medicine.  You have vision changes. Document Released: 10/16/2001 Document Revised: 10/27/2013 Document Reviewed: 12/23/2012 [facility] Patient Information 2015 [place], [place]. This information is not intended to replace advice given to you by your health care provider. Make sure you discuss any questions you have with your health care provider. Return in 2 weeks for second IT and see fran

## 2015-05-11 NOTE — Progress Notes (Signed)
Subjective:  Katie Briggs is a 24 y.o. G4P2 Caucasian female at [redacted]w[redacted]d by LMP and [place] being seen today for her first obstetrical visit.  Her obstetrical history is significant for pelvic relaxation.  Pregnancy history fully reviewed.Has history of OCD, bipolar and depression, went off meds last year and has been fine, will let us know if needs meds.  Patient reports light headed at times. Denies vb, cramping, uti s/s, abnormal/malodorous vag d/c, or vulvovaginal itching/irritation.  BP 100/58 mmHg  Pulse 92  Wt 172 lb 8 oz (78.245 kg)  LMP 02/07/2015 (Exact Date)  HISTORY: OB History  Gravida Para Term Preterm AB SAB TAB Ectopic Multiple Living  # Outcome Date GA Lbr Len/2nd Weight Sex Delivery Anes PTL Lv  4 Current           3 Gravida           2 Para           1 Para              Past Medical History  Diagnosis Date  . Asthma   . Anxiety   . Cervix prolapsed into vagina   . Depression   . OCD (obsessive compulsive disorder)   . Bipolar disorder   . Supervision of normal pregnancy in second trimester 05/11/2015     [facility] at   05/11/15 FOB Kerney Elbe 24 yo Dating By  LMP and [place] Pap  05/11/15 GC/CT Initial:                36+wks: Genetic Screen NT/IT:  CF screen  Anatomic [place]  Flu vaccine  Tdap Recommended ~ 28wks Glucose Screen  2 hr GBS  Feed Preference  Contraception  Circumcision  Childbirth Classes  Pediatrician     Past Surgical History  Procedure Laterality Date  . None    . Colonoscopy N/A 11/12/2013    Procedure: COLONOSCOPY;  Surgeon: Corbin Ade, MD;  Location: [facility];  Service: Endoscopy;  Laterality: N/A;  2:40  . Dilation and curettage of uterus     Family History  Problem Relation Age of Onset  . Colon polyps Maternal Grandmother   . Depression Maternal Grandmother   . Colon cancer       maternal great grandmother  . Depression Mother   . Hypertension Mother   . Stroke Brother     while in womb  .  Seizures Brother   . Asthma Son     Exam   System:     General: Well developed & nourished, no acute distress   Skin: Warm & dry, normal coloration and turgor, no rashes   Neurologic: Alert & oriented, normal mood   Cardiovascular: Regular rate & rhythm   Respiratory: Effort & rate normal, LCTAB, acyanotic   Abdomen: Soft, non tender   Extremities: normal strength, tone                             thyroid normal Pelvic Exam:    Perineum: Normal perineum   Vulva: Normal, no lesions   Vagina:  Normal mucosa, normal discharge   Cervix: Normal, bulbous, appears closed   Uterus: Normal size/shape/contour for GA   Thin prep pap smear with GC/CHL performed FHR: 154 via doppler   Assessment:   Pregnancy: G4P2 Patient Active Problem List  Diagnosis Date Noted  . Supervision of normal pregnancy in second trimester 05/11/2015  . Rectal bleeding 10/28/2013  . Unspecified constipation 10/28/2013  . Obsessive compulsive disorder 03/10/2012  . Bipolar affective disorder, depressed, severe 03/06/2012    Class: Acute    4158w2d G4P2 New OB visit     Plan:  Initial labs drawn Continue prenatal vitamins Problem list reviewed and updated Reviewed n/v relief measures and warning s/s to report Reviewed recommended weight gain based on pre-gravid BMI Encouraged well-balanced diet Genetic Screening discussed Integrated Screen: requested Cystic fibrosis screening discussed requested Ultrasound discussed; fetal survey: requested Follow up in 2 weeks for second IT and see fran   Adline PotterJennifer A. Griffin, NP 05/11/2015 3:29 PM

## 2015-05-13 LAB — CYTOLOGY - PAP

## 2015-05-13 LAB — URINE CULTURE

## 2015-05-16 LAB — URINALYSIS, ROUTINE W REFLEX MICROSCOPIC
Bilirubin, UA: NEGATIVE
Glucose, UA: NEGATIVE
Ketones, UA: NEGATIVE
Leukocytes, UA: NEGATIVE
Nitrite, UA: NEGATIVE
Protein, UA: NEGATIVE
RBC, UA: NEGATIVE
Specific Gravity, UA: 1.03 (ref 1.005–1.030)
Urobilinogen, Ur: 1 mg/dL (ref 0.2–1.0)
pH, UA: 6 (ref 5.0–7.5)

## 2015-05-16 LAB — RUBELLA SCREEN: Rubella Antibodies, IGG: 3.77 index (ref >0.99)

## 2015-05-16 LAB — CBC
Hematocrit: 42.2 % (ref 34.0–46.6)
Hemoglobin: 13.7 g/dL (ref 11.1–15.9)
MCH: 30.6 pg (ref 26.6–33.0)
MCHC: 32.5 g/dL (ref 31.5–35.7)
MCV: 94 fL (ref 79–97)
Platelets: 308 10*3/uL (ref 150–379)
RBC: 4.47 x10E6/uL (ref 3.77–5.28)
RDW: 14 % (ref 12.3–15.4)
WBC: 10.5 10*3/uL (ref 3.4–10.8)

## 2015-05-16 LAB — HIV ANTIBODY (ROUTINE TESTING W REFLEX): HIV Screen 4th Generation wRfx: NONREACTIVE

## 2015-05-16 LAB — CYSTIC FIBROSIS MUTATION 97: Interpretation: NOT DETECTED

## 2015-05-16 LAB — ANTIBODY SCREEN: Antibody Screen: NEGATIVE

## 2015-05-16 LAB — HEPATITIS B SURFACE ANTIGEN: Hepatitis B Surface Ag: NEGATIVE

## 2015-05-16 LAB — ABO/RH: Rh Factor: POSITIVE

## 2015-05-16 LAB — RPR: RPR Ser Ql: NONREACTIVE

## 2015-05-16 LAB — VARICELLA ZOSTER ANTIBODY, IGG: Varicella zoster IgG: 226 index (ref >165)

## 2015-05-16 LAB — TSH: TSH: 1.32 u[IU]/mL (ref 0.450–4.500)

## 2015-05-25 ENCOUNTER — Ambulatory Visit (INDEPENDENT_AMBULATORY_CARE_PROVIDER_SITE_OTHER): Payer: Medicaid Other | Admitting: Advanced Practice Midwife

## 2015-05-25 ENCOUNTER — Encounter: Payer: Self-pay | Admitting: Advanced Practice Midwife

## 2015-05-25 ENCOUNTER — Telehealth: Payer: Self-pay | Admitting: *Deleted

## 2015-05-25 VITALS — BP 100/50 | HR 92 | Wt 173.0 lb

## 2015-05-25 DIAGNOSIS — Z331 Pregnant state, incidental: Secondary | ICD-10-CM

## 2015-05-25 DIAGNOSIS — Z1389 Encounter for screening for other disorder: Secondary | ICD-10-CM

## 2015-05-25 DIAGNOSIS — Z3682 Encounter for antenatal screening for nuchal translucency: Secondary | ICD-10-CM

## 2015-05-25 DIAGNOSIS — Z3482 Encounter for supervision of other normal pregnancy, second trimester: Secondary | ICD-10-CM

## 2015-05-25 DIAGNOSIS — Z0283 Encounter for blood-alcohol and blood-drug test: Secondary | ICD-10-CM

## 2015-05-25 DIAGNOSIS — Z3492 Encounter for supervision of normal pregnancy, unspecified, second trimester: Secondary | ICD-10-CM

## 2015-05-25 DIAGNOSIS — Z363 Encounter for antenatal screening for malformations: Secondary | ICD-10-CM

## 2015-05-25 LAB — POCT URINALYSIS DIPSTICK
Blood, UA: NEGATIVE
Glucose, UA: NEGATIVE
Leukocytes, UA: NEGATIVE
Nitrite, UA: NEGATIVE
Protein, UA: NEGATIVE

## 2015-05-25 NOTE — Telephone Encounter (Signed)
Pt informed letter is at front desk for her to take to a dentist.  Pt verbalized understanding.

## 2015-05-25 NOTE — Progress Notes (Signed)
[id number] Estimated Date of Delivery: 11/14/15  Last menstrual period 02/07/2015, unknown if currently breastfeeding.   BP weight and urine results all reviewed and noted.  Please refer to the obstetrical flow sheet for the fundal height and fetal heart rate documentation:  Patient reports good fetal movement, denies any bleeding and no rupture of membranes symptoms or regular contractions. Patient c/o hip pain that radiates down leg. Had to do PT last pregnancy.   All questions were answered.  Plan:  Continued routine obstetrical care, 2nd It. Info on kinesiology taping given   Follow up in 4 weeks for OB appointment, anatomy scan

## 2015-05-25 NOTE — Telephone Encounter (Signed)
Pt states she was here today and forgot to talk to Drenda FreezeFran about dental issues.  Pt states her wisdom teeth are impacted and have cavities and this morning a piece chipped off of one and it is very painful.  Pt want to know if it is safe to have surgery to have the teeth removed and also wants to know if this can be covered under her pregnancy medicaid due to "risk of the baby getting sepsis" because of her teeth being infected.  Please advise.

## 2015-05-25 NOTE — Patient Instructions (Addendum)
Safe Medications in Pregnancy   Acne: Benzoyl Peroxide Salicylic Acid  Backache/Headache: Tylenol: 2 regular strength every 4 hours OR              2 Extra strength every 6 hours  Colds/Coughs/Allergies: Benadryl (alcohol free) 25 mg every 6 hours as needed Breath right strips Claritin Cepacol throat lozenges Chloraseptic throat spray Cold-Eeze- up to three times per day Cough drops, alcohol free Flonase (by prescription only) Guaifenesin Mucinex Robitussin DM (plain only, alcohol free) Saline nasal spray/drops Sudafed (pseudoephedrine) & Actifed ** use only after [redacted] weeks gestation and if you do not have high blood pressure Tylenol Vicks Vaporub Zinc lozenges Zyrtec   Constipation: Colace Ducolax suppositories Fleet enema Glycerin suppositories Metamucil Milk of magnesia Miralax Senokot Smooth move tea  Diarrhea: Kaopectate Imodium A-D  *NO pepto Bismol  Hemorrhoids: Anusol Anusol HC Preparation H Tucks  Indigestion: Tums Maalox Mylanta Zantac  Pepcid  Insomnia: Benadryl (alcohol free)  every 6 hours as needed Tylenol PM Unisom, no Gelcaps  Leg Cramps: Tums MagGel  Nausea/Vomiting:  Bonine Dramamine Emetrol Ginger extract Sea bands Meclizine  Nausea medication to take during pregnancy:  Unisom (doxylamine succinate 25 mg tablets) Take one tablet daily at bedtime. If symptoms are not adequately controlled, the dose can be increased to a maximum recommended dose of two tablets daily (1/2 tablet in the morning, 1/2 tablet mid-afternoon and one at bedtime). Vitamin B6  tablets. Take one tablet twice a day (up to 200 mg per day).  Skin Rashes: Aveeno products Benadryl cream or  every 6 hours as needed Calamine Lotion 1% cortisone cream  Yeast infection: Gyne-lotrimin 7 Monistat 7   **If taking multiple medications, please check labels to avoid duplicating the same active ingredients **take medication as directed on  the label ** Do not exceed 4000 mg of tylenol in 24 hours **Do not take medications that contain aspirin or ibuprofen        Second Trimester of Pregnancy The second trimester is from week 13 through week 28, months 4 through 6. The second trimester is often a time when you feel your best. Your body has also adjusted to being pregnant, and you begin to feel better physically. Usually, morning sickness has lessened or quit completely, you may have more energy, and you may have an increase in appetite. The second trimester is also a time when the fetus is growing rapidly. At the end of the sixth month, the fetus is about 9 inches long and weighs about 1 pounds. You will likely begin to feel the baby move (quickening) between 18 and 20 weeks of the pregnancy. BODY CHANGES Your body goes through many changes during pregnancy. The changes vary from woman to woman.   Your weight will continue to increase. You will notice your lower abdomen bulging out.  You may begin to get stretch marks on your hips, abdomen, and breasts.  You may develop headaches that can be relieved by medicines approved by your health care provider.  You may urinate more often because the fetus is pressing on your bladder.  You may develop or continue to have heartburn as a result of your pregnancy.  You may develop constipation because certain hormones are causing the muscles that push waste through your intestines to slow down.  You may develop hemorrhoids or swollen, bulging veins (varicose veins).  You may have back pain because of the weight gain and pregnancy hormones relaxing your joints between the bones in your pelvis and  as a result of a shift in weight and the muscles that support your balance.  Your breasts will continue to grow and be tender.  Your gums may bleed and may be sensitive to brushing and flossing.  Dark spots or blotches (chloasma, mask of pregnancy) may develop on your face. This will  likely fade after the baby is born.  A dark line from your belly button to the pubic area (linea nigra) may appear. This will likely fade after the baby is born.  You may have changes in your hair. These can include thickening of your hair, rapid growth, and changes in texture. Some women also have hair loss during or after pregnancy, or hair that feels dry or thin. Your hair will most likely return to normal after your baby is born. WHAT TO EXPECT AT YOUR PRENATAL VISITS During a routine prenatal visit:  You will be weighed to make sure you and the fetus are growing normally.  Your blood pressure will be taken.  Your abdomen will be measured to track your baby's growth.  The fetal heartbeat will be listened to.  Any test results from the previous visit will be discussed. Your health care provider may ask you:  How you are feeling.  If you are feeling the baby move.  If you have had any abnormal symptoms, such as leaking fluid, bleeding, severe headaches, or abdominal cramping.  If you have any questions. Other tests that may be performed during your second trimester include:  Blood tests that check for:  Low iron levels (anemia).  Gestational diabetes (between 24 and 28 weeks).  Rh antibodies.  Urine tests to check for infections, diabetes, or protein in the urine.  An ultrasound to confirm the proper growth and development of the baby.  An amniocentesis to check for possible genetic problems.  Fetal screens for spina bifida and Down syndrome. HOME CARE INSTRUCTIONS   Avoid all smoking, herbs, alcohol, and unprescribed drugs. These chemicals affect the formation and growth of the baby.  Follow your health care provider's instructions regarding medicine use. There are medicines that are either safe or unsafe to take during pregnancy.  Exercise only as directed by your health care provider. Experiencing uterine cramps is a good sign to stop exercising.  Continue to  eat regular, healthy meals.  Wear a good support bra for breast tenderness.  Do not use hot tubs, steam rooms, or saunas.  Wear your seat belt at all times when driving.  Avoid raw meat, uncooked cheese, cat litter boxes, and soil used by cats. These carry germs that can cause birth defects in the baby.  Take your prenatal vitamins.  Try taking a stool softener (if your health care provider approves) if you develop constipation. Eat more high-fiber foods, such as fresh vegetables or fruit and whole grains. Drink plenty of fluids to keep your urine clear or pale yellow.  Take warm sitz baths to soothe any pain or discomfort caused by hemorrhoids. Use hemorrhoid cream if your health care provider approves.  If you develop varicose veins, wear support hose. Elevate your feet for 15 minutes, 3-4 times a day. Limit salt in your diet.  Avoid heavy lifting, wear low heel shoes, and practice good posture.  Rest with your legs elevated if you have leg cramps or low back pain.  Visit your dentist if you have not gone yet during your pregnancy. Use a soft toothbrush to brush your teeth and be gentle when you floss.  A  sexual relationship may be continued unless your health care provider directs you otherwise.  Continue to go to all your prenatal visits as directed by your health care provider. SEEK MEDICAL CARE IF:   You have dizziness.  You have mild pelvic cramps, pelvic pressure, or nagging pain in the abdominal area.  You have persistent nausea, vomiting, or diarrhea.  You have a bad smelling vaginal discharge.  You have pain with urination. SEEK IMMEDIATE MEDICAL CARE IF:   You have a fever.  You are leaking fluid from your vagina.  You have spotting or bleeding from your vagina.  You have severe abdominal cramping or pain.  You have rapid weight gain or loss.  You have shortness of breath with chest pain.  You notice sudden or extreme swelling of your face, hands,  ankles, feet, or legs.  You have not felt your baby move in over an hour.  You have severe headaches that do not go away with medicine.  You have vision changes. Document Released: 10/16/2001 Document Revised: 10/27/2013 Document Reviewed: 12/23/2012 [facility]Exit[facility] Patient Information 2015 [place], [place]. This information is not intended to replace advice given to you by your health care provider. Make sure you discuss any questions you have with your health care provider.   Kinesiology taping for pregnancy

## 2015-05-25 NOTE — Telephone Encounter (Signed)
She can certainly have any surgery dentist deems necessary--she can pick up a form at front desk to give to dentist. I cannot speak to how medicaid will handle it.  She will have to call medicaid and ask them. Drenda FreezeFran

## 2015-05-27 ENCOUNTER — Telehealth: Payer: Self-pay | Admitting: *Deleted

## 2015-05-27 LAB — PMP SCREEN PROFILE (10S), URINE
Amphetamine Screen, Ur: NEGATIVE ng/mL
Barbiturate Screen, Ur: NEGATIVE ng/mL
Benzodiazepine Screen, Urine: NEGATIVE ng/mL
Cannabinoids Ur Ql Scn: NEGATIVE ng/mL
Cocaine(Metab.)Screen, Urine: NEGATIVE ng/mL
Creatinine(Crt), U: 160.1 mg/dL (ref 20.0–300.0)
Methadone Scn, Ur: NEGATIVE ng/mL
Opiate Scrn, Ur: NEGATIVE ng/mL
Oxycodone+Oxymorphone Ur Ql Scn: NEGATIVE ng/mL
PCP Scrn, Ur: NEGATIVE ng/mL
Ph of Urine: 6.7 (ref 4.5–8.9)
Propoxyphene, Screen: NEGATIVE ng/mL

## 2015-05-27 LAB — MATERNAL SCREEN, INTEGRATED #2
AFP MoM: 0.94
Alpha-Fetoprotein: 23.9 ng/mL
Crown Rump Length: 46.9 mm
DIA MoM: 0.93
DIA Value: 155.7 pg/mL
Estriol, Unconjugated: 0.52 ng/mL
Gest. Age on Collection Date: 11.4 weeks
Gestational Age: 15.4 weeks
Maternal Age at EDD: 26.1 years
Nuchal Translucency (NT): 0.8 mm
Nuchal Translucency MoM: 0.78
Number of Fetuses: 1
PAPP-A MoM: 0.42
PAPP-A Value: 224.4 ng/mL
Test Results:: NEGATIVE
Weight: 173 [lb_av]
Weight: 173 [lb_av]
hCG MoM: 0.4
hCG Value: 15.4 IU/mL
uE3 MoM: 0.72

## 2015-05-27 NOTE — Telephone Encounter (Signed)
Pt called stating that she started having a pain in her right breast last night. Pt states that the pain starts a the center of her chest and radiates through her breast into her nipple. Pt states that it is a sharp pain. Pt denies any redness or discharge. I spoke with Cyril Mourning and she advised for the pt to wear a good bra and decrease her caffeine and call us back if it continues. I advised the pt of this and she said that she has already done all of that. I offered the pt an appointment for the first of next week and she stated that she wouldn't be able to do that due to her work schedule. The pt then stated that she would watch it over the weekend and call us back first thing Monday morning if things are not better.

## 2015-05-30 ENCOUNTER — Telehealth: Payer: Self-pay | Admitting: *Deleted

## 2015-05-30 ENCOUNTER — Encounter: Payer: Self-pay | Admitting: Women's Health

## 2015-05-30 ENCOUNTER — Ambulatory Visit (INDEPENDENT_AMBULATORY_CARE_PROVIDER_SITE_OTHER): Payer: Medicaid Other | Admitting: Women's Health

## 2015-05-30 VITALS — BP 98/58 | HR 96 | Wt 176.0 lb

## 2015-05-30 DIAGNOSIS — Z331 Pregnant state, incidental: Secondary | ICD-10-CM

## 2015-05-30 DIAGNOSIS — Z3482 Encounter for supervision of other normal pregnancy, second trimester: Secondary | ICD-10-CM

## 2015-05-30 DIAGNOSIS — Z130 Encounter for screening for diseases of the blood and blood-forming organs and certain disorders involving the immune mechanism: Secondary | ICD-10-CM

## 2015-05-30 DIAGNOSIS — Z1389 Encounter for screening for other disorder: Secondary | ICD-10-CM

## 2015-05-30 DIAGNOSIS — R42 Dizziness and giddiness: Secondary | ICD-10-CM

## 2015-05-30 DIAGNOSIS — Z3492 Encounter for supervision of normal pregnancy, unspecified, second trimester: Secondary | ICD-10-CM

## 2015-05-30 LAB — POCT URINALYSIS DIPSTICK
Blood, UA: NEGATIVE
Glucose, UA: NEGATIVE
Leukocytes, UA: NEGATIVE
Nitrite, UA: NEGATIVE
Protein, UA: NEGATIVE

## 2015-05-30 LAB — POCT HEMOGLOBIN: Hemoglobin: 13 g/dL (ref 12.2–16.2)

## 2015-05-30 NOTE — Patient Instructions (Signed)
For Dizzy Spells:  °This is usually related to either your blood sugar or your blood pressure dropping °Make sure you are staying well hydrated and drinking enough water so that your urine is clear °Eat small frequent meals and snacks containing protein (meat, eggs, nuts, cheese) so that your blood sugar doesn't drop °If you do get dizzy, sit/lay down and get you something to drink and a snack containing protein- you will usually start feeling better in 10-20 minutes °  °

## 2015-05-30 NOTE — Telephone Encounter (Signed)
Pt called stating that she has been having some light headedness and dizzy spells. Pt stated that she thought she had had some trouble breathing and people at work said that she was pale. I advised the pt she would need to be seen, call was switched to front office and appointment for today was given.

## 2015-05-30 NOTE — Progress Notes (Signed)
Work-in Low-risk OB appointment [id number] [redacted]w[redacted]d Estimated Date of Delivery: 11/14/15 LMP 02/07/2015 (Exact Date)  BP, weight, and urine reviewed.  Refer to obstetrical flow sheet for FH & FHR.  Some fm. Denies cramping, lof, vb, or uti s/s. Light-headed, dizzy, clammy, pale pretty much every morning about after eating breakfast. Has tried multiple different types of food- but eats mostly carbs. Gets to eat q 2hrs at work. Sounds like hypoglycemia events. To increase protein and po fluids. Also w/ 1+ketonuria. Hgb fingerstick today: 13.0.  Reviewed warning s/s to report. Plan:  Continue routine obstetrical care  F/U as scheduled for OB appointment

## 2015-06-23 ENCOUNTER — Ambulatory Visit (INDEPENDENT_AMBULATORY_CARE_PROVIDER_SITE_OTHER): Payer: BLUE CROSS/BLUE SHIELD

## 2015-06-23 ENCOUNTER — Ambulatory Visit (INDEPENDENT_AMBULATORY_CARE_PROVIDER_SITE_OTHER): Payer: BLUE CROSS/BLUE SHIELD | Admitting: Advanced Practice Midwife

## 2015-06-23 ENCOUNTER — Encounter: Payer: Self-pay | Admitting: Advanced Practice Midwife

## 2015-06-23 VITALS — BP 102/58 | HR 84 | Wt 175.0 lb

## 2015-06-23 DIAGNOSIS — Z3492 Encounter for supervision of normal pregnancy, unspecified, second trimester: Secondary | ICD-10-CM

## 2015-06-23 DIAGNOSIS — Z3482 Encounter for supervision of other normal pregnancy, second trimester: Secondary | ICD-10-CM

## 2015-06-23 DIAGNOSIS — Z1389 Encounter for screening for other disorder: Secondary | ICD-10-CM

## 2015-06-23 DIAGNOSIS — Z331 Pregnant state, incidental: Secondary | ICD-10-CM

## 2015-06-23 DIAGNOSIS — Z36 Encounter for antenatal screening of mother: Secondary | ICD-10-CM | POA: Diagnosis not present

## 2015-06-23 DIAGNOSIS — Z363 Encounter for antenatal screening for malformations: Secondary | ICD-10-CM

## 2015-06-23 LAB — POCT URINALYSIS DIPSTICK
Blood, UA: NEGATIVE
Glucose, UA: NEGATIVE
Ketones, UA: NEGATIVE
Leukocytes, UA: NEGATIVE
Nitrite, UA: NEGATIVE
Protein, UA: NEGATIVE

## 2015-06-23 NOTE — Progress Notes (Signed)
[id number] [redacted]w[redacted]d Estimated Date of Delivery: 11/14/15  Blood pressure 102/58, pulse 84, weight 175 lb (79.379 kg), last menstrual period 02/07/2015, unknown if currently breastfeeding.   BP weight and urine results all reviewed and noted.  Please refer to the obstetrical flow sheet for the fundal height and fetal heart rate documentation:US 19+3wks,breech,measurements c/w dates,ant pl, cx 4.8cm,normal ov's bilat,efw 292g,svp of fluid 4.1cm,limited view of spine because of fetal pos.,please have pt come back for additional images,no obvious abn seen  Patient reports good fetal movement, denies any bleeding and no rupture of membranes symptoms or regular contractions. Patient is without complaints. Eats "all the time", just a lot more healthy All questions were answered.  Plan:  Continued routine obstetrical care,   Follow up in 4 weeks for OB appointment,

## 2015-06-23 NOTE — Patient Instructions (Signed)
Second Trimester of Pregnancy The second trimester is from week 13 through week 28, months 4 through 6. The second trimester is often a time when you feel your best. Your body has also adjusted to being pregnant, and you begin to feel better physically. Usually, morning sickness has lessened or quit completely, you may have more energy, and you may have an increase in appetite. The second trimester is also a time when the fetus is growing rapidly. At the end of the sixth month, the fetus is about 9 inches long and weighs about 1 pounds. You will likely begin to feel the baby move (quickening) between 18 and 20 weeks of the pregnancy. BODY CHANGES Your body goes through many changes during pregnancy. The changes vary from woman to woman.   Your weight will continue to increase. You will notice your lower abdomen bulging out.  You may begin to get stretch marks on your hips, abdomen, and breasts.  You may develop headaches that can be relieved by medicines approved by your health care provider.  You may urinate more often because the fetus is pressing on your bladder.  You may develop or continue to have heartburn as a result of your pregnancy.  You may develop constipation because certain hormones are causing the muscles that push waste through your intestines to slow down.  You may develop hemorrhoids or swollen, bulging veins (varicose veins).  You may have back pain because of the weight gain and pregnancy hormones relaxing your joints between the bones in your pelvis and as a result of a shift in weight and the muscles that support your balance.  Your breasts will continue to grow and be tender.  Your gums may bleed and may be sensitive to brushing and flossing.  Dark spots or blotches (chloasma, mask of pregnancy) may develop on your face. This will likely fade after the baby is born.  A dark line from your belly button to the pubic area (linea nigra) may appear. This will likely fade  after the baby is born.  You may have changes in your hair. These can include thickening of your hair, rapid growth, and changes in texture. Some women also have hair loss during or after pregnancy, or hair that feels dry or thin. Your hair will most likely return to normal after your baby is born. WHAT TO EXPECT AT YOUR PRENATAL VISITS During a routine prenatal visit:  You will be weighed to make sure you and the fetus are growing normally.  Your blood pressure will be taken.  Your abdomen will be measured to track your baby's growth.  The fetal heartbeat will be listened to.  Any test results from the previous visit will be discussed. Your health care provider may ask you:  How you are feeling.  If you are feeling the baby move.  If you have had any abnormal symptoms, such as leaking fluid, bleeding, severe headaches, or abdominal cramping.  If you have any questions. Other tests that may be performed during your second trimester include:  Blood tests that check for:  Low iron levels (anemia).  Gestational diabetes (between 24 and 28 weeks).  Rh antibodies.  Urine tests to check for infections, diabetes, or protein in the urine.  An ultrasound to confirm the proper growth and development of the baby.  An amniocentesis to check for possible genetic problems.  Fetal screens for spina bifida and Down syndrome. HOME CARE INSTRUCTIONS   Avoid all smoking, herbs, alcohol, and unprescribed   drugs. These chemicals affect the formation and growth of the baby.  Follow your health care provider's instructions regarding medicine use. There are medicines that are either safe or unsafe to take during pregnancy.  Exercise only as directed by your health care provider. Experiencing uterine cramps is a good sign to stop exercising.  Continue to eat regular, healthy meals.  Wear a good support bra for breast tenderness.  Do not use hot tubs, steam rooms, or saunas.  Wear your  seat belt at all times when driving.  Avoid raw meat, uncooked cheese, cat litter boxes, and soil used by cats. These carry germs that can cause birth defects in the baby.  Take your prenatal vitamins.  Try taking a stool softener (if your health care provider approves) if you develop constipation. Eat more high-fiber foods, such as fresh vegetables or fruit and whole grains. Drink plenty of fluids to keep your urine clear or pale yellow.  Take warm sitz baths to soothe any pain or discomfort caused by hemorrhoids. Use hemorrhoid cream if your health care provider approves.  If you develop varicose veins, wear support hose. Elevate your feet for 15 minutes, 3-4 times a day. Limit salt in your diet.  Avoid heavy lifting, wear low heel shoes, and practice good posture.  Rest with your legs elevated if you have leg cramps or low back pain.  Visit your dentist if you have not gone yet during your pregnancy. Use a soft toothbrush to brush your teeth and be gentle when you floss.  A sexual relationship may be continued unless your health care provider directs you otherwise.  Continue to go to all your prenatal visits as directed by your health care provider. SEEK MEDICAL CARE IF:   You have dizziness.  You have mild pelvic cramps, pelvic pressure, or nagging pain in the abdominal area.  You have persistent nausea, vomiting, or diarrhea.  You have a bad smelling vaginal discharge.  You have pain with urination. SEEK IMMEDIATE MEDICAL CARE IF:   You have a fever.  You are leaking fluid from your vagina.  You have spotting or bleeding from your vagina.  You have severe abdominal cramping or pain.  You have rapid weight gain or loss.  You have shortness of breath with chest pain.  You notice sudden or extreme swelling of your face, hands, ankles, feet, or legs.  You have not felt your baby move in over an hour.  You have severe headaches that do not go away with  medicine.  You have vision changes. Document Released: 10/16/2001 Document Revised: 10/27/2013 Document Reviewed: 12/23/2012 [employer] Patient Information 2015 [employer]. This information is not intended to replace advice given to you by your health care provider. Make sure you discuss any questions you have with your health care provider.  

## 2015-06-23 NOTE — Progress Notes (Signed)
[place] 19+3wks,breech,measurements c/w dates,ant pl, cx 4.8cm,normal ov's bilat,efw 292g,svp of fluid 4.1cm,limited view of spine because of fetal pos.,please have pt come back for additional images,no obvious abn seen

## 2015-07-21 ENCOUNTER — Encounter: Payer: Self-pay | Admitting: Advanced Practice Midwife

## 2015-07-21 ENCOUNTER — Ambulatory Visit (INDEPENDENT_AMBULATORY_CARE_PROVIDER_SITE_OTHER): Payer: BLUE CROSS/BLUE SHIELD | Admitting: Advanced Practice Midwife

## 2015-07-21 VITALS — BP 104/60 | HR 88 | Wt 178.0 lb

## 2015-07-21 DIAGNOSIS — Z3492 Encounter for supervision of normal pregnancy, unspecified, second trimester: Secondary | ICD-10-CM

## 2015-07-21 DIAGNOSIS — Z363 Encounter for antenatal screening for malformations: Secondary | ICD-10-CM

## 2015-07-21 DIAGNOSIS — Z331 Pregnant state, incidental: Secondary | ICD-10-CM

## 2015-07-21 DIAGNOSIS — Z3482 Encounter for supervision of other normal pregnancy, second trimester: Secondary | ICD-10-CM

## 2015-07-21 DIAGNOSIS — Z1389 Encounter for screening for other disorder: Secondary | ICD-10-CM

## 2015-07-21 LAB — POCT URINALYSIS DIPSTICK
Blood, UA: NEGATIVE
Glucose, UA: NEGATIVE
Ketones, UA: NEGATIVE
Leukocytes, UA: NEGATIVE
Nitrite, UA: NEGATIVE
Protein, UA: NEGATIVE

## 2015-07-21 NOTE — Progress Notes (Signed)
Pt states that she has spotting here and there, and she has some pain in her vagina. Pt states that she has noticed some bleeding at times after she has a BM as well.

## 2015-07-21 NOTE — Patient Instructions (Signed)

## 2015-07-21 NOTE — Progress Notes (Signed)
[id number] [redacted]w[redacted]d Estimated Date of Delivery: 11/14/15  Blood pressure 104/60, pulse 88, weight 178 lb (80.74 kg), last menstrual period 02/07/2015, unknown if currently breastfeeding.   BP weight and urine results all reviewed and noted.  Please refer to the obstetrical flow sheet for the fundal height and fetal heart rate documentation:  Patient reports good fetal movement, denies any bleeding and no rupture of membranes symptoms or regular contractions. Patient has rectal bleeding on occ with firm BMs.  Says vaginal walls are loose.  No external hemorrhoids. Smal amount decensus with valsalva, but nothing too worrisome All questions were answered.  Orders Placed This Encounter  Procedures  . US OB Follow Up  . POCT urinalysis dipstick    Plan:  Continued routine obstetrical care, 10 sets of Kegals 10X/day. Stool softner prn  Return in about 3 weeks (around 08/11/2015) for PN2/LROB, US:OB F/U:.

## 2015-08-10 ENCOUNTER — Other Ambulatory Visit: Payer: Self-pay | Admitting: Advanced Practice Midwife

## 2015-08-10 DIAGNOSIS — IMO0002 Reserved for concepts with insufficient information to code with codable children: Secondary | ICD-10-CM

## 2015-08-10 DIAGNOSIS — Z0489 Encounter for examination and observation for other specified reasons: Secondary | ICD-10-CM

## 2015-08-11 ENCOUNTER — Encounter: Payer: Self-pay | Admitting: Obstetrics & Gynecology

## 2015-08-11 ENCOUNTER — Ambulatory Visit (INDEPENDENT_AMBULATORY_CARE_PROVIDER_SITE_OTHER): Payer: BLUE CROSS/BLUE SHIELD

## 2015-08-11 ENCOUNTER — Ambulatory Visit (INDEPENDENT_AMBULATORY_CARE_PROVIDER_SITE_OTHER): Payer: BLUE CROSS/BLUE SHIELD | Admitting: Obstetrics & Gynecology

## 2015-08-11 ENCOUNTER — Other Ambulatory Visit: Payer: BLUE CROSS/BLUE SHIELD

## 2015-08-11 VITALS — BP 118/60 | HR 96 | Wt 179.5 lb

## 2015-08-11 DIAGNOSIS — Z1389 Encounter for screening for other disorder: Secondary | ICD-10-CM

## 2015-08-11 DIAGNOSIS — Z0489 Encounter for examination and observation for other specified reasons: Secondary | ICD-10-CM

## 2015-08-11 DIAGNOSIS — Z36 Encounter for antenatal screening of mother: Secondary | ICD-10-CM | POA: Diagnosis not present

## 2015-08-11 DIAGNOSIS — Z3482 Encounter for supervision of other normal pregnancy, second trimester: Secondary | ICD-10-CM

## 2015-08-11 DIAGNOSIS — Z369 Encounter for antenatal screening, unspecified: Secondary | ICD-10-CM

## 2015-08-11 DIAGNOSIS — Z331 Pregnant state, incidental: Secondary | ICD-10-CM

## 2015-08-11 DIAGNOSIS — Z3492 Encounter for supervision of normal pregnancy, unspecified, second trimester: Secondary | ICD-10-CM

## 2015-08-11 DIAGNOSIS — Z131 Encounter for screening for diabetes mellitus: Secondary | ICD-10-CM

## 2015-08-11 DIAGNOSIS — IMO0002 Reserved for concepts with insufficient information to code with codable children: Secondary | ICD-10-CM

## 2015-08-11 LAB — POCT URINALYSIS DIPSTICK
Blood, UA: NEGATIVE
Glucose, UA: NEGATIVE
Nitrite, UA: NEGATIVE
Protein, UA: NEGATIVE

## 2015-08-11 NOTE — Progress Notes (Addendum)
[place] 26+3wks,measurements c/w dates,efw 922g 48%,breech,ant pl gr 0,normal ov's bilat,svp of fluid 5.4cm,anatomy of the spine complete,no obvious abn seen

## 2015-08-11 NOTE — Progress Notes (Signed)
[id number] [redacted]w[redacted]d Estimated Date of Delivery: 11/14/15  Blood pressure 118/60, pulse 96, weight 179 lb 8 oz (81.421 kg), last menstrual period 02/07/2015, unknown if currently breastfeeding.   BP weight and urine results all reviewed and noted.  Please refer to the obstetrical flow sheet for the fundal height and fetal heart rate documentation:  Patient reports good fetal movement, denies any bleeding and no rupture of membranes symptoms or regular contractions. Patient is without complaints. All questions were answered.  Orders Placed This Encounter  Procedures  . POCT Urinalysis Dipstick    Plan:  Continued routine obstetrical care,   No Follow-up on file.

## 2015-08-12 LAB — GLUCOSE TOLERANCE, 2 HOURS W/ 1HR
Glucose, 1 hour: 113 mg/dL (ref 65–179)
Glucose, 2 hour: 92 mg/dL (ref 65–152)
Glucose, Fasting: 70 mg/dL (ref 65–91)

## 2015-08-12 LAB — CBC
Hematocrit: 35.9 % (ref 34.0–46.6)
Hemoglobin: 12.5 g/dL (ref 11.1–15.9)
MCH: 30.6 pg (ref 26.6–33.0)
MCHC: 34.8 g/dL (ref 31.5–35.7)
MCV: 88 fL (ref 79–97)
Platelets: 197 10*3/uL (ref 150–379)
RBC: 4.08 x10E6/uL (ref 3.77–5.28)
RDW: 13.3 % (ref 12.3–15.4)
WBC: 7.7 10*3/uL (ref 3.4–10.8)

## 2015-08-12 LAB — ANTIBODY SCREEN: Antibody Screen: NEGATIVE

## 2015-08-12 LAB — RPR: RPR Ser Ql: NONREACTIVE

## 2015-08-12 LAB — HIV ANTIBODY (ROUTINE TESTING W REFLEX): HIV Screen 4th Generation wRfx: NONREACTIVE

## 2015-08-21 ENCOUNTER — Encounter (HOSPITAL_COMMUNITY): Payer: Self-pay | Admitting: *Deleted

## 2015-08-21 ENCOUNTER — Inpatient Hospital Stay (HOSPITAL_COMMUNITY)
Admission: AD | Admit: 2015-08-21 | Discharge: 2015-08-21 | Disposition: A | Discharge status: Home / Self Care | Payer: BLUE CROSS/BLUE SHIELD | Source: Ambulatory Visit | Attending: Obstetrics & Gynecology | Admitting: Obstetrics & Gynecology

## 2015-08-21 DIAGNOSIS — Z3A27 27 weeks gestation of pregnancy: Secondary | ICD-10-CM | POA: Insufficient documentation

## 2015-08-21 DIAGNOSIS — O4703 False labor before 37 completed weeks of gestation, third trimester: Secondary | ICD-10-CM | POA: Diagnosis not present

## 2015-08-21 DIAGNOSIS — Z79899 Other long term (current) drug therapy: Secondary | ICD-10-CM | POA: Insufficient documentation

## 2015-08-21 DIAGNOSIS — R109 Unspecified abdominal pain: Secondary | ICD-10-CM | POA: Diagnosis present

## 2015-08-21 DIAGNOSIS — Z87891 Personal history of nicotine dependence: Secondary | ICD-10-CM | POA: Diagnosis not present

## 2015-08-21 DIAGNOSIS — O4702 False labor before 37 completed weeks of gestation, second trimester: Secondary | ICD-10-CM | POA: Insufficient documentation

## 2015-08-21 LAB — URINALYSIS, ROUTINE W REFLEX MICROSCOPIC
Bilirubin Urine: NEGATIVE
Glucose, UA: NEGATIVE mg/dL
Hgb urine dipstick: NEGATIVE
Ketones, ur: NEGATIVE mg/dL
Leukocytes, UA: NEGATIVE
Nitrite: NEGATIVE
Protein, ur: NEGATIVE mg/dL
Specific Gravity, Urine: 1.01 (ref 1.005–1.030)
Urobilinogen, UA: 0.2 mg/dL (ref 0.0–1.0)
pH: 7 (ref 5.0–8.0)

## 2015-08-21 NOTE — Discharge Instructions (Signed)
Preterm Birth °Preterm birth is a birth that happens before 37 weeks of pregnancy. Most pregnancies last about 39-41 weeks. Every week in the womb is important and is beneficial to the health of the infant. Infants born before 37 weeks of pregnancy are at a higher risk for complications. Depending on when the infant was born, he or she may be: °· Late preterm. Born between 32 weeks and 37 weeks of pregnancy. °· Very preterm. Born at less than 32 weeks of pregnancy. °· Extremely preterm. Born at less than 25 weeks of pregnancy. °The earlier a baby is born, the more likely the child will have issues related to prematurity. Complications and problems that can be seen in infants born too early include: °· Problems breathing (respiratory distress syndrome). °· Low birth weight. °· Problems feeding. °· Sleeping problems. °· Yellowing of the skin (jaundice). °· Infections such as pneumonia.  °Babies born very preterm or extremely preterm are at risk for more serious medical issues. These include: °· More severe breathing issues. °· Eyesight issues. °· Brain development issues (intraventricular hemorrhage). °· Behavioral and emotional development issues. °· Growth and developmental delays. °· Cerebral palsy. °· Serious feeding or bowel complications (necrotizing enterocolitis). °CAUSES  °There are two broad categories of preterm birth. °· Spontaneous preterm birth. This is a birth resulting from preterm labor (not medically induced) or preterm premature rupture of membranes (PPROM). °· Indicated preterm birth. This is a birth resulting from labor being medically induced due to health, personal, or social reasons. °RISK FACTORS °Preterm birth may be related to certain medical conditions, lifestyle factors, or demographic factors encountered by the mother or fetus. °· Medical conditions include: °¨ Multiple gestations (twins, triplets, and so on). °¨ Infection. °¨ Diabetes. °¨ Heart disease. °¨ Kidney disease. °¨ Cervical or  uterine abnormalities. °¨ Being underweight. °¨ High blood pressure or preeclampsia. °¨ Premature rupture of membranes (PROM). °¨ Birth defects in the fetus. °· Lifestyle factors include: °¨ Poor prenatal care. °¨ Poor nutrition or anemia. °¨ Cigarette smoking. °¨ Consuming alcohol. °¨ High levels of stress and lack of social or emotional support. °¨ Exposure to chemical or environmental toxins. °¨ Substance abuse. °· Demographic factors include: °¨ African-American ethnicity. °¨ Age (younger than 18 or older than 26 years of age). °¨ Low socioeconomic status. °Women with a history of preterm labor or who become pregnant within 18 months of giving birth are also at increased risk for preterm birth. °DIAGNOSIS  °Your health care provider may request additional tests to diagnose underlying complications resulting from preterm birth. Tests on the infant may include: °· Physical exam. °· Blood tests. °· Chest X-rays. °· Heart-lung monitoring. °TREATMENT  °After birth, special care will be taken to assess any problems or complications for the infant. Supportive care will be provided for the infant. Treatment depends on what problems are present and any complications that develop. Some preterm infants are cared for in a neonatal intensive care unit. In general, care may include: °· Maintaining temperature and oxygen in a clear heated box (baby isolette). °· Monitoring the infant's heart rate, breathing, and level of oxygen in the blood. °· Monitoring for signs of infection and, if needed, giving IV antibiotic medicine. °· Inserting a feeding tube (nose, mouth) or giving IV nutrition if unable to feed. °· Inserting a breathing tube (ventilation). °· Respiration support (continuous positive airway pressure [CPAP] or oxygen).  °Treatment will change as the infant builds up strength and is able to breathe and eat on his or her   own. For some infants, no special treatment is necessary. Parents may be educated on the potential  health risks of prematurity to the infant. HOME CARE INSTRUCTIONS  Understand your infant's special conditions and needs. It may be reassuring to learn about infant CPR.  Monitor your infant in the car seat until he or she grows and matures. Infant car seats can cause breathing difficulties for preterm infants.  Keep your infant warm. Dress your infant in layers and keep him or her away from drafts, especially in cold months of the year.  Wash your hands thoroughly after going to the bathroom or changing a diaper. Late preterm infants may be more prone to infection.  Follow all your health care provider's instructions for providing support and care to your preterm infant.  Get support from organizations and groups that understand your challenges.  Follow up with your infant's health care provider as directed. Prevention There are some things you can do to help lower your risk of having a preterm infant in the future. These include:  Good prenatal care throughout the entire pregnancy. See a health care provider regularly for advice and tests.  Management of underlying medical conditions.  Proper self-care and lifestyle changes.  Proper diet and weight control.  Watching for signs of various infections. SEEK MEDICAL CARE IF:  Your infant has feeding difficulties.  Your infant has sleeping difficulties.  Your infant has breathing difficulties.  Your infant's skin starts to look yellow.  Your infant shows signs of infection, such as a stuffy nose, fever, crying, or bluish color of the skin. FOR MORE INFORMATION [employer]: www.marchofdimes.com Prematurity.org: www.prematurity.org   This information is not intended to replace advice given to you by your health care provider. Make sure you discuss any questions you have with your health care provider.   Document Released: 01/12/2004 Document Revised: 08/12/2013 Document Reviewed: 05/21/2013 Elsevier Interactive Patient  Education 2016 [employer] Fetal Movement Counts Patient Name: __________________________________________________ Patient Due Date: ____________________ Performing a fetal movement count is highly recommended in high-risk pregnancies, but it is good for every pregnant woman to do. Your health care provider may ask you to start counting fetal movements at 28 weeks of the pregnancy. Fetal movements often increase:  After eating a full meal.  After physical activity.  After eating or drinking something sweet or cold.  At rest. Pay attention to when you feel the baby is most active. This will help you notice a pattern of your baby's sleep and wake cycles and what factors contribute to an increase in fetal movement. It is important to perform a fetal movement count at the same time each day when your baby is normally most active.  HOW TO COUNT FETAL MOVEMENTS  Find a quiet and comfortable area to sit or lie down on your left side. Lying on your left side provides the best blood and oxygen circulation to your baby.  Write down the day and time on a sheet of paper or in a journal.  Start counting kicks, flutters, swishes, rolls, or jabs in a 2-hour period. You should feel at least 10 movements within 2 hours.  If you do not feel 10 movements in 2 hours, wait 2-3 hours and count again. Look for a change in the pattern or not enough counts in 2 hours. SEEK MEDICAL CARE IF:  You feel less than 10 counts in 2 hours, tried twice.  There is no movement in over an hour.  The pattern is changing  or taking longer each day to reach 10 counts in 2 hours.  You feel the baby is not moving as he or she usually does. Date: ____________ Movements: ____________ Start time: ____________ Katie Briggs time: ____________  Date: ____________ Movements: ____________ Start time: ____________ Katie Briggs time: ____________ Date: ____________ Movements: ____________ Start time: ____________ Katie Briggs time: ____________ Date:  ____________ Movements: ____________ Start time: ____________ Katie Briggs time: ____________ Date: ____________ Movements: ____________ Start time: ____________ Katie Briggs time: ____________ Date: ____________ Movements: ____________ Start time: ____________ Katie Briggs time: ____________ Date: ____________ Movements: ____________ Start time: ____________ Katie Briggs time: ____________ Date: ____________ Movements: ____________ Start time: ____________ Katie Briggs time: ____________  Date: ____________ Movements: ____________ Start time: ____________ Katie Briggs time: ____________ Date: ____________ Movements: ____________ Start time: ____________ Katie Briggs time: ____________ Date: ____________ Movements: ____________ Start time: ____________ Katie Briggs time: ____________ Date: ____________ Movements: ____________ Start time: ____________ Katie Briggs time: ____________ Date: ____________ Movements: ____________ Start time: ____________ Katie Briggs time: ____________ Date: ____________ Movements: ____________ Start time: ____________ Katie Briggs time: ____________ Date: ____________ Movements: ____________ Start time: ____________ Katie Briggs time: ____________  Date: ____________ Movements: ____________ Start time: ____________ Katie Briggs time: ____________ Date: ____________ Movements: ____________ Start time: ____________ Katie Briggs time: ____________ Date: ____________ Movements: ____________ Start time: ____________ Katie Briggs time: ____________ Date: ____________ Movements: ____________ Start time: ____________ Katie Briggs time: ____________ Date: ____________ Movements: ____________ Start time: ____________ Katie Briggs time: ____________ Date: ____________ Movements: ____________ Start time: ____________ Katie Briggs time: ____________ Date: ____________ Movements: ____________ Start time: ____________ Katie Briggs time: ____________  Date: ____________ Movements: ____________ Start time: ____________ Katie Briggs time: ____________ Date: ____________ Movements: ____________ Start  time: ____________ Katie Briggs time: ____________ Date: ____________ Movements: ____________ Start time: ____________ Katie Briggs time: ____________ Date: ____________ Movements: ____________ Start time: ____________ Katie Briggs time: ____________ Date: ____________ Movements: ____________ Start time: ____________ Katie Briggs time: ____________ Date: ____________ Movements: ____________ Start time: ____________ Katie Briggs time: ____________ Date: ____________ Movements: ____________ Start time: ____________ Katie Briggs time: ____________  Date: ____________ Movements: ____________ Start time: ____________ Katie Briggs time: ____________ Date: ____________ Movements: ____________ Start time: ____________ Katie Briggs time: ____________ Date: ____________ Movements: ____________ Start time: ____________ Katie Briggs time: ____________ Date: ____________ Movements: ____________ Start time: ____________ Katie Briggs time: ____________ Date: ____________ Movements: ____________ Start time: ____________ Katie Briggs time: ____________ Date: ____________ Movements: ____________ Start time: ____________ Katie Briggs time: ____________ Date: ____________ Movements: ____________ Start time: ____________ Katie Briggs time: ____________  Date: ____________ Movements: ____________ Start time: ____________ Katie Briggs time: ____________ Date: ____________ Movements: ____________ Start time: ____________ Katie Briggs time: ____________ Date: ____________ Movements: ____________ Start time: ____________ Katie Briggs time: ____________ Date: ____________ Movements: ____________ Start time: ____________ Katie Briggs time: ____________ Date: ____________ Movements: ____________ Start time: ____________ Katie Briggs time: ____________ Date: ____________ Movements: ____________ Start time: ____________ Katie Briggs time: ____________ Date: ____________ Movements: ____________ Start time: ____________ Katie Briggs time: ____________  Date: ____________ Movements: ____________ Start time: ____________ Katie Briggs time:  ____________ Date: ____________ Movements: ____________ Start time: ____________ Katie Briggs time: ____________ Date: ____________ Movements: ____________ Start time: ____________ Katie Briggs time: ____________ Date: ____________ Movements: ____________ Start time: ____________ Katie Briggs time: ____________ Date: ____________ Movements: ____________ Start time: ____________ Katie Briggs time: ____________ Date: ____________ Movements: ____________ Start time: ____________ Katie Briggs time: ____________ Date: ____________ Movements: ____________ Start time: ____________ Katie Briggs time: ____________  Date: ____________ Movements: ____________ Start time: ____________ Katie Briggs time: ____________ Date: ____________ Movements: ____________ Start time: ____________ Katie Briggs time: ____________ Date: ____________ Movements: ____________ Start time: ____________ Katie Briggs time: ____________ Date: ____________ Movements: ____________ Start time: ____________ Katie Briggs time: ____________ Date: ____________ Movements: ____________ Start time: ____________ Katie Briggs time: ____________ Date: ____________ Movements: ____________ Start time: ____________ Katie Briggs time: ____________   This  information is not intended to replace advice given to you by your health care provider. Make sure you discuss any questions you have with your health care provider.   Document Released: 11/21/2006 Document Revised: 11/12/2014 Document Reviewed: 08/18/2012 Elsevier Interactive Patient Education [employer]

## 2015-08-21 NOTE — MAU Note (Signed)
Patient presents at 5027 weeks gestation with c/o contractions since yesterday. Fetus active. Denies bleeding but has noted a light green discharge.

## 2015-08-21 NOTE — MAU Provider Note (Signed)
History   G4P2012 at 27.6 wks in with c/o cramping on and off since yesterday. Sex yesterday then cramping started. Denies ROM or vag bleeding  CSN: [id number]  Arrival date and time: 08/21/15 1206   None     Chief Complaint  Patient presents with  . Abdominal Pain   HPI  OB History    Gravida Para Term Preterm AB TAB SAB Ectopic Multiple Living   0 1 0 1 0 0 2      Past Medical History  Diagnosis Date  . Asthma   . Anxiety   . Cervix prolapsed into vagina   . Depression   . OCD (obsessive compulsive disorder)   . Bipolar disorder (HCC)   . Supervision of normal pregnancy in second trimester 05/11/2015     [facility] Initiated Care at   05/11/15 FOB Kerney Elbe 24 yo Dating By  LMP and [place] Pap  05/11/15 GC/CT Initial:                36+wks: Genetic Screen NT/IT:  CF screen  Anatomic [place]  Flu vaccine  Tdap Recommended ~ 28wks Glucose Screen  2 hr GBS  Feed Preference  Contraception  Circumcision  Childbirth Classes  Pediatrician      Past Surgical History  Procedure Laterality Date  . None    . Colonoscopy N/A 11/12/2013    Procedure: COLONOSCOPY;  Surgeon: Corbin Ade, MD;  Location: [facility];  Service: Endoscopy;  Laterality: N/A;  2:40  . Dilation and curettage of uterus      Family History  Problem Relation Age of Onset  . Colon polyps Maternal Grandmother   . Depression Maternal Grandmother   . Colon cancer       maternal great grandmother  . Depression Mother   . Hypertension Mother   . Stroke Brother     while in womb  . Seizures Brother   . Asthma Son     Social History  Substance Use Topics  . Smoking status: Former Smoker    Types: Cigarettes  . Smokeless tobacco: Never Used  . Alcohol Use: No    Allergies: No Known Allergies  Prescriptions prior to admission  Medication Sig Dispense Refill Last Dose  . albuterol (PROVENTIL HFA;VENTOLIN HFA) 108 (90 BASE) MCG/ACT inhaler Inhale 2 puffs into the lungs every 6 (six) hours  as needed.   Taking  . Calcium Carbonate Antacid (TUMS PO) Take by mouth as needed.   Taking  . hydrocortisone 2.5 % lotion Apply topically 2 (two) times daily. (Patient taking differently: Apply topically as needed. ) 59 mL 0 Taking  . Prenatal Vit-Fe Fumarate-FA (MULTIVITAMIN-PRENATAL) 27-0.8 MG TABS tablet Take 1 tablet by mouth daily at 12 noon.   Taking    Review of Systems  Constitutional: Negative.   HENT: Negative.   Eyes: Negative.   Respiratory: Negative.   Cardiovascular: Negative.   Gastrointestinal: Positive for abdominal pain.  Genitourinary: Negative.   Musculoskeletal: Negative.   Skin: Negative.   Neurological: Negative.   Endo/Heme/Allergies: Negative.   Psychiatric/Behavioral: Negative.    Physical Exam   Blood pressure 102/63, pulse 83, temperature 98.1 F (36.7 C), temperature source Oral, resp. rate 16, height 5' 2.5" (1.588 m), weight 179 lb 8 oz (81.421 kg), last menstrual period 02/07/2015, unknown if currently breastfeeding.  Physical Exam  Constitutional: She is oriented to person, place, and time. She appears well-developed and well-nourished.  HENT:  Head: Normocephalic.  Eyes: Pupils are equal, round, and reactive to light.  Neck: Normal range of motion.  Cardiovascular: Normal rate, regular rhythm, normal heart sounds and intact distal pulses.   Respiratory: Effort normal and breath sounds normal.  GI: Soft. Bowel sounds are normal.  Genitourinary: Vagina normal and uterus normal.  Musculoskeletal: Normal range of motion.  Neurological: She is alert and oriented to person, place, and time. She has normal reflexes.  Skin: Skin is warm and dry.  Psychiatric: She has a normal mood and affect. Her behavior is normal. Judgment and thought content normal.    MAU Course  Procedures  MDM brackston hicks  Assessment and Plan  SVE cl/th/post/high, no contractions reassurring FHR pattern. D/c home  Corbin Falck DARLENE 08/21/2015, 1:21 PM

## 2015-09-01 ENCOUNTER — Encounter: Payer: Self-pay | Admitting: Advanced Practice Midwife

## 2015-09-01 ENCOUNTER — Ambulatory Visit (INDEPENDENT_AMBULATORY_CARE_PROVIDER_SITE_OTHER): Payer: BLUE CROSS/BLUE SHIELD | Admitting: Advanced Practice Midwife

## 2015-09-01 VITALS — BP 80/50 | HR 74 | Wt 179.2 lb

## 2015-09-01 DIAGNOSIS — Z1389 Encounter for screening for other disorder: Secondary | ICD-10-CM

## 2015-09-01 DIAGNOSIS — Z3482 Encounter for supervision of other normal pregnancy, second trimester: Secondary | ICD-10-CM

## 2015-09-01 DIAGNOSIS — Z331 Pregnant state, incidental: Secondary | ICD-10-CM

## 2015-09-01 DIAGNOSIS — Z3492 Encounter for supervision of normal pregnancy, unspecified, second trimester: Secondary | ICD-10-CM

## 2015-09-01 LAB — POCT URINALYSIS DIPSTICK
Blood, UA: NEGATIVE
Glucose, UA: NEGATIVE
Ketones, UA: NEGATIVE
Leukocytes, UA: NEGATIVE
Nitrite, UA: NEGATIVE
Protein, UA: NEGATIVE

## 2015-09-01 NOTE — Progress Notes (Signed)
[id number] 2764w3d Estimated Date of Delivery: 11/14/15  Blood pressure 80/50, pulse 74, weight 179 lb 3.2 oz (81.285 kg), last menstrual period 02/07/2015, unknown if currently breastfeeding.   BP weight and urine results all reviewed and noted.  Please refer to the obstetrical flow sheet for the fundal height and fetal heart rate documentation:  Patient reports good fetal movement, denies any bleeding and no rupture of membranes symptoms or regular contractions. Patient is without complaints other than normal pregnancy complaints.  All questions were answered.  Orders Placed This Encounter  Procedures  . POCT urinalysis dipstick    Plan:  Continued routine obstetrical care,   Return in about 3 weeks (around 09/22/2015) for LROB.

## 2015-09-08 ENCOUNTER — Telehealth: Payer: Self-pay | Admitting: Obstetrics & Gynecology

## 2015-09-08 NOTE — Telephone Encounter (Signed)
Pt c/o a lot pressure eyes and nasal cavity, stuffed up, pressure head, sore throat, no fever, nonproductive cough a lot since yesterday. Pt states is there anything OTC she can take? Pt informed can take OTC Robitussin, Lozenges, gargle with warm salt water. If no improvement to call office back. Pt verbalized understanding.

## 2015-09-09 ENCOUNTER — Telehealth: Payer: Self-pay | Admitting: Obstetrics & Gynecology

## 2015-09-09 MED ORDER — AZITHROMYCIN 250 MG PO TABS: ORAL_TABLET | ORAL | Status: DC

## 2015-09-09 NOTE — Telephone Encounter (Signed)
Pt states not feeling any better after taking Robitussin, productive cough, thick green in color, having chills. Per Dr. Despina HiddenEure e-scribed Azithromycin 250 mg two tablet first day, one tablet daily the remainder of pack, no refills.

## 2015-09-14 ENCOUNTER — Inpatient Hospital Stay (HOSPITAL_COMMUNITY)
Admission: AD | Admit: 2015-09-14 | Discharge: 2015-09-14 | Disposition: A | Discharge status: Home / Self Care | Payer: BLUE CROSS/BLUE SHIELD | Source: Ambulatory Visit | Attending: Family Medicine | Admitting: Family Medicine

## 2015-09-14 ENCOUNTER — Encounter (HOSPITAL_COMMUNITY): Payer: Self-pay | Admitting: *Deleted

## 2015-09-14 DIAGNOSIS — Z87891 Personal history of nicotine dependence: Secondary | ICD-10-CM | POA: Insufficient documentation

## 2015-09-14 DIAGNOSIS — Z36 Encounter for antenatal screening of mother: Secondary | ICD-10-CM

## 2015-09-14 DIAGNOSIS — O368130 Decreased fetal movements, third trimester, not applicable or unspecified: Secondary | ICD-10-CM | POA: Diagnosis not present

## 2015-09-14 DIAGNOSIS — Z3689 Encounter for other specified antenatal screening: Secondary | ICD-10-CM

## 2015-09-14 DIAGNOSIS — Z3A31 31 weeks gestation of pregnancy: Secondary | ICD-10-CM | POA: Diagnosis not present

## 2015-09-14 NOTE — MAU Provider Note (Signed)
History     CSN: [id number]  Arrival date and time: 09/14/15 0707   First Provider Initiated [id number] with Patient 09/14/15 [phone]      Chief Complaint  Patient presents with  . Decreased Fetal Movement   HPI Ms. Katie Briggs is a 24 y.o. [id number] at 818w2d who presents to MAU today with complaint of no fetal movement x 24 hours. The patient does endorse movement since arrival in MAU. She denies PO intake today. She also denies vaginal bleeding, LOF or contractions. She denies complications with the pregnancy.   OB History    Gravida Para Term Preterm AB TAB SAB Ectopic Multiple Living   4 2 2  0 1 0 1 0 0 2      Past Medical History  Diagnosis Date  . Asthma   . Anxiety   . Cervix prolapsed into vagina   . Depression   . OCD (obsessive compulsive disorder)   . Bipolar disorder (HCC)   . Supervision of normal pregnancy in second trimester 05/11/2015     [facility] Initiated Care at   05/11/15 FOB Kerney Elbehristopher Obey 24 yo Dating By  LMP and US Pap  05/11/15 GC/CT Initial:                36+wks: Genetic Screen NT/IT:  CF screen  Anatomic US  Flu vaccine  Tdap Recommended ~ 28wks Glucose Screen  2 hr GBS  Feed Preference  Contraception  Circumcision  Childbirth Classes  Pediatrician      Past Surgical History  Procedure Laterality Date  . None    . Colonoscopy N/A 11/12/2013    Procedure: COLONOSCOPY;  Surgeon: Corbin Adeobert M Rourk, MD;  Location: [facility];  Service: Endoscopy;  Laterality: N/A;  2:40  . Dilation and curettage of uterus      Family History  Problem Relation Age of Onset  . Colon polyps Maternal Grandmother   . Depression Maternal Grandmother   . Colon cancer       maternal great grandmother  . Depression Mother   . Hypertension Mother   . Stroke Brother     while in womb  . Seizures Brother   . Asthma Son     Social History  Substance Use Topics  . Smoking status: Former Smoker    Types: Cigarettes  . Smokeless tobacco: Never Used  . Alcohol  Use: No    Allergies: No Known Allergies  Prescriptions prior to admission  Medication Sig Dispense Refill Last Dose  . albuterol (PROVENTIL HFA;VENTOLIN HFA) 108 (90 BASE) MCG/ACT inhaler Inhale 2 puffs into the lungs every 6 (six) hours as needed.   Not Taking  . azithromycin (ZITHROMAX) 250 MG tablet Take 2 tablets first day and then one daily for remainder of pack 6 tablet 0   . Calcium Carbonate Antacid (TUMS PO) Take by mouth as needed.   Taking  . hydrocortisone 2.5 % lotion Apply topically 2 (two) times daily. (Patient not taking: Reported on 09/01/2015) 59 mL 0 Not Taking  . Prenatal Vit-Fe Fumarate-FA (MULTIVITAMIN-PRENATAL) 27-0.8 MG TABS tablet Take 1 tablet by mouth daily at 12 noon.   Taking    Review of Systems  Gastrointestinal: Negative for abdominal pain.  Genitourinary:       Neg - vaginal bleeding, discharge, LOF   Physical Exam   Blood pressure 116/74, pulse 94, temperature 98.2 F (36.8 C), resp. rate 18, last menstrual period 02/07/2015, unknown if currently breastfeeding.  Physical Exam  Nursing note and vitals reviewed. Constitutional: She is oriented to person, place, and time. She appears well-developed and well-nourished. No distress.  HENT:  Head: Normocephalic and atraumatic.  Cardiovascular: Normal rate.   Respiratory: Effort normal.  GI: Soft. She exhibits no distension and no mass. There is no tenderness. There is no rebound and no guarding.  Neurological: She is alert and oriented to person, place, and time.  Skin: Skin is warm and dry. No erythema.  Psychiatric: She has a normal mood and affect.   Fetal Monitoring: Baseline: 140 bpm, moderate variability, + accelerations, no decelerations Contractions: none  MAU Course  Procedures None  MDM NST reactive Patient feels movement while in MAU and is reassured Assessment and Plan  A: [facility] at [redacted]w[redacted]d Reactive NST  P: Discharge home Preterm labor precautions and kick counts  discussed Patient advised to follow-up with [facility] as scheduled or sooner PRN Patient may return to MAU as needed or if her condition were to change or worsen   Marny Lowenstein, PA-C  09/14/2015, 7:24 AM

## 2015-09-14 NOTE — Discharge Instructions (Signed)
Braxton Hicks Contractions °Contractions of the uterus can occur throughout pregnancy. Contractions are not always a sign that you are in labor.  °WHAT ARE BRAXTON HICKS CONTRACTIONS?  °Contractions that occur before labor are called Braxton Hicks contractions, or false labor. Toward the end of pregnancy (32-34 weeks), these contractions can develop more often and may become more forceful. This is not true labor because these contractions do not result in opening (dilatation) and thinning of the cervix. They are sometimes difficult to tell apart from true labor because these contractions can be forceful and people have different pain tolerances. You should not feel embarrassed if you go to the hospital with false labor. Sometimes, the only way to tell if you are in true labor is for your health care provider to look for changes in the cervix. °If there are no prenatal problems or other health problems associated with the pregnancy, it is completely safe to be sent home with false labor and await the onset of true labor. °HOW CAN YOU TELL THE DIFFERENCE BETWEEN TRUE AND FALSE LABOR? °False Labor °· The contractions of false labor are usually shorter and not as hard as those of true labor.   °· The contractions are usually irregular.   °· The contractions are often felt in the front of the lower abdomen and in the groin.   °· The contractions may go away when you walk around or change positions while lying down.   °· The contractions get weaker and are shorter lasting as time goes on.   °· The contractions do not usually become progressively stronger, regular, and closer together as with true labor.   °True Labor °· Contractions in true labor last 30-70 seconds, become very regular, usually become more intense, and increase in frequency.   °· The contractions do not go away with walking.   °· The discomfort is usually felt in the top of the uterus and spreads to the lower abdomen and low back.   °· True labor can be  determined by your health care provider with an exam. This will show that the cervix is dilating and getting thinner.   °WHAT TO REMEMBER °· Keep up with your usual exercises and follow other instructions given by your health care provider.   °· Take medicines as directed by your health care provider.   °· Keep your regular prenatal appointments.   °· Eat and drink lightly if you think you are going into labor.   °· If Braxton Hicks contractions are making you uncomfortable:   °¨ Change your position from lying down or resting to walking, or from walking to resting.   °¨ Sit and rest in a tub of warm water.   °¨ Drink 2-3 glasses of water. Dehydration may cause these contractions.   °¨ Do slow and deep breathing several times an hour.   °WHEN SHOULD I SEEK IMMEDIATE MEDICAL CARE? °Seek immediate medical care if: °· Your contractions become stronger, more regular, and closer together.   °· You have fluid leaking or gushing from your vagina.   °· You have a fever.   °· You pass blood-tinged mucus.   °· You have vaginal bleeding.   °· You have continuous abdominal pain.   °· You have low back pain that you never had before.   °· You feel your baby's head pushing down and causing pelvic pressure.   °· Your baby is not moving as much as it used to.   °  °This information is not intended to replace advice given to you by your health care provider. Make sure you discuss any questions you have with your health care   provider. °  °Document Released: 10/22/2005 Document Revised: 10/27/2013 Document Reviewed: 08/03/2013 °Elsevier Interactive Patient Education ©2016 [employer] Inc[employer] °Fetal Movement Counts °Patient Name: __________________________________________________ Patient Due Date: ____________________ °Performing a fetal movement count is highly recommended in high-risk pregnancies, but it is good for every pregnant woman to do. Your health care provider may ask you to start counting fetal movements at 28 weeks of the  pregnancy. Fetal movements often increase: °· After eating a full meal. °· After physical activity. °· After eating or drinking something sweet or cold. °· At rest. °Pay attention to when you feel the baby is most active. This will help you notice a pattern of your baby's sleep and wake cycles and what factors contribute to an increase in fetal movement. It is important to perform a fetal movement count at the same time each day when your baby is normally most active.  °HOW TO COUNT FETAL MOVEMENTS °· Find a quiet and comfortable area to sit or lie down on your left side. Lying on your left side provides the best blood and oxygen circulation to your baby. °· Write down the day and time on a sheet of paper or in a journal. °· Start counting kicks, flutters, swishes, rolls, or jabs in a 2-hour period. You should feel at least 10 movements within 2 hours. °· If you do not feel 10 movements in 2 hours, wait 2-3 hours and count again. Look for a change in the pattern or not enough counts in 2 hours. °SEEK MEDICAL CARE IF: °· You feel less than 10 counts in 2 hours, tried twice. °· There is no movement in over an hour. °· The pattern is changing or taking longer each day to reach 10 counts in 2 hours. °· You feel the baby is not moving as he or she usually does. °Date: ____________ Movements: ____________ Start time: ____________ Finish time: ____________  °Date: ____________ Movements: ____________ Start time: ____________ Finish time: ____________ °Date: ____________ Movements: ____________ Start time: ____________ Finish time: ____________ °Date: ____________ Movements: ____________ Start time: ____________ Finish time: ____________ °Date: ____________ Movements: ____________ Start time: ____________ Finish time: ____________ °Date: ____________ Movements: ____________ Start time: ____________ Finish time: ____________ °Date: ____________ Movements: ____________ Start time: ____________ Finish time: ____________ °Date:  ____________ Movements: ____________ Start time: ____________ Finish time: ____________  °Date: ____________ Movements: ____________ Start time: ____________ Finish time: ____________ °Date: ____________ Movements: ____________ Start time: ____________ Finish time: ____________ °Date: ____________ Movements: ____________ Start time: ____________ Finish time: ____________ °Date: ____________ Movements: ____________ Start time: ____________ Finish time: ____________ °Date: ____________ Movements: ____________ Start time: ____________ Finish time: ____________ °Date: ____________ Movements: ____________ Start time: ____________ Finish time: ____________ °Date: ____________ Movements: ____________ Start time: ____________ Finish time: ____________  °Date: ____________ Movements: ____________ Start time: ____________ Finish time: ____________ °Date: ____________ Movements: ____________ Start time: ____________ Finish time: ____________ °Date: ____________ Movements: ____________ Start time: ____________ Finish time: ____________ °Date: ____________ Movements: ____________ Start time: ____________ Finish time: ____________ °Date: ____________ Movements: ____________ Start time: ____________ Finish time: ____________ °Date: ____________ Movements: ____________ Start time: ____________ Finish time: ____________ °Date: ____________ Movements: ____________ Start time: ____________ Finish time: ____________  °Date: ____________ Movements: ____________ Start time: ____________ Finish time: ____________ °Date: ____________ Movements: ____________ Start time: ____________ Finish time: ____________ °Date: ____________ Movements: ____________ Start time: ____________ Finish time: ____________ °Date: ____________ Movements: ____________ Start time: ____________ Finish time: ____________ °Date: ____________ Movements: ____________ Start time: ____________ Finish time: ____________ °Date: ____________ Movements: ____________ Start  time: ____________ Finish time:   ____________ °Date: ____________ Movements: ____________ Start time: ____________ Finish time: ____________  °Date: ____________ Movements: ____________ Start time: ____________ Finish time: ____________ °Date: ____________ Movements: ____________ Start time: ____________ Finish time: ____________ °Date: ____________ Movements: ____________ Start time: ____________ Finish time: ____________ °Date: ____________ Movements: ____________ Start time: ____________ Finish time: ____________ °Date: ____________ Movements: ____________ Start time: ____________ Finish time: ____________ °Date: ____________ Movements: ____________ Start time: ____________ Finish time: ____________ °Date: ____________ Movements: ____________ Start time: ____________ Finish time: ____________  °Date: ____________ Movements: ____________ Start time: ____________ Finish time: ____________ °Date: ____________ Movements: ____________ Start time: ____________ Finish time: ____________ °Date: ____________ Movements: ____________ Start time: ____________ Finish time: ____________ °Date: ____________ Movements: ____________ Start time: ____________ Finish time: ____________ °Date: ____________ Movements: ____________ Start time: ____________ Finish time: ____________ °Date: ____________ Movements: ____________ Start time: ____________ Finish time: ____________ °Date: ____________ Movements: ____________ Start time: ____________ Finish time: ____________  °Date: ____________ Movements: ____________ Start time: ____________ Finish time: ____________ °Date: ____________ Movements: ____________ Start time: ____________ Finish time: ____________ °Date: ____________ Movements: ____________ Start time: ____________ Finish time: ____________ °Date: ____________ Movements: ____________ Start time: ____________ Finish time: ____________ °Date: ____________ Movements: ____________ Start time: ____________ Finish time:  ____________ °Date: ____________ Movements: ____________ Start time: ____________ Finish time: ____________ °Date: ____________ Movements: ____________ Start time: ____________ Finish time: ____________  °Date: ____________ Movements: ____________ Start time: ____________ Finish time: ____________ °Date: ____________ Movements: ____________ Start time: ____________ Finish time: ____________ °Date: ____________ Movements: ____________ Start time: ____________ Finish time: ____________ °Date: ____________ Movements: ____________ Start time: ____________ Finish time: ____________ °Date: ____________ Movements: ____________ Start time: ____________ Finish time: ____________ °Date: ____________ Movements: ____________ Start time: ____________ Finish time: ____________ °  °This information is not intended to replace advice given to you by your health care provider. Make sure you discuss any questions you have with your health care provider. °  °Document Released: 11/21/2006 Document Revised: 11/12/2014 Document Reviewed: 08/18/2012 °Elsevier Interactive Patient Education ©2016 Elsevier Inc. ° °

## 2015-09-14 NOTE — MAU Note (Signed)
Pt presents to MAU with complaints of no fetal movement in 24 hours. Denies any vaginal bleeding or LOF

## 2015-09-22 ENCOUNTER — Encounter: Payer: Self-pay | Admitting: Women's Health

## 2015-09-22 ENCOUNTER — Ambulatory Visit (INDEPENDENT_AMBULATORY_CARE_PROVIDER_SITE_OTHER): Payer: BLUE CROSS/BLUE SHIELD | Admitting: Women's Health

## 2015-09-22 VITALS — BP 104/62 | HR 84 | Wt 180.0 lb

## 2015-09-22 DIAGNOSIS — Z331 Pregnant state, incidental: Secondary | ICD-10-CM

## 2015-09-22 DIAGNOSIS — N898 Other specified noninflammatory disorders of vagina: Secondary | ICD-10-CM | POA: Diagnosis not present

## 2015-09-22 DIAGNOSIS — Z3493 Encounter for supervision of normal pregnancy, unspecified, third trimester: Secondary | ICD-10-CM

## 2015-09-22 DIAGNOSIS — Z1389 Encounter for screening for other disorder: Secondary | ICD-10-CM

## 2015-09-22 DIAGNOSIS — O26893 Other specified pregnancy related conditions, third trimester: Secondary | ICD-10-CM

## 2015-09-22 LAB — POCT WET PREP (WET MOUNT): Clue Cells Wet Prep Whiff POC: NEGATIVE

## 2015-09-22 LAB — POCT URINALYSIS DIPSTICK
Blood, UA: NEGATIVE
Glucose, UA: NEGATIVE
Ketones, UA: NEGATIVE
Leukocytes, UA: NEGATIVE
Nitrite, UA: NEGATIVE
Protein, UA: NEGATIVE

## 2015-09-22 NOTE — Progress Notes (Signed)
Low-risk OB appointment [id number] 7315w3d Estimated Date of Delivery: 11/14/15 BP 104/62 mmHg  Pulse 84  Wt 180 lb (81.647 kg)  LMP 02/07/2015 (Exact Date)  BP, weight, and urine reviewed.  Refer to obstetrical flow sheet for FH & FHR.  Reports good fm.  Denies regular uc's, lof, vb, or uti s/s. Some dizziness at work, feels like bp drops- sits down and it resolves. Is able to eat frequent snacks- make sure has protein. Green d/c x 1wk- no itching/odor/irritation.  Reports she got flu shot and tdap at work Spec exam: small amount creamy white nonodorous d/c, wet prep neg Reviewed ptl s/s, fkc. Plan:  Continue routine obstetrical care  F/U in 2wks for OB appointment

## 2015-09-22 NOTE — Patient Instructions (Addendum)
For Dizzy Spells:   This is usually related to either your blood sugar or your blood pressure dropping  Make sure you are staying well hydrated and drinking enough water so that your urine is clear  Eat small frequent meals and snacks containing protein (meat, eggs, nuts, cheese) so that your blood sugar doesn't drop  If you do get dizzy, sit/lay down and get you something to drink and a snack containing protein- you will usually start feeling better in 10-20 minutes    Call the office [phone]) or go to [facility] if:  You begin to have strong, frequent contractions  Your water breaks.  Sometimes it is a big gush of fluid, sometimes it is just a trickle that keeps getting your panties wet or running down your legs  You have vaginal bleeding.  It is normal to have a small amount of spotting if your cervix was checked.   You don't feel your baby moving like normal.  If you don't, get you something to eat and drink and lay down and focus on feeling your baby move.  You should feel at least 10 movements in 2 hours.  If you don't, you should call the office or go to [facility].     Preterm Labor Information Preterm labor is when labor starts at less than 37 weeks of pregnancy. The normal length of a pregnancy is 39 to 41 weeks. CAUSES Often, there is no identifiable underlying cause as to why a woman goes into preterm labor. One of the most common known causes of preterm labor is infection. Infections of the uterus, cervix, vagina, amniotic sac, bladder, kidney, or even the lungs (pneumonia) can cause labor to start. Other suspected causes of preterm labor include:   Urogenital infections, such as yeast infections and bacterial vaginosis.   Uterine abnormalities (uterine shape, uterine septum, fibroids, or bleeding from the placenta).   A cervix that has been operated on (it may fail to stay closed).   Malformations in the fetus.   Multiple gestations (twins, triplets,  and so on).   Breakage of the amniotic sac.  RISK FACTORS  Having a previous history of preterm labor.   Having premature rupture of membranes (PROM).   Having a placenta that covers the opening of the cervix (placenta previa).   Having a placenta that separates from the uterus (placental abruption).   Having a cervix that is too weak to hold the fetus in the uterus (incompetent cervix).   Having too much fluid in the amniotic sac (polyhydramnios).   Taking illegal drugs or smoking while pregnant.   Not gaining enough weight while pregnant.   Being younger than 38 and older than 26 years old.   Having a low socioeconomic status.   Being African American. SYMPTOMS Signs and symptoms of preterm labor include:   Menstrual-like cramps, abdominal pain, or back pain.  Uterine contractions that are regular, as frequent as six in an hour, regardless of their intensity (may be mild or painful).  Contractions that start on the top of the uterus and spread down to the lower abdomen and back.   A sense of increased pelvic pressure.   A watery or bloody mucus discharge that comes from the vagina.  TREATMENT Depending on the length of the pregnancy and other circumstances, your health care provider may suggest bed rest. If necessary, there are medicines that can be given to stop contractions and to mature the fetal lungs. If labor happens before 36  weeks of pregnancy, a prolonged hospital stay may be recommended. Treatment depends on the condition of both you and the fetus.  WHAT SHOULD YOU DO IF YOU THINK YOU ARE IN PRETERM LABOR? Call your health care provider right away. You will need to go to the hospital to get checked immediately. HOW CAN YOU PREVENT PRETERM LABOR IN FUTURE PREGNANCIES? You should:   Stop smoking if you smoke.  Maintain healthy weight gain and avoid chemicals and drugs that are not necessary.  Be watchful for any type of infection.  Inform  your health care provider if you have a known history of preterm labor.   This information is not intended to replace advice given to you by your health care provider. Make sure you discuss any questions you have with your health care provider.   Document Released: 01/12/2004 Document Revised: 06/24/2013 Document Reviewed: 11/24/2012 Elsevier Interactive Patient Education [employer]2016 Else[employer] Inc.

## 2015-10-06 ENCOUNTER — Ambulatory Visit (INDEPENDENT_AMBULATORY_CARE_PROVIDER_SITE_OTHER): Payer: BLUE CROSS/BLUE SHIELD | Admitting: Advanced Practice Midwife

## 2015-10-06 VITALS — BP 102/64 | HR 86 | Wt 181.0 lb

## 2015-10-06 DIAGNOSIS — Z1389 Encounter for screening for other disorder: Secondary | ICD-10-CM

## 2015-10-06 DIAGNOSIS — Z331 Pregnant state, incidental: Secondary | ICD-10-CM

## 2015-10-06 DIAGNOSIS — N898 Other specified noninflammatory disorders of vagina: Secondary | ICD-10-CM

## 2015-10-06 DIAGNOSIS — Z3493 Encounter for supervision of normal pregnancy, unspecified, third trimester: Secondary | ICD-10-CM

## 2015-10-06 LAB — POCT URINALYSIS DIPSTICK
Glucose, UA: NEGATIVE
Ketones, UA: NEGATIVE
Nitrite, UA: NEGATIVE
Protein, UA: NEGATIVE

## 2015-10-06 NOTE — Patient Instructions (Addendum)
Fax number [phone]  Unisom for sleep

## 2015-10-06 NOTE — Progress Notes (Signed)
[id number] 5256w3d Estimated Date of Delivery: 11/14/15  Last menstrual period 02/07/2015, unknown if currently breastfeeding.   BP weight and urine results all reviewed and noted.  Please refer to the obstetrical flow sheet for the fundal height and fetal heart rate documentation:  Patient reports good fetal movement, denies any bleeding and no rupture of membranes symptoms or regular contractions. Patient c.o greenish discharge, joint pain, difficulty sleeping.  SSE:  Normal appearting dc wet prep negative All questions were answered.  Orders Placed This Encounter  Procedures  . POCT urinalysis dipstick    Plan:  Continued routine obstetrical care, Unisom prn, sleep pillows.  FMLA papers will be faxed here.  Plans 11-06-15 through 6 weeks after delivery. Plans salpingectomy when 6 weeks is up, and will fill out additional FMLA for that when the time comes.   Return in about 2 weeks (around 10/20/2015) for LROB.

## 2015-10-09 ENCOUNTER — Encounter (HOSPITAL_COMMUNITY): Payer: Self-pay | Admitting: *Deleted

## 2015-10-09 ENCOUNTER — Inpatient Hospital Stay (HOSPITAL_COMMUNITY)
Admission: AD | Admit: 2015-10-09 | Discharge: 2015-10-09 | Disposition: A | Discharge status: Home / Self Care | Payer: BLUE CROSS/BLUE SHIELD | Source: Ambulatory Visit | Attending: Obstetrics & Gynecology | Admitting: Obstetrics & Gynecology

## 2015-10-09 DIAGNOSIS — N3946 Mixed incontinence: Secondary | ICD-10-CM | POA: Diagnosis not present

## 2015-10-09 DIAGNOSIS — Z3A34 34 weeks gestation of pregnancy: Secondary | ICD-10-CM | POA: Insufficient documentation

## 2015-10-09 DIAGNOSIS — Z3493 Encounter for supervision of normal pregnancy, unspecified, third trimester: Secondary | ICD-10-CM

## 2015-10-09 DIAGNOSIS — Z87891 Personal history of nicotine dependence: Secondary | ICD-10-CM | POA: Insufficient documentation

## 2015-10-09 DIAGNOSIS — O26893 Other specified pregnancy related conditions, third trimester: Secondary | ICD-10-CM | POA: Diagnosis not present

## 2015-10-09 LAB — URINALYSIS, ROUTINE W REFLEX MICROSCOPIC
Bilirubin Urine: NEGATIVE
Glucose, UA: NEGATIVE mg/dL
Hgb urine dipstick: NEGATIVE
Ketones, ur: 15 mg/dL — AB
Leukocytes, UA: NEGATIVE
Nitrite: NEGATIVE
Protein, ur: NEGATIVE mg/dL
Specific Gravity, Urine: 1.02 (ref 1.005–1.030)
pH: 6.5 (ref 5.0–8.0)

## 2015-10-09 LAB — AMNISURE RUPTURE OF MEMBRANE (ROM) NOT AT ARMC: Amnisure ROM: NEGATIVE

## 2015-10-09 NOTE — MAU Note (Signed)
Pt feels like something is leaking or she is peeing, but when she checks, nothing is there. Pt reports lower back pain that comes and goes since 1600 and goes and she also feels pressure in her pelvis for the last few weeks. + FM denies bleeding

## 2015-10-09 NOTE — MAU Note (Signed)
About 4:00, felt like something was coming out.  Little bit at a time, not peeing.  Having a lot of lower back pain, stomach is tightening up and feeling a lot of pressure

## 2015-10-09 NOTE — MAU Provider Note (Signed)
History   [id number] @ 34.6 wks in with c/o vaginal leaking on self.  CSN: [id number]  Arrival date & time 10/09/15  1803   None     Chief Complaint  Patient presents with  . Labor Eval    HPI  Past Medical History  Diagnosis Date  . Asthma   . Anxiety   . Cervix prolapsed into vagina   . Depression   . OCD (obsessive compulsive disorder)   . Bipolar disorder (HCC)   . Supervision of normal pregnancy in second trimester 05/11/2015     [facility] Care at   05/11/15 FOB Kerney Elbe 24 yo Dating By  LMP and [place] Pap  05/11/15 GC/CT Initial:                36+wks: Genetic Screen NT/IT:  CF screen  Anatomic [place]  Flu vaccine  Tdap Recommended ~ 28wks Glucose Screen  2 hr GBS  Feed Preference  Contraception  Circumcision  Childbirth Classes  Pediatrician      Past Surgical History  Procedure Laterality Date  . None    . Colonoscopy N/A 11/12/2013    Procedure: COLONOSCOPY;  Surgeon: Corbin Ade, MD;  Location: [facility];  Service: Endoscopy;  Laterality: N/A;  2:40  . Dilation and curettage of uterus      Family History  Problem Relation Age of Onset  . Colon polyps Maternal Grandmother   . Depression Maternal Grandmother   . Colon cancer       maternal great grandmother  . Depression Mother   . Hypertension Mother   . Stroke Brother     while in womb  . Seizures Brother   . Asthma Son     Social History  Substance Use Topics  . Smoking status: Former Smoker    Types: Cigarettes  . Smokeless tobacco: Never Used  . Alcohol Use: No    OB History    Gravida Para Term Preterm AB TAB SAB Ectopic Multiple Living   0 1 0 1 0 0 2      Review of Systems  Constitutional: Negative.   HENT: Negative.   Eyes: Negative.   Respiratory: Negative.   Cardiovascular: Negative.   Gastrointestinal: Negative.   Endocrine: Negative.   Genitourinary: Negative.   Musculoskeletal: Negative.   Skin: Negative.   Allergic/Immunologic: Negative.    Neurological: Negative.   Hematological: Negative.   Psychiatric/Behavioral: Negative.     Allergies  Review of patient's allergies indicates no known allergies.  Home Medications  No current outpatient prescriptions on file.  BP 105/69 mmHg  Pulse 92  Temp(Src) 98.1 F (36.7 C) (Oral)  Resp 18  LMP 02/07/2015 (Exact Date)  Physical Exam  Constitutional: She is oriented to person, place, and time. She appears well-developed and well-nourished.  HENT:  Head: Normocephalic.  Eyes: Pupils are equal, round, and reactive to light.  Cardiovascular: Normal rate, regular rhythm, normal heart sounds and intact distal pulses.   Pulmonary/Chest: Effort normal and breath sounds normal.  Abdominal: Soft. Bowel sounds are normal.  Genitourinary: Vagina normal and uterus normal.  Musculoskeletal: Normal range of motion.  Neurological: She is alert and oriented to person, place, and time. She has normal reflexes.  Skin: Skin is warm and dry.  Psychiatric: She has a normal mood and affect. Her behavior is normal. Judgment and thought content normal.    MAU Course  Procedures (including critical care time)  Labs Reviewed  URINALYSIS, ROUTINE W REFLEX MICROSCOPIC (NOT AT [facility]) - Abnormal; Notable for the following:    Ketones, ur 15 (*)    All other components within normal limits  AMNISURE RUPTURE OF MEMBRANE (ROM) NOT AT [facility]   No results found.   1. Supervision of normal pregnancy, third trimester       MDM  Amnisure neg, urine normal. FHR pattern reassuring. SVR ft/th/post/high. Will d/c home

## 2015-10-11 ENCOUNTER — Encounter: Payer: Self-pay | Admitting: Obstetrics and Gynecology

## 2015-10-11 ENCOUNTER — Telehealth: Payer: Self-pay | Admitting: Obstetrics & Gynecology

## 2015-10-11 ENCOUNTER — Ambulatory Visit (INDEPENDENT_AMBULATORY_CARE_PROVIDER_SITE_OTHER): Payer: BLUE CROSS/BLUE SHIELD | Admitting: Obstetrics and Gynecology

## 2015-10-11 VITALS — BP 100/60 | HR 83 | Wt 180.5 lb

## 2015-10-11 DIAGNOSIS — Z1389 Encounter for screening for other disorder: Secondary | ICD-10-CM

## 2015-10-11 DIAGNOSIS — Z331 Pregnant state, incidental: Secondary | ICD-10-CM

## 2015-10-11 DIAGNOSIS — Z3493 Encounter for supervision of normal pregnancy, unspecified, third trimester: Secondary | ICD-10-CM

## 2015-10-11 DIAGNOSIS — Z3A35 35 weeks gestation of pregnancy: Secondary | ICD-10-CM

## 2015-10-11 LAB — POCT URINALYSIS DIPSTICK
Blood, UA: NEGATIVE
Glucose, UA: NEGATIVE
Ketones, UA: NEGATIVE
Leukocytes, UA: NEGATIVE
Nitrite, UA: NEGATIVE
Protein, UA: NEGATIVE

## 2015-10-11 NOTE — Telephone Encounter (Signed)
Pt c/o "excruciating pain in joints, unable to sleep." Pt also c/o shooting pain in legs and hip. Pt states taking Unisom for sleep but is making her drowsy and causing complications at work.

## 2015-10-11 NOTE — Progress Notes (Signed)
Pt worked in today for joint pain and pain in her hip. Pt states that the joint pain is worse at night after work. Pt states that the hip pain started yesterday and she was concerned about falling at work. Pt unable to sleep due to the pain. Pt states that she is a blood tinged discharge, since last night.

## 2015-10-11 NOTE — Telephone Encounter (Signed)
Pt called stating that she would like a call back from the nurse, Pt did not give the reason why. Please contact pt

## 2015-10-11 NOTE — Progress Notes (Signed)
[id number] 3573w1d Estimated Date of Delivery: 11/14/15 pt needs to be taken out of work,  Blood pressure 100/60, pulse 83, weight 81.874 kg (180 lb 8 oz), last menstrual period 02/07/2015, unknown if currently breastfeeding.   refer to the ob flow sheet for FH and FHR, also BP, Wt, Urine results:notable for neg protein works at [employer]. Desires to start FMLA  Patient reports   good fetal movement, denies any bleeding and no rupture of membranes symptoms or regular contractions. Patient complaints:.  Questions were answered. Assessment: LROB [id number] @ 6973w1d  To begin FMLA  Plan:  Continued routine obstetrical care, given out of work note  F/u in 1 weeks for lrob

## 2015-10-17 ENCOUNTER — Telehealth: Payer: Self-pay | Admitting: Obstetrics & Gynecology

## 2015-10-17 NOTE — Telephone Encounter (Signed)
Pt c/o discharge with streaks of blood, irregular contractions, has not felt the baby move since 3 am this morning. Pt informed to eat or drink something now and lay on side, 10/2hour kick count, if does not feel baby move 10 times in 2 hours call our office back. Pt informed to push fluids, take tylenol as needed for Braxton Hicks contraction. Pt verbalized understanding.

## 2015-10-20 ENCOUNTER — Ambulatory Visit (INDEPENDENT_AMBULATORY_CARE_PROVIDER_SITE_OTHER): Payer: BLUE CROSS/BLUE SHIELD | Admitting: Obstetrics & Gynecology

## 2015-10-20 ENCOUNTER — Encounter: Payer: Self-pay | Admitting: Obstetrics & Gynecology

## 2015-10-20 VITALS — BP 90/60 | HR 98 | Temp 98.5°F | Wt 179.0 lb

## 2015-10-20 DIAGNOSIS — Z3685 Encounter for antenatal screening for Streptococcus B: Secondary | ICD-10-CM

## 2015-10-20 DIAGNOSIS — Z118 Encounter for screening for other infectious and parasitic diseases: Secondary | ICD-10-CM

## 2015-10-20 DIAGNOSIS — Z1159 Encounter for screening for other viral diseases: Secondary | ICD-10-CM

## 2015-10-20 DIAGNOSIS — Z3493 Encounter for supervision of normal pregnancy, unspecified, third trimester: Secondary | ICD-10-CM

## 2015-10-20 DIAGNOSIS — Z331 Pregnant state, incidental: Secondary | ICD-10-CM

## 2015-10-20 DIAGNOSIS — Z1389 Encounter for screening for other disorder: Secondary | ICD-10-CM

## 2015-10-20 LAB — POCT URINALYSIS DIPSTICK
Blood, UA: NEGATIVE
Glucose, UA: 1
Leukocytes, UA: NEGATIVE
Nitrite, UA: NEGATIVE
Protein, UA: NEGATIVE

## 2015-10-20 LAB — OB RESULTS CONSOLE GBS: GBS: NEGATIVE

## 2015-10-20 NOTE — Progress Notes (Signed)
[id number] 6274w3d Estimated Date of Delivery: 11/14/15  Blood pressure 90/60, pulse 98, temperature 98.5 F (36.9 C), weight 179 lb (81.194 kg), last menstrual period 02/07/2015, unknown if currently breastfeeding.   BP weight and urine results all reviewed and noted.  Please refer to the obstetrical flow sheet for the fundal height and fetal heart rate documentation:  Patient reports good fetal movement, denies any bleeding and no rupture of membranes symptoms or regular contractions. Patient is without complaints. All questions were answered.  Orders Placed This Encounter  Procedures  . GC/Chlamydia Probe Amp  . Strep Gp B NAA  . POCT urinalysis dipstick    Plan:  Continued routine obstetrical care, GBS done today  Return in about 1 week (around 10/27/2015) for LROB.

## 2015-10-21 LAB — GC/CHLAMYDIA PROBE AMP
Chlamydia trachomatis, NAA: NEGATIVE
Neisseria gonorrhoeae by PCR: NEGATIVE

## 2015-10-22 LAB — STREP GP B NAA: Strep Gp B NAA: NEGATIVE

## 2015-10-26 ENCOUNTER — Inpatient Hospital Stay (HOSPITAL_COMMUNITY)
Admission: AD | Admit: 2015-10-26 | Discharge: 2015-10-26 | Disposition: A | Discharge status: Home / Self Care | Payer: BLUE CROSS/BLUE SHIELD | Source: Ambulatory Visit | Attending: Obstetrics and Gynecology | Admitting: Obstetrics and Gynecology

## 2015-10-26 ENCOUNTER — Encounter (HOSPITAL_COMMUNITY): Payer: Self-pay

## 2015-10-26 DIAGNOSIS — N898 Other specified noninflammatory disorders of vagina: Secondary | ICD-10-CM

## 2015-10-26 DIAGNOSIS — O26893 Other specified pregnancy related conditions, third trimester: Secondary | ICD-10-CM | POA: Insufficient documentation

## 2015-10-26 DIAGNOSIS — Z3A37 37 weeks gestation of pregnancy: Secondary | ICD-10-CM | POA: Insufficient documentation

## 2015-10-26 DIAGNOSIS — Z87891 Personal history of nicotine dependence: Secondary | ICD-10-CM | POA: Diagnosis not present

## 2015-10-26 LAB — AMNISURE RUPTURE OF MEMBRANE (ROM) NOT AT ARMC: Amnisure ROM: NEGATIVE

## 2015-10-26 NOTE — Discharge Instructions (Signed)
Braxton Hicks Contractions °Contractions of the uterus can occur throughout pregnancy. Contractions are not always a sign that you are in labor.  °WHAT ARE BRAXTON HICKS CONTRACTIONS?  °Contractions that occur before labor are called Braxton Hicks contractions, or false labor. Toward the end of pregnancy (32-34 weeks), these contractions can develop more often and may become more forceful. This is not true labor because these contractions do not result in opening (dilatation) and thinning of the cervix. They are sometimes difficult to tell apart from true labor because these contractions can be forceful and people have different pain tolerances. You should not feel embarrassed if you go to the hospital with false labor. Sometimes, the only way to tell if you are in true labor is for your health care provider to look for changes in the cervix. °If there are no prenatal problems or other health problems associated with the pregnancy, it is completely safe to be sent home with false labor and await the onset of true labor. °HOW CAN YOU TELL THE DIFFERENCE BETWEEN TRUE AND FALSE LABOR? °False Labor °· The contractions of false labor are usually shorter and not as hard as those of true labor.   °· The contractions are usually irregular.   °· The contractions are often felt in the front of the lower abdomen and in the groin.   °· The contractions may go away when you walk around or change positions while lying down.   °· The contractions get weaker and are shorter lasting as time goes on.   °· The contractions do not usually become progressively stronger, regular, and closer together as with true labor.   °True Labor °· Contractions in true labor last 30-70 seconds, become very regular, usually become more intense, and increase in frequency.   °· The contractions do not go away with walking.   °· The discomfort is usually felt in the top of the uterus and spreads to the lower abdomen and low back.   °· True labor can be  determined by your health care provider with an exam. This will show that the cervix is dilating and getting thinner.   °WHAT TO REMEMBER °· Keep up with your usual exercises and follow other instructions given by your health care provider.   °· Take medicines as directed by your health care provider.   °· Keep your regular prenatal appointments.   °· Eat and drink lightly if you think you are going into labor.   °· If Braxton Hicks contractions are making you uncomfortable:   °¨ Change your position from lying down or resting to walking, or from walking to resting.   °¨ Sit and rest in a tub of warm water.   °¨ Drink 2-3 glasses of water. Dehydration may cause these contractions.   °¨ Do slow and deep breathing several times an hour.   °WHEN SHOULD I SEEK IMMEDIATE MEDICAL CARE? °Seek immediate medical care if: °· Your contractions become stronger, more regular, and closer together.   °· You have fluid leaking or gushing from your vagina.   °· You have a fever.   °· You pass blood-tinged mucus.   °· You have vaginal bleeding.   °· You have continuous abdominal pain.   °· You have low back pain that you never had before.   °· You feel your baby's head pushing down and causing pelvic pressure.   °· Your baby is not moving as much as it used to.   °  °This information is not intended to replace advice given to you by your health care provider. Make sure you discuss any questions you have with your health care   provider. °  °Document Released: 10/22/2005 Document Revised: 10/27/2013 Document Reviewed: 08/03/2013 °Elsevier Interactive Patient Education ©2016 [employer] ° °

## 2015-10-26 NOTE — MAU Note (Signed)
Pt states she thinks her water may have broke at 1915, occasional uc's.

## 2015-10-26 NOTE — MAU Provider Note (Signed)
History     CSN: [id number]  Arrival date and time: 10/26/15 2014   First Provider Initiated Contact with Patient 10/26/15 2121      Chief Complaint  Patient presents with  . Rupture of Membranes   HPI  24yo [id number] at 37.2w presents to the MAU for possible ROM. She felt some fluid come out and her underwear was wet around 7PM tonight. She denies any foul odor, new/changed vaginal discharge, or dysurea. No fever, no VB, decreased FM, no contractions.    [facility]  Initiated Care at  11 weejs  FOB Kerney ElbeChristopher Voong 24 yo  Dating By LMP and 11 week US  Pap 05/11/15 normal  GC/CT Initial: -/- 36+wks:  Genetic Screen NT/IT: neg  CF screen  negative  Anatomic US Normal girl  Flu vaccine 08/10/15  Tdap Recommended ~ 28wks  Glucose Screen  2 hr 70/113/92  GBS   Feed Preference breast  Contraception salpingectomhy  Circumcision n/a  Childbirth Classes declined  Pediatrician [facility]       Past Medical History  Diagnosis Date  . Asthma   . Anxiety   . Cervix prolapsed into vagina   . Depression   . OCD (obsessive compulsive disorder)   . Bipolar disorder (HCC)   . Supervision of normal pregnancy in second trimester 05/11/2015     [facility] Care at   05/11/15 FOB Kerney Elbehristopher Cohick 24 yo Dating By  LMP and US Pap  05/11/15 GC/CT Initial:                36+wks: Genetic Screen NT/IT:  CF screen  Anatomic US  Flu vaccine  Tdap Recommended ~ 28wks Glucose Screen  2 hr GBS  Feed Preference  Contraception  Circumcision  Childbirth Classes  Pediatrician      Past Surgical History  Procedure Laterality Date  . None    . Colonoscopy N/A 11/12/2013    Procedure: COLONOSCOPY;  Surgeon: Corbin Adeobert M Rourk, MD;  [facility]: [facility];  Service: Endoscopy;  Laterality: N/A;  2:40  . Dilation and curettage of uterus      Family History  Problem Relation Age of Onset  . Colon polyps Maternal Grandmother   . Depression Maternal  Grandmother   . Colon cancer       maternal great grandmother  . Depression Mother   . Hypertension Mother   . Stroke Brother     while in womb  . Seizures Brother   . Asthma Son     Social History  Substance Use Topics  . Smoking status: Former Smoker    Types: Cigarettes  . Smokeless tobacco: Never Used  . Alcohol Use: No    Allergies: No Known Allergies  Prescriptions prior to admission  Medication Sig Dispense Refill Last Dose  . calcium carbonate (TUMS - DOSED IN MG ELEMENTAL CALCIUM) 500 MG chewable tablet Chew 2 tablets by mouth 3 (three) times daily as needed for indigestion or heartburn.    10/25/2015 at Unknown time  . hydrocortisone 2.5 % lotion Apply 1 application topically 2 (two) times daily as needed (for itching).   Past Week at Unknown time  . Prenatal Vit-Fe Fumarate-FA (PRENATAL MULTIVITAMIN) TABS tablet Take 1 tablet by mouth daily.   10/26/2015 at Unknown time  . albuterol (PROVENTIL HFA;VENTOLIN HFA) 108 (90 BASE) MCG/ACT inhaler Inhale 2 puffs into the lungs every 6 (six) hours as needed for wheezing or shortness of breath. Reported on 10/20/2015  Rescue    ROS  ROS negative with exception of those mentioned in HPI.   Physical Exam   Blood pressure 117/74, pulse 92, temperature 97.6 F (36.4 C), temperature source Oral, resp. rate 16, height  (1.575 m), weight 83.008 kg (183 lb), last menstrual period 02/07/2015, SpO2 100 %, unknown if currently breastfeeding.  Physical Exam  Gen: No acute distress, cooperative Lungs: No Increased WOB, CTAB Cardiac: RRR Abd: nttp, gravid SSE: thick white vaginal discharge, no pooling noted   MAU Course  Procedures Amniosure sent  Results for orders placed or performed during the hospital encounter of 10/26/15 (from the past 24 hour(s))  Amnisure rupture of membrane (rom)not at [facility]     Status: None   Collection Time: 10/26/15  9:20 PM  Result Value Ref Range   Amnisure ROM NEGATIVE     Assessment  and Plan  24yo [id number] at 37.2w presents to the MAU for possible ROM. Amniosure, Fern, and pooling negative. Labor precautions given.   Katie Briggs  10/26/2015, 9:58 PM   CNM attestation:  I have seen and examined this patient; I agree with above documentation in the resident's note.   Katie Briggs is a 24 y.o. [id number] reporting leaking +FM, denies VB, contractions  PE: BP 122/73 mmHg  Pulse 94  Temp(Src) 98.2 F (36.8 C) (Oral)  Resp 18  Ht  (1.575 m)  Wt 83.008 kg (183 lb)  BMI 33.46 kg/m2  SpO2 100%  LMP 02/07/2015 (Exact Date) Gen: calm comfortable, NAD Resp: normal effort, no distress Abd: gravid  ROS, labs, PMH reviewed NST reactive   Plan: - fetal kick counts reinforced,  labor precautions - continue routine follow up in OB clinic  Cam Hai, CNM 12:26 AM 10/27/2015

## 2015-10-27 ENCOUNTER — Ambulatory Visit (INDEPENDENT_AMBULATORY_CARE_PROVIDER_SITE_OTHER): Payer: BLUE CROSS/BLUE SHIELD | Admitting: Obstetrics & Gynecology

## 2015-10-27 VITALS — BP 110/60 | HR 76 | Wt 180.0 lb

## 2015-10-27 DIAGNOSIS — Z3493 Encounter for supervision of normal pregnancy, unspecified, third trimester: Secondary | ICD-10-CM

## 2015-10-27 DIAGNOSIS — Z1389 Encounter for screening for other disorder: Secondary | ICD-10-CM

## 2015-10-27 DIAGNOSIS — Z3A37 37 weeks gestation of pregnancy: Secondary | ICD-10-CM

## 2015-10-27 DIAGNOSIS — Z331 Pregnant state, incidental: Secondary | ICD-10-CM

## 2015-10-27 LAB — POCT URINALYSIS DIPSTICK
Blood, UA: NEGATIVE
Glucose, UA: 1
Leukocytes, UA: NEGATIVE
Nitrite, UA: NEGATIVE

## 2015-10-27 NOTE — Progress Notes (Signed)
[id number] 3231w3d Estimated Date of Delivery: 11/14/15  Blood pressure 110/60, pulse 76, weight 180 lb (81.647 kg), last menstrual period 02/07/2015, unknown if currently breastfeeding.   BP weight and urine results all reviewed and noted.  Please refer to the obstetrical flow sheet for the fundal height and fetal heart rate documentation:  Patient reports good fetal movement, denies any bleeding and no rupture of membranes symptoms or regular contractions. Patient is without complaints. All questions were answered.  Orders Placed This Encounter  Procedures  . POCT urinalysis dipstick    Plan:  Continued routine obstetrical care,   No Follow-up on file.

## 2015-10-28 DIAGNOSIS — Z029 Encounter for administrative examinations, unspecified: Secondary | ICD-10-CM

## 2015-11-01 ENCOUNTER — Inpatient Hospital Stay (HOSPITAL_COMMUNITY)
Admission: AD | Admit: 2015-11-01 | Discharge: 2015-11-01 | Disposition: A | Discharge status: Home / Self Care | Payer: BLUE CROSS/BLUE SHIELD | Source: Ambulatory Visit | Attending: Family Medicine | Admitting: Family Medicine

## 2015-11-01 ENCOUNTER — Encounter (HOSPITAL_COMMUNITY): Payer: Self-pay

## 2015-11-01 LAB — URINALYSIS, ROUTINE W REFLEX MICROSCOPIC
Bilirubin Urine: NEGATIVE
Glucose, UA: NEGATIVE mg/dL
Hgb urine dipstick: NEGATIVE
Ketones, ur: NEGATIVE mg/dL
Leukocytes, UA: NEGATIVE
Nitrite: NEGATIVE
Protein, ur: NEGATIVE mg/dL
Specific Gravity, Urine: <1.005 — ABNORMAL LOW (ref 1.005–1.030)
pH: 6.5 (ref 5.0–8.0)

## 2015-11-01 LAB — POCT FERN TEST: POCT Fern Test: NEGATIVE

## 2015-11-01 NOTE — MAU Note (Signed)
Pt reports contractions q 10 minutes and she has had a headache since about 9 pm. Also reports ?leaking fluid since 2000.

## 2015-11-01 NOTE — Progress Notes (Addendum)
Notified of pt arrival and complaint. Will offer pt tylenol or fioricet and juice

## 2015-11-01 NOTE — Discharge Instructions (Signed)
Third Trimester of Pregnancy °The third trimester is from week 29 through week 42, months 7 through 9. The third trimester is a time when the fetus is growing rapidly. At the end of the ninth month, the fetus is about 20 inches in length and weighs 6-10 pounds.  °BODY CHANGES °Your body goes through many changes during pregnancy. The changes vary from woman to woman.  °· Your weight will continue to increase. You can expect to gain 25-35 pounds (11-16 kg) by the end of the pregnancy. °· You may begin to get stretch marks on your hips, abdomen, and breasts. °· You may urinate more often because the fetus is moving lower into your pelvis and pressing on your bladder. °· You may develop or continue to have heartburn as a result of your pregnancy. °· You may develop constipation because certain hormones are causing the muscles that push waste through your intestines to slow down. °· You may develop hemorrhoids or swollen, bulging veins (varicose veins). °· You may have pelvic pain because of the weight gain and pregnancy hormones relaxing your joints between the bones in your pelvis. Backaches may result from overexertion of the muscles supporting your posture. °· You may have changes in your hair. These can include thickening of your hair, rapid growth, and changes in texture. Some women also have hair loss during or after pregnancy, or hair that feels dry or thin. Your hair will most likely return to normal after your baby is born. °· Your breasts will continue to grow and be tender. A yellow discharge may leak from your breasts called colostrum. °· Your belly button may stick out. °· You may feel short of breath because of your expanding uterus. °· You may notice the fetus "dropping," or moving lower in your abdomen. °· You may have a bloody mucus discharge. This usually occurs a few days to a week before labor begins. °· Your cervix becomes thin and soft (effaced) near your due date. °WHAT TO EXPECT AT YOUR PRENATAL  EXAMS  °You will have prenatal exams every 2 weeks until week 36. Then, you will have weekly prenatal exams. During a routine prenatal visit: °· You will be weighed to make sure you and the fetus are growing normally. °· Your blood pressure is taken. °· Your abdomen will be measured to track your baby's growth. °· The fetal heartbeat will be listened to. °· Any test results from the previous visit will be discussed. °· You may have a cervical check near your due date to see if you have effaced. °At around 36 weeks, your caregiver will check your cervix. At the same time, your caregiver will also perform a test on the secretions of the vaginal tissue. This test is to determine if a type of bacteria, Group B streptococcus, is present. Your caregiver will explain this further. °Your caregiver may ask you: °· What your birth plan is. °· How you are feeling. °· If you are feeling the baby move. °· If you have had any abnormal symptoms, such as leaking fluid, bleeding, severe headaches, or abdominal cramping. °· If you are using any tobacco products, including cigarettes, chewing tobacco, and electronic cigarettes. °· If you have any questions. °Other tests or screenings that may be performed during your third trimester include: °· Blood tests that check for low iron levels (anemia). °· Fetal testing to check the health, activity level, and growth of the fetus. Testing is done if you have certain medical conditions or if   there are problems during the pregnancy. °· HIV (human immunodeficiency virus) testing. If you are at high risk, you may be screened for HIV during your third trimester of pregnancy. °FALSE LABOR °You may feel small, irregular contractions that eventually go away. These are called Braxton Hicks contractions, or false labor. Contractions may last for hours, days, or even weeks before true labor sets in. If contractions come at regular intervals, intensify, or become painful, it is best to be seen by your  caregiver.  °SIGNS OF LABOR  °· Menstrual-like cramps. °· Contractions that are 5 minutes apart or less. °· Contractions that start on the top of the uterus and spread down to the lower abdomen and back. °· A sense of increased pelvic pressure or back pain. °· A watery or bloody mucus discharge that comes from the vagina. °If you have any of these signs before the 37th week of pregnancy, call your caregiver right away. You need to go to the hospital to get checked immediately. °HOME CARE INSTRUCTIONS  °· Avoid all smoking, herbs, alcohol, and unprescribed drugs. These chemicals affect the formation and growth of the baby. °· Do not use any tobacco products, including cigarettes, chewing tobacco, and electronic cigarettes. If you need help quitting, ask your health care provider. You may receive counseling support and other resources to help you quit. °· Follow your caregiver's instructions regarding medicine use. There are medicines that are either safe or unsafe to take during pregnancy. °· Exercise only as directed by your caregiver. Experiencing uterine cramps is a good sign to stop exercising. °· Continue to eat regular, healthy meals. °· Wear a good support bra for breast tenderness. °· Do not use hot tubs, steam rooms, or saunas. °· Wear your seat belt at all times when driving. °· Avoid raw meat, uncooked cheese, cat litter boxes, and soil used by cats. These carry germs that can cause birth defects in the baby. °· Take your prenatal vitamins. °· Take 1500-2000 mg of calcium daily starting at the 20th week of pregnancy until you deliver your baby. °· Try taking a stool softener (if your caregiver approves) if you develop constipation. Eat more high-fiber foods, such as fresh vegetables or fruit and whole grains. Drink plenty of fluids to keep your urine clear or pale yellow. °· Take warm sitz baths to soothe any pain or discomfort caused by hemorrhoids. Use hemorrhoid cream if your caregiver approves. °· If  you develop varicose veins, wear support hose. Elevate your feet for 15 minutes, 3-4 times a day. Limit salt in your diet. °· Avoid heavy lifting, wear low heal shoes, and practice good posture. °· Rest a lot with your legs elevated if you have leg cramps or low back pain. °· Visit your dentist if you have not gone during your pregnancy. Use a soft toothbrush to brush your teeth and be gentle when you floss. °· A sexual relationship may be continued unless your caregiver directs you otherwise. °· Do not travel far distances unless it is absolutely necessary and only with the approval of your caregiver. °· Take prenatal classes to understand, practice, and ask questions about the labor and delivery. °· Make a trial run to the hospital. °· Pack your hospital bag. °· Prepare the baby's nursery. °· Continue to go to all your prenatal visits as directed by your caregiver. °SEEK MEDICAL CARE IF: °· You are unsure if you are in labor or if your water has broken. °· You have dizziness. °· You have   mild pelvic cramps, pelvic pressure, or nagging pain in your abdominal area. °· You have persistent nausea, vomiting, or diarrhea. °· You have a bad smelling vaginal discharge. °· You have pain with urination. °SEEK IMMEDIATE MEDICAL CARE IF:  °· You have a fever. °· You are leaking fluid from your vagina. °· You have spotting or bleeding from your vagina. °· You have severe abdominal cramping or pain. °· You have rapid weight loss or gain. °· You have shortness of breath with chest pain. °· You notice sudden or extreme swelling of your face, hands, ankles, feet, or legs. °· You have not felt your baby move in over an hour. °· You have severe headaches that do not go away with medicine. °· You have vision changes. °  °This information is not intended to replace advice given to you by your health care provider. Make sure you discuss any questions you have with your health care provider. °  °Document Released: 10/16/2001 Document  Revised: 11/12/2014 Document Reviewed: 12/23/2012 °[employer] Interactive Patient Education ©2016 [employer] °Fetal Movement Counts °Patient Name: __________________________________________________ Patient Due Date: ____________________ °Performing a fetal movement count is highly recommended in high-risk pregnancies, but it is good for every pregnant woman to do. Your health care provider may ask you to start counting fetal movements at 28 weeks of the pregnancy. Fetal movements often increase: °· After eating a full meal. °· After physical activity. °· After eating or drinking something sweet or cold. °· At rest. °Pay attention to when you feel the baby is most active. This will help you notice a pattern of your baby's sleep and wake cycles and what factors contribute to an increase in fetal movement. It is important to perform a fetal movement count at the same time each day when your baby is normally most active.  °HOW TO COUNT FETAL MOVEMENTS °· Find a quiet and comfortable area to sit or lie down on your left side. Lying on your left side provides the best blood and oxygen circulation to your baby. °· Write down the day and time on a sheet of paper or in a journal. °· Start counting kicks, flutters, swishes, rolls, or jabs in a 2-hour period. You should feel at least 10 movements within 2 hours. °· If you do not feel 10 movements in 2 hours, wait 2-3 hours and count again. Look for a change in the pattern or not enough counts in 2 hours. °SEEK MEDICAL CARE IF: °· You feel less than 10 counts in 2 hours, tried twice. °· There is no movement in over an hour. °· The pattern is changing or taking longer each day to reach 10 counts in 2 hours. °· You feel the baby is not moving as he or she usually does. °Date: ____________ Movements: ____________ Start time: ____________ Finish time: ____________  °Date: ____________ Movements: ____________ Start time: ____________ Finish time: ____________ °Date: ____________  Movements: ____________ Start time: ____________ Finish time: ____________ °Date: ____________ Movements: ____________ Start time: ____________ Finish time: ____________ °Date: ____________ Movements: ____________ Start time: ____________ Finish time: ____________ °Date: ____________ Movements: ____________ Start time: ____________ Finish time: ____________ °Date: ____________ Movements: ____________ Start time: ____________ Finish time: ____________ °Date: ____________ Movements: ____________ Start time: ____________ Finish time: ____________  °Date: ____________ Movements: ____________ Start time: ____________ Finish time: ____________ °Date: ____________ Movements: ____________ Start time: ____________ Finish time: ____________ °Date: ____________ Movements: ____________ Start time: ____________ Finish time: ____________ °Date: ____________ Movements: ____________ Start time: ____________ Finish time: ____________ °Date:   ____________ Movements: ____________ Start time: ____________ Finish time: ____________ °Date: ____________ Movements: ____________ Start time: ____________ Finish time: ____________ °Date: ____________ Movements: ____________ Start time: ____________ Finish time: ____________  °Date: ____________ Movements: ____________ Start time: ____________ Finish time: ____________ °Date: ____________ Movements: ____________ Start time: ____________ Finish time: ____________ °Date: ____________ Movements: ____________ Start time: ____________ Finish time: ____________ °Date: ____________ Movements: ____________ Start time: ____________ Finish time: ____________ °Date: ____________ Movements: ____________ Start time: ____________ Finish time: ____________ °Date: ____________ Movements: ____________ Start time: ____________ Finish time: ____________ °Date: ____________ Movements: ____________ Start time: ____________ Finish time: ____________  °Date: ____________ Movements: ____________ Start time:  ____________ Finish time: ____________ °Date: ____________ Movements: ____________ Start time: ____________ Finish time: ____________ °Date: ____________ Movements: ____________ Start time: ____________ Finish time: ____________ °Date: ____________ Movements: ____________ Start time: ____________ Finish time: ____________ °Date: ____________ Movements: ____________ Start time: ____________ Finish time: ____________ °Date: ____________ Movements: ____________ Start time: ____________ Finish time: ____________ °Date: ____________ Movements: ____________ Start time: ____________ Finish time: ____________  °Date: ____________ Movements: ____________ Start time: ____________ Finish time: ____________ °Date: ____________ Movements: ____________ Start time: ____________ Finish time: ____________ °Date: ____________ Movements: ____________ Start time: ____________ Finish time: ____________ °Date: ____________ Movements: ____________ Start time: ____________ Finish time: ____________ °Date: ____________ Movements: ____________ Start time: ____________ Finish time: ____________ °Date: ____________ Movements: ____________ Start time: ____________ Finish time: ____________ °Date: ____________ Movements: ____________ Start time: ____________ Finish time: ____________  °Date: ____________ Movements: ____________ Start time: ____________ Finish time: ____________ °Date: ____________ Movements: ____________ Start time: ____________ Finish time: ____________ °Date: ____________ Movements: ____________ Start time: ____________ Finish time: ____________ °Date: ____________ Movements: ____________ Start time: ____________ Finish time: ____________ °Date: ____________ Movements: ____________ Start time: ____________ Finish time: ____________ °Date: ____________ Movements: ____________ Start time: ____________ Finish time: ____________ °Date: ____________ Movements: ____________ Start time: ____________ Finish time: ____________  °Date:  ____________ Movements: ____________ Start time: ____________ Finish time: ____________ °Date: ____________ Movements: ____________ Start time: ____________ Finish time: ____________ °Date: ____________ Movements: ____________ Start time: ____________ Finish time: ____________ °Date: ____________ Movements: ____________ Start time: ____________ Finish time: ____________ °Date: ____________ Movements: ____________ Start time: ____________ Finish time: ____________ °Date: ____________ Movements: ____________ Start time: ____________ Finish time: ____________ °Date: ____________ Movements: ____________ Start time: ____________ Finish time: ____________  °Date: ____________ Movements: ____________ Start time: ____________ Finish time: ____________ °Date: ____________ Movements: ____________ Start time: ____________ Finish time: ____________ °Date: ____________ Movements: ____________ Start time: ____________ Finish time: ____________ °Date: ____________ Movements: ____________ Start time: ____________ Finish time: ____________ °Date: ____________ Movements: ____________ Start time: ____________ Finish time: ____________ °Date: ____________ Movements: ____________ Start time: ____________ Finish time: ____________ °  °This information is not intended to replace advice given to you by your health care provider. Make sure you discuss any questions you have with your health care provider. °  °Document Released: 11/21/2006 Document Revised: 11/12/2014 Document Reviewed: 08/18/2012 °[employer] Interactive Patient Education ©2016 [employer] °Braxton Hicks Contractions °Contractions of the uterus can occur throughout pregnancy. Contractions are not always a sign that you are in labor.  °WHAT ARE BRAXTON HICKS CONTRACTIONS?  °Contractions that occur before labor are called Braxton Hicks contractions, or false labor. Toward the end of pregnancy (32-34 weeks), these contractions can develop more often and may become more  forceful. This is not true labor because these contractions do not result in opening (dilatation) and thinning of the cervix. They are sometimes difficult to tell apart from true labor because these contractions can be forceful and people have different pain tolerances. You should   not feel embarrassed if you go to the hospital with false labor. Sometimes, the only way to tell if you are in true labor is for your health care provider to look for changes in the cervix. °If there are no prenatal problems or other health problems associated with the pregnancy, it is completely safe to be sent home with false labor and await the onset of true labor. °HOW CAN YOU TELL THE DIFFERENCE BETWEEN TRUE AND FALSE LABOR? °False Labor °· The contractions of false labor are usually shorter and not as hard as those of true labor.   °· The contractions are usually irregular.   °· The contractions are often felt in the front of the lower abdomen and in the groin.   °· The contractions may go away when you walk around or change positions while lying down.   °· The contractions get weaker and are shorter lasting as time goes on.   °· The contractions do not usually become progressively stronger, regular, and closer together as with true labor.   °True Labor °· Contractions in true labor last 30-70 seconds, become very regular, usually become more intense, and increase in frequency.   °· The contractions do not go away with walking.   °· The discomfort is usually felt in the top of the uterus and spreads to the lower abdomen and low back.   °· True labor can be determined by your health care provider with an exam. This will show that the cervix is dilating and getting thinner.   °WHAT TO REMEMBER °· Keep up with your usual exercises and follow other instructions given by your health care provider.   °· Take medicines as directed by your health care provider.   °· Keep your regular prenatal appointments.   °· Eat and drink lightly if you  think you are going into labor.   °· If Braxton Hicks contractions are making you uncomfortable:   °· Change your position from lying down or resting to walking, or from walking to resting.   °· Sit and rest in a tub of warm water.   °· Drink 2-3 glasses of water. Dehydration may cause these contractions.   °· Do slow and deep breathing several times an hour.   °WHEN SHOULD I SEEK IMMEDIATE MEDICAL CARE? °Seek immediate medical care if: °· Your contractions become stronger, more regular, and closer together.   °· You have fluid leaking or gushing from your vagina.   °· You have a fever.   °· You pass blood-tinged mucus.   °· You have vaginal bleeding.   °· You have continuous abdominal pain.   °· You have low back pain that you never had before.   °· You feel your baby's head pushing down and causing pelvic pressure.   °· Your baby is not moving as much as it used to.   °  °This information is not intended to replace advice given to you by your health care provider. Make sure you discuss any questions you have with your health care provider. °  °Document Released: 10/22/2005 Document Revised: 10/27/2013 Document Reviewed: 08/03/2013 °[employer] Interactive Patient Education ©2016 [employer] ° °

## 2015-11-03 ENCOUNTER — Encounter: Payer: Self-pay | Admitting: Advanced Practice Midwife

## 2015-11-03 ENCOUNTER — Ambulatory Visit (INDEPENDENT_AMBULATORY_CARE_PROVIDER_SITE_OTHER): Payer: BLUE CROSS/BLUE SHIELD | Admitting: Advanced Practice Midwife

## 2015-11-03 VITALS — BP 108/70 | HR 80 | Wt 181.0 lb

## 2015-11-03 DIAGNOSIS — Z3A38 38 weeks gestation of pregnancy: Secondary | ICD-10-CM

## 2015-11-03 DIAGNOSIS — Z3483 Encounter for supervision of other normal pregnancy, third trimester: Secondary | ICD-10-CM

## 2015-11-03 DIAGNOSIS — Z331 Pregnant state, incidental: Secondary | ICD-10-CM

## 2015-11-03 DIAGNOSIS — Z1389 Encounter for screening for other disorder: Secondary | ICD-10-CM

## 2015-11-03 DIAGNOSIS — Z3493 Encounter for supervision of normal pregnancy, unspecified, third trimester: Secondary | ICD-10-CM

## 2015-11-03 LAB — POCT URINALYSIS DIPSTICK
Blood, UA: NEGATIVE
Glucose, UA: NEGATIVE
Ketones, UA: NEGATIVE
Leukocytes, UA: NEGATIVE
Nitrite, UA: NEGATIVE

## 2015-11-03 NOTE — Progress Notes (Signed)
[id number] Estimated Date of Delivery: 11/14/15  Blood pressure 108/70, pulse 80, weight 181 lb (82.101 kg), last menstrual period 02/07/2015, unknown if currently breastfeeding.   BP weight and urine results all reviewed and noted.  Please refer to the obstetrical flow sheet for the fundal height and fetal heart rate documentation:  Patient reports good fetal movement, denies any bleeding and no rupture of membranes symptoms or regular contractions. Patient is without complaints. All questions were answered.  Orders Placed This Encounter  Procedures  . POCT urinalysis dipstick    Plan:  Continued routine obstetrical care,   Return in about 5 days (around 11/08/2015) for LROB.

## 2015-11-04 ENCOUNTER — Encounter (HOSPITAL_COMMUNITY): Payer: Self-pay | Admitting: *Deleted

## 2015-11-04 ENCOUNTER — Inpatient Hospital Stay (HOSPITAL_COMMUNITY)
Admission: AD | Admit: 2015-11-04 | Discharge: 2015-11-05 | Disposition: A | Discharge status: Home / Self Care | Payer: BLUE CROSS/BLUE SHIELD | Source: Ambulatory Visit | Attending: Obstetrics and Gynecology | Admitting: Obstetrics and Gynecology

## 2015-11-04 ENCOUNTER — Telehealth: Payer: Self-pay | Admitting: *Deleted

## 2015-11-04 DIAGNOSIS — J45909 Unspecified asthma, uncomplicated: Secondary | ICD-10-CM | POA: Insufficient documentation

## 2015-11-04 DIAGNOSIS — F419 Anxiety disorder, unspecified: Secondary | ICD-10-CM | POA: Diagnosis not present

## 2015-11-04 DIAGNOSIS — F319 Bipolar disorder, unspecified: Secondary | ICD-10-CM | POA: Diagnosis not present

## 2015-11-04 DIAGNOSIS — Z87891 Personal history of nicotine dependence: Secondary | ICD-10-CM | POA: Insufficient documentation

## 2015-11-04 DIAGNOSIS — Z3493 Encounter for supervision of normal pregnancy, unspecified, third trimester: Secondary | ICD-10-CM

## 2015-11-04 DIAGNOSIS — Z3A38 38 weeks gestation of pregnancy: Secondary | ICD-10-CM | POA: Insufficient documentation

## 2015-11-04 DIAGNOSIS — F429 Obsessive-compulsive disorder, unspecified: Secondary | ICD-10-CM | POA: Diagnosis not present

## 2015-11-04 LAB — POCT FERN TEST: POCT Fern Test: NEGATIVE

## 2015-11-04 NOTE — MAU Note (Signed)
Has had to change pad twice today with fld leaking. Unsure if is urine or what is leaking. No pain. Occ tightening in abd

## 2015-11-04 NOTE — Telephone Encounter (Signed)
Pt states her employer is requesting [facility] records from 10/11/2015 to support her beginning disability on 10/11/2015.

## 2015-11-04 NOTE — Discharge Instructions (Signed)
Fetal Movement Counts °Patient Name: __________________________________________________ Patient Due Date: ____________________ °Performing a fetal movement count is highly recommended in high-risk pregnancies, but it is good for every pregnant woman to do. Your health care provider may ask you to start counting fetal movements at 28 weeks of the pregnancy. Fetal movements often increase: °· After eating a full meal. °· After physical activity. °· After eating or drinking something sweet or cold. °· At rest. °Pay attention to when you feel the baby is most active. This will help you notice a pattern of your baby's sleep and wake cycles and what factors contribute to an increase in fetal movement. It is important to perform a fetal movement count at the same time each day when your baby is normally most active.  °HOW TO COUNT FETAL MOVEMENTS °1. Find a quiet and comfortable area to sit or lie down on your left side. Lying on your left side provides the best blood and oxygen circulation to your baby. °2. Write down the day and time on a sheet of paper or in a journal. °3. Start counting kicks, flutters, swishes, rolls, or jabs in a 2-hour period. You should feel at least 10 movements within 2 hours. °4. If you do not feel 10 movements in 2 hours, wait 2-3 hours and count again. Look for a change in the pattern or not enough counts in 2 hours. °SEEK MEDICAL CARE IF: °· You feel less than 10 counts in 2 hours, tried twice. °· There is no movement in over an hour. °· The pattern is changing or taking longer each day to reach 10 counts in 2 hours. °· You feel the baby is not moving as he or she usually does. °Date: ____________ Movements: ____________ Start time: ____________ Finish time: ____________  °Date: ____________ Movements: ____________ Start time: ____________ Finish time: ____________ °Date: ____________ Movements: ____________ Start time: ____________ Finish time: ____________ °Date: ____________ Movements:  ____________ Start time: ____________ Finish time: ____________ °Date: ____________ Movements: ____________ Start time: ____________ Finish time: ____________ °Date: ____________ Movements: ____________ Start time: ____________ Finish time: ____________ °Date: ____________ Movements: ____________ Start time: ____________ Finish time: ____________ °Date: ____________ Movements: ____________ Start time: ____________ Finish time: ____________  °Date: ____________ Movements: ____________ Start time: ____________ Finish time: ____________ °Date: ____________ Movements: ____________ Start time: ____________ Finish time: ____________ °Date: ____________ Movements: ____________ Start time: ____________ Finish time: ____________ °Date: ____________ Movements: ____________ Start time: ____________ Finish time: ____________ °Date: ____________ Movements: ____________ Start time: ____________ Finish time: ____________ °Date: ____________ Movements: ____________ Start time: ____________ Finish time: ____________ °Date: ____________ Movements: ____________ Start time: ____________ Finish time: ____________  °Date: ____________ Movements: ____________ Start time: ____________ Finish time: ____________ °Date: ____________ Movements: ____________ Start time: ____________ Finish time: ____________ °Date: ____________ Movements: ____________ Start time: ____________ Finish time: ____________ °Date: ____________ Movements: ____________ Start time: ____________ Finish time: ____________ °Date: ____________ Movements: ____________ Start time: ____________ Finish time: ____________ °Date: ____________ Movements: ____________ Start time: ____________ Finish time: ____________ °Date: ____________ Movements: ____________ Start time: ____________ Finish time: ____________  °Date: ____________ Movements: ____________ Start time: ____________ Finish time: ____________ °Date: ____________ Movements: ____________ Start time: ____________ Finish  time: ____________ °Date: ____________ Movements: ____________ Start time: ____________ Finish time: ____________ °Date: ____________ Movements: ____________ Start time: ____________ Finish time: ____________ °Date: ____________ Movements: ____________ Start time: ____________ Finish time: ____________ °Date: ____________ Movements: ____________ Start time: ____________ Finish time: ____________ °Date: ____________ Movements: ____________ Start time: ____________ Finish time: ____________  °Date: ____________ Movements: ____________ Start time: ____________ Finish   time: ____________ °Date: ____________ Movements: ____________ Start time: ____________ Finish time: ____________ °Date: ____________ Movements: ____________ Start time: ____________ Finish time: ____________ °Date: ____________ Movements: ____________ Start time: ____________ Finish time: ____________ °Date: ____________ Movements: ____________ Start time: ____________ Finish time: ____________ °Date: ____________ Movements: ____________ Start time: ____________ Finish time: ____________ °Date: ____________ Movements: ____________ Start time: ____________ Finish time: ____________  °Date: ____________ Movements: ____________ Start time: ____________ Finish time: ____________ °Date: ____________ Movements: ____________ Start time: ____________ Finish time: ____________ °Date: ____________ Movements: ____________ Start time: ____________ Finish time: ____________ °Date: ____________ Movements: ____________ Start time: ____________ Finish time: ____________ °Date: ____________ Movements: ____________ Start time: ____________ Finish time: ____________ °Date: ____________ Movements: ____________ Start time: ____________ Finish time: ____________ °Date: ____________ Movements: ____________ Start time: ____________ Finish time: ____________  °Date: ____________ Movements: ____________ Start time: ____________ Finish time: ____________ °Date: ____________  Movements: ____________ Start time: ____________ Finish time: ____________ °Date: ____________ Movements: ____________ Start time: ____________ Finish time: ____________ °Date: ____________ Movements: ____________ Start time: ____________ Finish time: ____________ °Date: ____________ Movements: ____________ Start time: ____________ Finish time: ____________ °Date: ____________ Movements: ____________ Start time: ____________ Finish time: ____________ °Date: ____________ Movements: ____________ Start time: ____________ Finish time: ____________  °Date: ____________ Movements: ____________ Start time: ____________ Finish time: ____________ °Date: ____________ Movements: ____________ Start time: ____________ Finish time: ____________ °Date: ____________ Movements: ____________ Start time: ____________ Finish time: ____________ °Date: ____________ Movements: ____________ Start time: ____________ Finish time: ____________ °Date: ____________ Movements: ____________ Start time: ____________ Finish time: ____________ °Date: ____________ Movements: ____________ Start time: ____________ Finish time: ____________ °  °This information is not intended to replace advice given to you by your health care provider. Make sure you discuss any questions you have with your health care provider. °  °Document Released: 11/21/2006 Document Revised: 11/12/2014 Document Reviewed: 08/18/2012 °[employer]vier Interactive Patient Education ©2016 [employer] °Third Trimester of Pregnancy °The third trimester is from week 29 through week 42, months 7 through 9. The third trimester is a time when the fetus is growing rapidly. At the end of the ninth month, the fetus is about 20 inches in length and weighs 6-10 pounds.  °BODY CHANGES °Your body goes through many changes during pregnancy. The changes vary from woman to woman.  °· Your weight will continue to increase. You can expect to gain 25-35 pounds (11-16 kg) by the end of the pregnancy. °· You may  begin to get stretch marks on your hips, abdomen, and breasts. °· You may urinate more often because the fetus is moving lower into your pelvis and pressing on your bladder. °· You may develop or continue to have heartburn as a result of your pregnancy. °· You may develop constipation because certain hormones are causing the muscles that push waste through your intestines to slow down. °· You may develop hemorrhoids or swollen, bulging veins (varicose veins). °· You may have pelvic pain because of the weight gain and pregnancy hormones relaxing your joints between the bones in your pelvis. Backaches may result from overexertion of the muscles supporting your posture. °· You may have changes in your hair. These can include thickening of your hair, rapid growth, and changes in texture. Some women also have hair loss during or after pregnancy, or hair that feels dry or thin. Your hair will most likely return to normal after your baby is born. °· Your breasts will continue to grow and be tender. A yellow discharge may leak from your breasts called colostrum. °· Your belly button may stick out. °·   You may feel short of breath because of your expanding uterus. °· You may notice the fetus "dropping," or moving lower in your abdomen. °· You may have a bloody mucus discharge. This usually occurs a few days to a week before labor begins. °· Your cervix becomes thin and soft (effaced) near your due date. °WHAT TO EXPECT AT YOUR PRENATAL EXAMS  °You will have prenatal exams every 2 weeks until week 36. Then, you will have weekly prenatal exams. During a routine prenatal visit: °5. You will be weighed to make sure you and the fetus are growing normally. °6. Your blood pressure is taken. °7. Your abdomen will be measured to track your baby's growth. °8. The fetal heartbeat will be listened to. °9. Any test results from the previous visit will be discussed. °10. You may have a cervical check near your due date to see if you have  effaced. °At around 36 weeks, your caregiver will check your cervix. At the same time, your caregiver will also perform a test on the secretions of the vaginal tissue. This test is to determine if a type of bacteria, Group B streptococcus, is present. Your caregiver will explain this further. °Your caregiver may ask you: °· What your birth plan is. °· How you are feeling. °· If you are feeling the baby move. °· If you have had any abnormal symptoms, such as leaking fluid, bleeding, severe headaches, or abdominal cramping. °· If you are using any tobacco products, including cigarettes, chewing tobacco, and electronic cigarettes. °· If you have any questions. °Other tests or screenings that may be performed during your third trimester include: °· Blood tests that check for low iron levels (anemia). °· Fetal testing to check the health, activity level, and growth of the fetus. Testing is done if you have certain medical conditions or if there are problems during the pregnancy. °· HIV (human immunodeficiency virus) testing. If you are at high risk, you may be screened for HIV during your third trimester of pregnancy. °FALSE LABOR °You may feel small, irregular contractions that eventually go away. These are called Braxton Hicks contractions, or false labor. Contractions may last for hours, days, or even weeks before true labor sets in. If contractions come at regular intervals, intensify, or become painful, it is best to be seen by your caregiver.  °SIGNS OF LABOR  °· Menstrual-like cramps. °· Contractions that are 5 minutes apart or less. °· Contractions that start on the top of the uterus and spread down to the lower abdomen and back. °· A sense of increased pelvic pressure or back pain. °· A watery or bloody mucus discharge that comes from the vagina. °If you have any of these signs before the 37th week of pregnancy, call your caregiver right away. You need to go to the hospital to get checked immediately. °HOME CARE  INSTRUCTIONS  °· Avoid all smoking, herbs, alcohol, and unprescribed drugs. These chemicals affect the formation and growth of the baby. °· Do not use any tobacco products, including cigarettes, chewing tobacco, and electronic cigarettes. If you need help quitting, ask your health care provider. You may receive counseling support and other resources to help you quit. °· Follow your caregiver's instructions regarding medicine use. There are medicines that are either safe or unsafe to take during pregnancy. °· Exercise only as directed by your caregiver. Experiencing uterine cramps is a good sign to stop exercising. °· Continue to eat regular, healthy meals. °· Wear a good support bra for   breast tenderness. °· Do not use hot tubs, steam rooms, or saunas. °· Wear your seat belt at all times when driving. °· Avoid raw meat, uncooked cheese, cat litter boxes, and soil used by cats. These carry germs that can cause birth defects in the baby. °· Take your prenatal vitamins. °· Take 1500-2000 mg of calcium daily starting at the 20th week of pregnancy until you deliver your baby. °· Try taking a stool softener (if your caregiver approves) if you develop constipation. Eat more high-fiber foods, such as fresh vegetables or fruit and whole grains. Drink plenty of fluids to keep your urine clear or pale yellow. °· Take warm sitz baths to soothe any pain or discomfort caused by hemorrhoids. Use hemorrhoid cream if your caregiver approves. °· If you develop varicose veins, wear support hose. Elevate your feet for 15 minutes, 3-4 times a day. Limit salt in your diet. °· Avoid heavy lifting, wear low heal shoes, and practice good posture. °· Rest a lot with your legs elevated if you have leg cramps or low back pain. °· Visit your dentist if you have not gone during your pregnancy. Use a soft toothbrush to brush your teeth and be gentle when you floss. °· A sexual relationship may be continued unless your caregiver directs you  otherwise. °· Do not travel far distances unless it is absolutely necessary and only with the approval of your caregiver. °· Take prenatal classes to understand, practice, and ask questions about the labor and delivery. °· Make a trial run to the hospital. °· Pack your hospital bag. °· Prepare the baby's nursery. °· Continue to go to all your prenatal visits as directed by your caregiver. °SEEK MEDICAL CARE IF: °· You are unsure if you are in labor or if your water has broken. °· You have dizziness. °· You have mild pelvic cramps, pelvic pressure, or nagging pain in your abdominal area. °· You have persistent nausea, vomiting, or diarrhea. °· You have a bad smelling vaginal discharge. °· You have pain with urination. °SEEK IMMEDIATE MEDICAL CARE IF:  °· You have a fever. °· You are leaking fluid from your vagina. °· You have spotting or bleeding from your vagina. °· You have severe abdominal cramping or pain. °· You have rapid weight loss or gain. °· You have shortness of breath with chest pain. °· You notice sudden or extreme swelling of your face, hands, ankles, feet, or legs. °· You have not felt your baby move in over an hour. °· You have severe headaches that do not go away with medicine. °· You have vision changes. °  °This information is not intended to replace advice given to you by your health care provider. Make sure you discuss any questions you have with your health care provider. °  °Document Released: 10/16/2001 Document Revised: 11/12/2014 Document Reviewed: 12/23/2012 °Elsevier Interactive Patient Education ©2016 [employer] ° °

## 2015-11-05 ENCOUNTER — Inpatient Hospital Stay (EMERGENCY_DEPARTMENT_HOSPITAL)
Admission: EM | Admit: 2015-11-05 | Discharge: 2015-11-05 | Disposition: A | Discharge status: Home / Self Care | Payer: BLUE CROSS/BLUE SHIELD | Source: Ambulatory Visit | Attending: Obstetrics & Gynecology | Admitting: Obstetrics & Gynecology

## 2015-11-05 ENCOUNTER — Encounter (HOSPITAL_COMMUNITY): Payer: Self-pay | Admitting: *Deleted

## 2015-11-05 DIAGNOSIS — N898 Other specified noninflammatory disorders of vagina: Secondary | ICD-10-CM | POA: Diagnosis not present

## 2015-11-05 DIAGNOSIS — O26893 Other specified pregnancy related conditions, third trimester: Secondary | ICD-10-CM

## 2015-11-05 DIAGNOSIS — O471 False labor at or after 37 completed weeks of gestation: Secondary | ICD-10-CM

## 2015-11-05 DIAGNOSIS — O479 False labor, unspecified: Secondary | ICD-10-CM

## 2015-11-05 LAB — AMNISURE RUPTURE OF MEMBRANE (ROM) NOT AT ARMC: Amnisure ROM: NEGATIVE

## 2015-11-05 LAB — POCT FERN TEST: POCT Fern Test: NEGATIVE

## 2015-11-05 MED ORDER — ZOLPIDEM TARTRATE 5 MG PO TABS: 5.0000 mg | ORAL_TABLET | Freq: Every evening | ORAL | Status: DC | PRN

## 2015-11-05 NOTE — Discharge Instructions (Signed)
Come to the MAU (maternity admission unit) for 1) Strong contractions every 5-7 minutes for at least 1 hour that do not go away when you drink water or take a warm shower. These contractions will be so strong all you can do is breath through them 2) Vaginal bleeding- anything more than spotting 3) Loss of fluid like you broke your water 4) Decreased movement of your baby

## 2015-11-05 NOTE — MAU Provider Note (Signed)
History     CSN: [id number] Arrival date and time: 11/05/15 [id number] First Provider Initiated Contact with Patient 11/05/15 1934      Chief Complaint  Patient presents with  . Rupture of Membranes   HPI  Patient is 24 y.o. [id number] here with complaints of ROM. She reports she was out all day walking and started having contractions about every 10 minutes. She then returned home and had intercourse. She reports she is not sure "if she broke her water or if she just had an orgasm" but reports fluid started leaking. She feels pressure, especially with her contractions.   +FM, denies LOF, VB, vaginal discharge.   OB History    Gravida Para Term Preterm AB TAB SAB Ectopic Multiple Living   4 2 2  0 1 0 1 0 0 2      Past Medical History  Diagnosis Date  . Asthma   . Anxiety   . Cervix prolapsed into vagina   . Depression   . OCD (obsessive compulsive disorder)   . Bipolar disorder (HCC)   . Supervision of normal pregnancy in second trimester 05/11/2015     [facility] Initiated Care at   05/11/15 FOB Kerney Elbehristopher Fanton 24 yo Dating By  LMP and US Pap  05/11/15 GC/CT Initial:                36+wks: Genetic Screen NT/IT:  CF screen  Anatomic US  Flu vaccine  Tdap Recommended ~ 28wks Glucose Screen  2 hr GBS  Feed Preference  Contraception  Circumcision  Childbirth Classes  Pediatrician      Past Surgical History  Procedure Laterality Date  . None    . Colonoscopy N/A 11/12/2013    Procedure: COLONOSCOPY;  Surgeon: Corbin Adeobert M Rourk, MD;  Location: [facility];  Service: Endoscopy;  Laterality: N/A;  2:40  . Dilation and curettage of uterus      Family History  Problem Relation Age of Onset  . Colon polyps Maternal Grandmother   . Depression Maternal Grandmother   . Colon cancer       maternal great grandmother  . Depression Mother   . Hypertension Mother   . Stroke Brother     while in womb  . Seizures Brother   . Asthma Son     Social History  Substance Use Topics  .  Smoking status: Former Smoker    Types: Cigarettes  . Smokeless tobacco: Never Used  . Alcohol Use: No    Allergies: No Known Allergies  Prescriptions prior to admission  Medication Sig Dispense Refill Last Dose  . albuterol (PROVENTIL HFA;VENTOLIN HFA) 108 (90 BASE) MCG/ACT inhaler Inhale 2 puffs into the lungs every 6 (six) hours as needed for wheezing or shortness of breath. Reported on 11/03/2015   Not Taking  . calcium carbonate (TUMS - DOSED IN MG ELEMENTAL CALCIUM) 500 MG chewable tablet Chew 2 tablets by mouth 3 (three) times daily as needed for indigestion or heartburn.    Taking  . hydrocortisone 2.5 % lotion Apply 1 application topically 2 (two) times daily as needed (for itching).   Taking  . Prenatal Vit-Fe Fumarate-FA (PRENATAL MULTIVITAMIN) TABS tablet Take 1 tablet by mouth daily.   Taking    Review of Systems  Constitutional: Negative for fever and chills.  Eyes: Negative for blurred vision and double vision.  Respiratory: Negative for cough and shortness of breath.   Cardiovascular: Negative for chest pain and orthopnea.  Gastrointestinal:  Negative for nausea and vomiting.  Genitourinary: Negative for dysuria, frequency and flank pain.  Musculoskeletal: Negative for myalgias.  Skin: Negative for rash.  Neurological: Negative for dizziness, tingling, weakness and headaches.  Endo/Heme/Allergies: Does not bruise/bleed easily.  Psychiatric/Behavioral: Negative for depression and suicidal ideas. The patient is not nervous/anxious.    Physical Exam   Last menstrual period 02/07/2015, unknown if currently breastfeeding.  Physical Exam  Nursing note and vitals reviewed. Constitutional: She is oriented to person, place, and time. She appears well-developed and well-nourished. No distress.  Pregnant female  HENT:  Head: Normocephalic and atraumatic.  Eyes: Conjunctivae are normal. No scleral icterus.  Neck: Normal range of motion. Neck supple.  Cardiovascular: Normal  rate and intact distal pulses.   Respiratory: Effort normal. She exhibits no tenderness.  GI: Soft. There is no tenderness. There is no rebound and no guarding.  Gravid  Genitourinary: Vagina normal.  Thick mucoid discharge  Musculoskeletal: Normal range of motion. She exhibits no edema.  Neurological: She is alert and oriented to person, place, and time.  Skin: Skin is warm and dry. No rash noted.  Psychiatric: She has a normal mood and affect.   Initial exam- 19:28 Dilation: 3.5 Effacement (%): 50 Station: -3 Presentation: Vertex Exam by:: DR Timberlynn Kizziah  Repeat Exam-20:30 Dilation: 3.5 Effacement (%): 50 Cervical Position: Middle Station: -3 Presentation: Vertex Exam by:: Dr. Alvester Morin   MAU Course  Procedures  MDM Crist Fat Test- NEGATIVE Amnisure- NEGATIVE  NST 135/mod/+accels, no decels Toco: q10-68min  Assessment and Plan  Camyla Camposano Nesby is a 24 y.o. [id number] at [redacted]w[redacted]d presenting for ROM with contractions.  Testing shows patient is unlikely to have have had ROM and contractions are currently > 10 minutes apart and while they are painful they are not frequent.  Additionally cervix is roughly unchanged from prior exams and did not change over 1 hour of walking.   - home with labor precautions in detail - has follow up on Tuesday Jan 3rd  Federico Flake 11/05/2015, 8:03 PM

## 2015-11-05 NOTE — MAU Note (Signed)
Pt states she had intercourse today, later had gush of clear fluid around 1245.  Having occasional uc's, denies bleeding.  Third baby, seen in MAU last night, 4 cm's.

## 2015-11-06 NOTE — Telephone Encounter (Signed)
Can this be handled by front office??

## 2015-11-06 NOTE — L&D Delivery Note (Cosign Needed)
Delivery Note  Patient presented after inadvertent AROM in clinic the day of admission in the setting of membrane sweeping. She was augmented with pitocin and progressed rapidly shortly after start of pitocin. 2nd stage was precipitous.  At 3:53 PM a viable female was delivered via Vaginal, Spontaneous Delivery (Presentation: Left Occiput Anterior).  APGAR: 9, 9; weight 2925 g.   Placenta status: Intact, Spontaneous.  Cord: 3 vessels with the following complications: uterus manually swept for trailing membrane, which was removed.  Cord pH: not obtained.  Anesthesia: Epidural  Episiotomy: None Lacerations:  none Est. Blood Loss (mL):  100  Mom to postpartum.  Baby to Couplet care / Skin to Skin.  Katie Briggs 11/08/2015, 4:17 PM

## 2015-11-08 ENCOUNTER — Ambulatory Visit (INDEPENDENT_AMBULATORY_CARE_PROVIDER_SITE_OTHER): Payer: BLUE CROSS/BLUE SHIELD | Admitting: Obstetrics & Gynecology

## 2015-11-08 ENCOUNTER — Inpatient Hospital Stay (HOSPITAL_COMMUNITY)
Admission: AD | Admit: 2015-11-08 | Discharge: 2015-11-10 | DRG: 767 | Disposition: A | Discharge status: Home / Self Care | Payer: BLUE CROSS/BLUE SHIELD | Source: Ambulatory Visit | Attending: Obstetrics & Gynecology | Admitting: Obstetrics & Gynecology

## 2015-11-08 ENCOUNTER — Encounter (HOSPITAL_COMMUNITY): Payer: Self-pay | Admitting: *Deleted

## 2015-11-08 ENCOUNTER — Encounter: Payer: Self-pay | Admitting: Obstetrics & Gynecology

## 2015-11-08 ENCOUNTER — Inpatient Hospital Stay (HOSPITAL_COMMUNITY): Payer: BLUE CROSS/BLUE SHIELD | Admitting: Anesthesiology

## 2015-11-08 VITALS — BP 104/60 | HR 90 | Wt 180.0 lb

## 2015-11-08 DIAGNOSIS — Z302 Encounter for sterilization: Secondary | ICD-10-CM

## 2015-11-08 DIAGNOSIS — Z87891 Personal history of nicotine dependence: Secondary | ICD-10-CM

## 2015-11-08 DIAGNOSIS — O4290 Premature rupture of membranes, unspecified as to length of time between rupture and onset of labor, unspecified weeks of gestation: Secondary | ICD-10-CM | POA: Diagnosis present

## 2015-11-08 DIAGNOSIS — Z1389 Encounter for screening for other disorder: Secondary | ICD-10-CM

## 2015-11-08 DIAGNOSIS — Z3493 Encounter for supervision of normal pregnancy, unspecified, third trimester: Secondary | ICD-10-CM

## 2015-11-08 DIAGNOSIS — Z3A39 39 weeks gestation of pregnancy: Secondary | ICD-10-CM

## 2015-11-08 DIAGNOSIS — Z3483 Encounter for supervision of other normal pregnancy, third trimester: Secondary | ICD-10-CM | POA: Diagnosis not present

## 2015-11-08 DIAGNOSIS — R51 Headache: Secondary | ICD-10-CM | POA: Diagnosis present

## 2015-11-08 DIAGNOSIS — Z331 Pregnant state, incidental: Secondary | ICD-10-CM

## 2015-11-08 DIAGNOSIS — O4292 Full-term premature rupture of membranes, unspecified as to length of time between rupture and onset of labor: Principal | ICD-10-CM | POA: Diagnosis present

## 2015-11-08 HISTORY — DX: Personal history of nicotine dependence: Z87.891

## 2015-11-08 LAB — POCT URINALYSIS DIPSTICK
Blood, UA: NEGATIVE
Glucose, UA: NEGATIVE
Leukocytes, UA: NEGATIVE
Nitrite, UA: NEGATIVE

## 2015-11-08 LAB — CBC
HCT: 36.7 % (ref 36.0–46.0)
Hemoglobin: 12.1 g/dL (ref 12.0–15.0)
MCH: 27.8 pg (ref 26.0–34.0)
MCHC: 33 g/dL (ref 30.0–36.0)
MCV: 84.4 fL (ref 78.0–100.0)
Platelets: 223 10*3/uL (ref 150–400)
RBC: 4.35 MIL/uL (ref 3.87–5.11)
RDW: 14.5 % (ref 11.5–15.5)
WBC: 9.9 10*3/uL (ref 4.0–10.5)

## 2015-11-08 LAB — TYPE AND SCREEN
ABO/RH(D): O POS
Antibody Screen: NEGATIVE

## 2015-11-08 LAB — ABO/RH: ABO/RH(D): O POS

## 2015-11-08 MED ORDER — ONDANSETRON HCL 4 MG PO TABS: 4.0000 mg | ORAL_TABLET | ORAL | Status: DC | PRN

## 2015-11-08 MED ORDER — OXYTOCIN BOLUS FROM INFUSION: 500.0000 mL | INTRAVENOUS | Status: DC

## 2015-11-08 MED ORDER — ACETAMINOPHEN 325 MG PO TABS
650.0000 mg | ORAL_TABLET | ORAL | Status: DC | PRN
  Filled 2015-11-08: qty 2

## 2015-11-08 MED ORDER — ONDANSETRON HCL 4 MG/2ML IJ SOLN: 4.0000 mg | Freq: Four times a day (QID) | INTRAMUSCULAR | Status: DC | PRN

## 2015-11-08 MED ORDER — ONDANSETRON HCL 4 MG/2ML IJ SOLN: 4.0000 mg | INTRAMUSCULAR | Status: DC | PRN

## 2015-11-08 MED ORDER — LANOLIN HYDROUS EX OINT: TOPICAL_OINTMENT | CUTANEOUS | Status: DC | PRN

## 2015-11-08 MED ORDER — ACETAMINOPHEN 325 MG PO TABS
650.0000 mg | ORAL_TABLET | ORAL | Status: DC | PRN
  Administered 2015-11-08: 650 mg via ORAL

## 2015-11-08 MED ORDER — CITRIC ACID-SODIUM CITRATE 334-500 MG/5ML PO SOLN: 30.0000 mL | ORAL | Status: DC | PRN

## 2015-11-08 MED ORDER — SIMETHICONE 80 MG PO CHEW: 80.0000 mg | CHEWABLE_TABLET | ORAL | Status: DC | PRN

## 2015-11-08 MED ORDER — BENZOCAINE-MENTHOL 20-0.5 % EX AERO: 1.0000 "application " | INHALATION_SPRAY | CUTANEOUS | Status: DC | PRN

## 2015-11-08 MED ORDER — FENTANYL 2.5 MCG/ML BUPIVACAINE 1/10 % EPIDURAL INFUSION (WH - ANES)
14.0000 mL/h | INTRAMUSCULAR | Status: DC | PRN
Start: 2015-11-08 — End: 2015-11-08
  Administered 2015-11-08: 11 mL/h via EPIDURAL
  Administered 2015-11-08: 14 mL/h via EPIDURAL
  Filled 2015-11-08: qty 125

## 2015-11-08 MED ORDER — PHENYLEPHRINE 40 MCG/ML (10ML) SYRINGE FOR IV PUSH (FOR BLOOD PRESSURE SUPPORT)
PREFILLED_SYRINGE | INTRAVENOUS | Status: AC
  Filled 2015-11-08: qty 20

## 2015-11-08 MED ORDER — PRENATAL MULTIVITAMIN CH
1.0000 | ORAL_TABLET | Freq: Every day | ORAL | Status: DC
  Administered 2015-11-09 – 2015-11-10 (×2): 1 via ORAL
  Filled 2015-11-08 (×2): qty 1

## 2015-11-08 MED ORDER — MEASLES, MUMPS & RUBELLA VAC ~~LOC~~ INJ: 0.5000 mL | INJECTION | Freq: Once | SUBCUTANEOUS | Status: DC

## 2015-11-08 MED ORDER — LIDOCAINE HCL (PF) 1 % IJ SOLN
INTRAMUSCULAR | Status: DC | PRN
  Administered 2015-11-08: 3 mL
  Administered 2015-11-08: 5 mL via EPIDURAL

## 2015-11-08 MED ORDER — IBUPROFEN 600 MG PO TABS
600.0000 mg | ORAL_TABLET | Freq: Four times a day (QID) | ORAL | Status: DC
  Administered 2015-11-08 – 2015-11-10 (×8): 600 mg via ORAL
  Filled 2015-11-08 (×8): qty 1

## 2015-11-08 MED ORDER — DIPHENHYDRAMINE HCL 50 MG/ML IJ SOLN: 12.5000 mg | INTRAMUSCULAR | Status: DC | PRN

## 2015-11-08 MED ORDER — LACTATED RINGERS IV SOLN
500.0000 mL | INTRAVENOUS | Status: DC | PRN
  Administered 2015-11-08 (×2): 500 mL via INTRAVENOUS

## 2015-11-08 MED ORDER — EPHEDRINE 5 MG/ML INJ
10.0000 mg | INTRAVENOUS | Status: DC | PRN
  Filled 2015-11-08: qty 4
  Filled 2015-11-08: qty 2

## 2015-11-08 MED ORDER — DIBUCAINE 1 % RE OINT: 1.0000 "application " | TOPICAL_OINTMENT | RECTAL | Status: DC | PRN

## 2015-11-08 MED ORDER — SENNOSIDES-DOCUSATE SODIUM 8.6-50 MG PO TABS
2.0000 | ORAL_TABLET | ORAL | Status: DC
  Administered 2015-11-08 – 2015-11-09 (×2): 2 via ORAL
  Filled 2015-11-08 (×2): qty 2

## 2015-11-08 MED ORDER — OXYTOCIN 10 UNIT/ML IJ SOLN
1.0000 m[IU]/min | INTRAVENOUS | Status: DC
  Administered 2015-11-08: 2 m[IU]/min via INTRAVENOUS
  Filled 2015-11-08: qty 4

## 2015-11-08 MED ORDER — LACTATED RINGERS IV SOLN
INTRAVENOUS | Status: DC
  Administered 2015-11-08 (×2): via INTRAVENOUS

## 2015-11-08 MED ORDER — ZOLPIDEM TARTRATE 5 MG PO TABS: 5.0000 mg | ORAL_TABLET | Freq: Every evening | ORAL | Status: DC | PRN

## 2015-11-08 MED ORDER — WITCH HAZEL-GLYCERIN EX PADS: 1.0000 "application " | MEDICATED_PAD | CUTANEOUS | Status: DC | PRN

## 2015-11-08 MED ORDER — TERBUTALINE SULFATE 1 MG/ML IJ SOLN
0.2500 mg | Freq: Once | INTRAMUSCULAR | Status: DC | PRN
  Filled 2015-11-08: qty 1

## 2015-11-08 MED ORDER — DIPHENHYDRAMINE HCL 25 MG PO CAPS: 25.0000 mg | ORAL_CAPSULE | Freq: Four times a day (QID) | ORAL | Status: DC | PRN

## 2015-11-08 MED ORDER — PHENYLEPHRINE 40 MCG/ML (10ML) SYRINGE FOR IV PUSH (FOR BLOOD PRESSURE SUPPORT)
80.0000 ug | PREFILLED_SYRINGE | INTRAVENOUS | Status: DC | PRN
  Filled 2015-11-08: qty 2

## 2015-11-08 MED ORDER — PHENYLEPHRINE 40 MCG/ML (10ML) SYRINGE FOR IV PUSH (FOR BLOOD PRESSURE SUPPORT)
80.0000 ug | PREFILLED_SYRINGE | INTRAVENOUS | Status: AC | PRN
  Administered 2015-11-08 (×7): 80 ug via INTRAVENOUS
  Filled 2015-11-08: qty 20

## 2015-11-08 MED ORDER — TETANUS-DIPHTH-ACELL PERTUSSIS 5-2.5-18.5 LF-MCG/0.5 IM SUSP: 0.5000 mL | Freq: Once | INTRAMUSCULAR | Status: DC

## 2015-11-08 MED ORDER — OXYTOCIN 40 UNITS IN LACTATED RINGERS INFUSION - SIMPLE MED: 2.5000 [IU]/h | INTRAVENOUS | Status: DC

## 2015-11-08 MED ORDER — LIDOCAINE HCL (PF) 1 % IJ SOLN
30.0000 mL | INTRAMUSCULAR | Status: DC | PRN
  Filled 2015-11-08: qty 30

## 2015-11-08 NOTE — Anesthesia Procedure Notes (Addendum)
Epidural Patient location during procedure: OB  Staffing Anesthesiologist: Cristela BlueJACKSON, Morgyn Marut Other anesthesia staff: CREWS, DAVID Performed by: anesthesiologist and other anesthesia staff   Preanesthetic Checklist Completed: patient identified, site marked, surgical consent, pre-op evaluation, timeout performed, IV checked, risks and benefits discussed and monitors and equipment checked  Epidural Patient position: sitting Prep: site prepped and draped and DuraPrep Patient monitoring: continuous pulse ox and blood pressure Approach: midline Location: L2-L3 Injection technique: LOR air  Needle:  Needle type: Tuohy  Needle gauge: 17 G Needle length: 9 cm and 9 Needle insertion depth: 5 cm cm Catheter type: closed end flexible Catheter size: 19 Gauge Catheter at skin depth: 10 cm Test dose: negative  Assessment Events: blood not aspirated, injection not painful, no injection resistance, negative IV test and no paresthesia  Additional Notes Moved to 1 higher interspace than Wet Tap  Dosing of Epidural:  1st dose, through catheter ............................................Marland Kitchen. Xylocaine 30 mg  2nd dose, through catheter, after waiting 3 minutes.......Marland Kitchen.Xylocaine 50 mg     As each dose occurred, patient was free of IV sx; and patient exhibited no evidence of SA injection.  Patient's CTX more comfortable after epidural dosed. Please see RN's note for documentation of vital signs,and FHR which are stable.  Patient reminded not to try to ambulate with numb legs, and that an RN must be present the 1st time she attempts to get up. Pt has HA at end of procedure.

## 2015-11-08 NOTE — Progress Notes (Signed)
[id number] 866w1d Estimated Date of Delivery: 11/14/15  Blood pressure 104/60, pulse 90, weight 180 lb (81.647 kg), last menstrual period 02/07/2015, unknown if currently breastfeeding.   BP weight and urine results all reviewed and noted.  Please refer to the obstetrical flow sheet for the fundal height and fetal heart rate documentation:  Patient reports good fetal movement, denies any bleeding and no rupture of membranes symptoms or regular contractions. Patient is without complaints. All questions were answered.  Orders Placed This Encounter  Procedures  . POCT urinalysis dipstick    Plan:  Continued routine obstetrical care,   Was stripping her membranes and her membranes ruptured  No Follow-up on file.

## 2015-11-08 NOTE — H&P (Signed)
LABOR AND DELIVERY ADMISSION HISTORY AND PHYSICAL NOTE  Zonia KiefMalisa R Mones is a 25 y.o. female [phone] with IUP at 5964w1d by L/11 presenting for inadvertent AROM in clinic today. Presented for routine prenatal, membranes stripped, and in process of membrane stripping AROM clear occurred. This at approximately 09:30 today. Currently mild low back pain, no bleeding, otherwise no contractions.   She reports positive fetal movement.   Prenatal History/Complications:  Past Medical History: Past Medical History  Diagnosis Date  . Asthma   . Anxiety   . Cervix prolapsed into vagina   . Depression   . OCD (obsessive compulsive disorder)   . Bipolar disorder (HCC)   . Supervision of normal pregnancy in second trimester 05/11/2015     [facility] Initiated Care at   05/11/15 FOB Kerney Elbehristopher Gangl 25 yo Dating By  LMP and US Pap  05/11/15 GC/CT Initial:                36+wks: Genetic Screen NT/IT:  CF screen  Anatomic US  Flu vaccine  Tdap Recommended ~ 28wks Glucose Screen  2 hr GBS  Feed Preference  Contraception  Circumcision  Childbirth Classes  Pediatrician      Past Surgical History: Past Surgical History  Procedure Laterality Date  . None    . Colonoscopy N/A 11/12/2013    Procedure: COLONOSCOPY;  Surgeon: Corbin Adeobert M Rourk, MD;  Location: [facility];  Service: Endoscopy;  Laterality: N/A;  2:40  . Dilation and curettage of uterus      Obstetrical History: OB History    Gravida Para Term Preterm AB TAB SAB Ectopic Multiple Living   4 2 2  0 1 0 1 0 0 2      Social History: Social History   Social History  . Marital Status: Married    Spouse Name: N/A  . Number of Children: 2  . Years of Education: N/A   Social History Main Topics  . Smoking status: Former Smoker    Types: Cigarettes  . Smokeless tobacco: Never Used  . Alcohol Use: No  . Drug Use: No  . Sexual Activity: Not Currently    Birth Control/ Protection: None   Other Topics Concern  . None   Social History  Narrative    Family History: Family History  Problem Relation Age of Onset  . Colon polyps Maternal Grandmother   . Depression Maternal Grandmother   . Colon cancer       maternal great grandmother  . Depression Mother   . Hypertension Mother   . Stroke Brother     while in womb  . Seizures Brother   . Asthma Son     Allergies: No Known Allergies  Prescriptions prior to admission  Medication Sig Dispense Refill Last Dose  . albuterol (PROVENTIL HFA;VENTOLIN HFA) 108 (90 BASE) MCG/ACT inhaler Inhale 2 puffs into the lungs every 6 (six) hours as needed for wheezing or shortness of breath. Reported on 11/08/2015   Not Taking  . calcium carbonate (TUMS - DOSED IN MG ELEMENTAL CALCIUM) 500 MG chewable tablet Chew 2 tablets by mouth 3 (three) times daily as needed for indigestion or heartburn.    Taking  . hydrocortisone 2.5 % lotion Apply 1 application topically 2 (two) times daily as needed (for itching).   Taking  . Prenatal Vit-Fe Fumarate-FA (PRENATAL MULTIVITAMIN) TABS tablet Take 1 tablet by mouth daily.   Taking  . zolpidem (AMBIEN) 5 MG tablet Take 1 tablet (5 mg  total) by mouth at bedtime as needed for sleep. 5 tablet 1 Taking     Review of Systems   All systems reviewed and negative except as stated in HPI  Last menstrual period 02/07/2015, unknown if currently breastfeeding. General appearance: alert, cooperative and appears stated age Lungs: clear to auscultation bilaterally Heart: regular rate and rhythm Abdomen: soft, non-tender; bowel sounds normal Extremities: No calf swelling or tenderness Presentation: cephalic Fetal monitoring: 145/mod/+a/-d Uterine activity: quiet      Prenatal labs: ABO, Rh: O/Positive/-- (07/06 1543) Antibody: Negative (10/06 0918) Rubella: !Error!immune RPR: Non Reactive (10/06 0918)  HBsAg: Negative (07/06 1543)  HIV: Non Reactive (10/06 0918)  GBS: Negative (12/15 1400)  2 hr Glucola: 70/113/92 Genetic screening:  Normal  First/NT Anatomy US: wnl  Prenatal Transfer Tool  Maternal Diabetes: No Genetic Screening: Normal Maternal Ultrasounds/Referrals: Normal Fetal Ultrasounds or other Referrals:  None Maternal Substance Abuse:  No Significant Maternal Medications:  None Significant Maternal Lab Results: Lab values include: Group B Strep negative   Results for orders placed or performed in visit on 11/08/15 (from the past 24 hour(s))  POCT urinalysis dipstick   Collection Time: 11/08/15  9:17 AM  Result Value Ref Range   Color, UA     Clarity, UA     Glucose, UA neg    Bilirubin, UA     Ketones, UA small    Spec Grav, UA     Blood, UA neg    pH, UA     Protein, UA trace    Urobilinogen, UA     Nitrite, UA neg    Leukocytes, UA Negative Negative    Patient Active Problem List   Diagnosis Date Noted  . Premature rupture of membranes 11/08/2015  . Supervision of normal pregnancy 05/11/2015  . Rectal bleeding 10/28/2013  . Unspecified constipation 10/28/2013  . Obsessive compulsive disorder 03/10/2012  . Bipolar affective disorder, depressed, severe (HCC) 03/06/2012    Class: Acute    Assessment: EMON MIGGINS is a 25 y.o. [id number] at [redacted]w[redacted]d here for IOL 2/2 inadvertent AROM in clinic this morning.  #Labor: cervix favorable, will start pitocin #Pain: Eventual epidural #FWB: Cat 1 #ID:  gbs neg pcr, will need ppx if develops fever or ROM > 18 hours #MOF:  breast #MOC: interval b/l salpingectomy  #Circ:  N/a #OCD/bipolar: not on meds, doesn't have a therapist. Reports good control of symptoms this pregnancy. Have [facility] in [facility] to 6 week PP visit.  Cherrie Gauze Angellica Maddison 11/08/2015, 11:25 AM

## 2015-11-08 NOTE — Lactation Note (Signed)
This note was copied from the chart of Katie Briggs Mutz. Lactation Consultation Note  Patient Name: Katie Briggs Rugg MVHQI'OToday's Date: 11/08/2015 Reason for consult: Initial assessment Baby at 5 hr of life and reports baby is latching well. She did not bf her oldest, tried for a month to bf her 2nd but had trouble with latching. She stated that baby is doing a lot of off and on the breast but is doing better than her other child. Discussed baby behavior, feeding frequency, baby belly size, voids, wt loss, breast changes, and nipple care. Mom stated that she can manually express and has seen colostrum bilaterally. Given lactation handouts. Aware of OP services and support group.    Maternal Data Has patient been taught Hand Expression?: Yes Does the patient have breastfeeding experience prior to this delivery?: Yes  Feeding Feeding Type: Breast Fed Length of feed: 30 min  LATCH Score/Interventions Latch: Grasps breast easily, tongue down, lips flanged, rhythmical sucking.  Audible Swallowing: A few with stimulation  Type of Nipple: Everted at rest and after stimulation  Comfort (Breast/Nipple): Soft / non-tender     Hold (Positioning): No assistance needed to correctly position infant at breast.  LATCH Score: 9  Lactation Tools Discussed/Used WIC Program: Yes   Consult Status Consult Status: Follow-up Date: 11/09/15 Follow-up type: In-patient    Rulon Eisenmengerlizabeth E Quinta Eimer 11/08/2015, 9:46 PM

## 2015-11-08 NOTE — Anesthesia Preprocedure Evaluation (Signed)

## 2015-11-09 ENCOUNTER — Inpatient Hospital Stay (HOSPITAL_COMMUNITY): Payer: BLUE CROSS/BLUE SHIELD | Admitting: Anesthesiology

## 2015-11-09 ENCOUNTER — Encounter (HOSPITAL_COMMUNITY): Payer: Self-pay | Admitting: *Deleted

## 2015-11-09 LAB — RPR: RPR Ser Ql: NONREACTIVE

## 2015-11-09 MED ORDER — TRAMADOL HCL 50 MG PO TABS
50.0000 mg | ORAL_TABLET | Freq: Four times a day (QID) | ORAL | Status: DC | PRN
  Administered 2015-11-09: 50 mg via ORAL
  Filled 2015-11-09: qty 1

## 2015-11-09 MED ORDER — FENTANYL CITRATE (PF) 100 MCG/2ML IJ SOLN: 50.0000 ug | Freq: Once | INTRAMUSCULAR | Status: DC | PRN

## 2015-11-09 NOTE — OR Nursing (Signed)
Pt states headache "already starting to feel better also states her back is feeling better. Dr. Cristela BlueKyle Jackson comes back to see patient and states patient may return to her room. Dr, Cristela BlueKyle Jackson stated only one set of vital signs needed after procedure.  States patient may get up ad lib when back in room.  No additional orders given post procedure. Margarita Mailoni Dorris Vangorder rn

## 2015-11-09 NOTE — Progress Notes (Signed)
Pt c/o headache that is radiating to the back of her neck and down her spine. MD called and notified of pt's complaint, new orders received and noted.

## 2015-11-09 NOTE — Anesthesia Procedure Notes (Signed)
Epidural Patient location during procedure: post-op  Staffing Anesthesiologist: Cristela BlueJACKSON, Katie Briggs  Preanesthetic Checklist Completed: patient identified, site marked, surgical consent, pre-op evaluation, timeout performed, IV checked, risks and benefits discussed and monitors and equipment checked  Epidural Patient position: sitting Prep: site prepped and draped and DuraPrep Patient monitoring: continuous pulse ox and blood pressure Approach: midline Location: L3-L4 Injection technique: LOR air  Needle:  Needle type: Tuohy  Needle gauge: 17 G Needle length: 9 cm and 9 Needle insertion depth: 5 cm cm Catheter type: closed end flexible Catheter size: 19 Gauge Catheter at skin depth: 10 cm Test dose: negative  Assessment Events: blood not aspirated, injection not painful, no injection resistance, negative IV test and no paresthesia  Additional Notes 14 cc autologous blood injected with endpont of back pain...Marland Kitchen.Marland Kitchen.Marland Kitchen. Transient .
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
## 2015-11-09 NOTE — Progress Notes (Signed)
Post Partum Day 1  Subjective:  Zonia KiefMalisa R Teague is a 25 y.o. [id number] 253w1d s/p SVD after augmentation with pitocin.  No acute events overnight.  Pt denies problems with ambulating, voiding or po intake.  She denies nausea or vomiting.  Denies abdominal pain. However, patient notes of left sided headache with radiation to left neck and upper back; these symptoms are worse when sitting up. Denies chest pain or shortness of breath. These symptoms began early this morning. Has been getting Tramadol PRN and scheduled motrin which does ease the pain. Notes that anesthesia is planning on placing a blood patch today. She has not had flatus. She has not had bowel movement.  Lochia Small.  Plan for birth control is bilateral tubal ligation.  Method of Feeding: Breast   Objective: BP 102/61 mmHg  Pulse 77  Temp(Src) 98.2 F (36.8 C) (Oral)  Resp 18  SpO2 98%  LMP 02/07/2015 (Exact Date)  Breastfeeding? Unknown  Physical Exam:  General: alert, cooperative and no distress Chest: CTAB Heart: RRR no m/r/g Abdomen: +BS, soft, nontender, fundus firm at/below umbilicus Uterine Fundus: firm, nontender  DVT Evaluation: No evidence of DVT seen on physical exam. Extremities: no edema or calf tenderness Neuro: alert and oriented. No focal deficits grossly. Normal sensation and 5/5 strength in upper and lower extremities. Eyes: PERRL.    Recent Labs  11/08/15 1133  HGB 12.1  HCT 36.7    Assessment/Plan:  ASSESSMENT: Zonia KiefMalisa R [place] is a 25 y.o. [id number] 3653w1d ppd #1 s/p NSVD doing well.   Plan for discharge tomorrow   Left Sided Headache: likely secondary to epidural with symptoms worse when sitting up. Neuro exam is unremarkable. Per patient, blood patch today per anesthesia.    LOS: 1 day   Palma HolterKanishka G Gunadasa 11/09/2015, 7:33 AM

## 2015-11-09 NOTE — OR Nursing (Signed)
Patient arrives to pacu from room 133 at 0920, LR infusing via pump at 125 cc per hour to left arm.  104/72,97% room air,74,18 . Pt states shes doing ok. Procecure start with Dr. Cristela BlueKyle Jackson at 09:40 . Pt tolerates procedure well.  Procedure end at 09:55. Time out performed with Dr. Cristela BlueKyle Jackson. Margarita Mailoni Ignatius Kloos rn

## 2015-11-09 NOTE — Telephone Encounter (Signed)
Pt informed spoke with Stacie AcresFelice with her disability and was informed did not need anymore information from our office.

## 2015-11-10 MED ORDER — ACETAMINOPHEN 325 MG PO TABS: 650.0000 mg | ORAL_TABLET | ORAL | Status: DC | PRN

## 2015-11-10 MED ORDER — IBUPROFEN 600 MG PO TABS: 600.0000 mg | ORAL_TABLET | Freq: Four times a day (QID) | ORAL | Status: DC | PRN

## 2015-11-10 MED ORDER — IBUPROFEN 600 MG PO TABS
600.0000 mg | ORAL_TABLET | Freq: Four times a day (QID) | ORAL | Status: DC
Start: 2015-11-10 — End: 2015-11-10

## 2015-11-10 MED ORDER — SENNOSIDES-DOCUSATE SODIUM 8.6-50 MG PO TABS: 1.0000 | ORAL_TABLET | Freq: Every day | ORAL | Status: DC

## 2015-11-10 NOTE — Anesthesia Postprocedure Evaluation (Signed)
Anesthesia Post Note  Patient: Zonia KiefMalisa R Kunin  Procedure(s) Performed: * No procedures listed *  Patient location during evaluation: Mother Baby Anesthesia Type: Epidural Level of consciousness: awake and alert and oriented Pain management: pain level controlled Vital Signs Assessment: post-procedure vital signs reviewed and stable Respiratory status: spontaneous breathing and respiratory function stable Cardiovascular status: stable Postop Assessment: no signs of nausea or vomiting and no headache Anesthetic complications: yes Anesthetic complication details: spinal headacheComments: Status post blood patch form wet tap. Patient had a headache from the wet tap from labor epidural insertion on 1/3  and the patch on 1/4  gave her complete relief of her symptoms. She states that she is very pleased with the results and her care.     Last Vitals:  Filed Vitals:   11/09/15 1735 11/10/15 0550  BP: 103/65 108/72  Pulse: 67 77  Temp: 36.9 C 36.9 C  Resp: 18 18    Last Pain:  Filed Vitals:   11/10/15 0550  PainSc: 3                  Curtis Cain

## 2015-11-10 NOTE — Discharge Summary (Signed)
OB Discharge Summary     Patient Name: Katie KiefMalisa R Cyr DOB: 12/15/1990 MRN: [id number]  Date of admission: 11/08/2015 Delivering MD: Shonna ChockWOUK, NOAH BEDFORD   Date of discharge: 11/10/2015  Admitting diagnosis: IOL due to Inadvertent AROM in clinc; Premature rupture of membranes  Intrauterine pregnancy: 1069w1d     Secondary diagnosis:  Active Problems:   Premature rupture of membranes  Additional problems: none     Discharge diagnosis: Term Pregnancy Delivered                                                                                                Post partum procedures:Blood patch per anesthesiology (for post epidural HA)  Augmentation: Pitocin  Complications: None  Hospital course:  Induction of Labor With Vaginal Delivery   25 y.o. yo [phone] at 7569w1d was admitted to the hospital 11/08/2015 for induction of labor.  Indication for induction: PROM (indvertent AROM in clinic).  Patient had an uncomplicated labor course as follows: Membrane Rupture Time/Date: 9:30 AM ,11/08/2015   Intrapartum Procedures: Episiotomy: None [1]                                         Lacerations:  None [1]  Patient had delivery of a Viable infant.  Information for the patient's newborn:  Katie Briggs, Girl Rosalinda [[id number]][[id number]]  Delivery Method: Vaginal, Spontaneous Delivery (Filed from Delivery Summary)   11/08/2015  Details of delivery can be found in separate delivery note.  Patient had a routine postpartum course. Patient is discharged home 11/10/2015.  Postpartum Events: Patient noted to have headache thought to be due to epidural. She received blood patch per Anesthesiology. Headaches resolved afterwards.     Physical exam  Filed Vitals:   11/09/15 1000 11/09/15 1047 11/09/15 1735 11/10/15 0550  BP: 93/62 112/78 103/65 108/72  Pulse: 70 74 67 77  Temp:  98.4 F (36.9 C) 98.5 F (36.9 C) 98.5 F (36.9 C)  TempSrc:  Oral Oral Oral  Resp:  20 18 18   SpO2: 98%  97% 98%   General: alert,  cooperative and no distress Uterine Fundus: firm DVT Evaluation: No evidence of DVT seen on physical exam. No cords or calf tenderness. No significant calf/ankle edema. Labs: Lab Results  Component Value Date   WBC 9.9 11/08/2015   HGB 12.1 11/08/2015   HCT 36.7 11/08/2015   MCV 84.4 11/08/2015   PLT 223 11/08/2015   CMP Latest Ref Rng 10/08/2013  Glucose 70 - 99 mg/dL 90  BUN 6 - 23 mg/dL 8  Creatinine 6.570.50 - 8.461.10 mg/dL 9.620.74  Sodium 952135 - 841145 mEq/L 139  Potassium 3.5 - 5.1 mEq/L 4.3  Chloride 96 - 112 mEq/L 101  CO2 19 - 32 mEq/L 29  Calcium 8.4 - 10.5 mg/dL 32.410.1  Total Protein 6.0 - 8.3 g/dL -  Total Bilirubin 0.3 - 1.2 mg/dL -  Alkaline Phos 39 - 401117 U/L -  AST 0 - 37 U/L -  ALT 0 - 35 U/L -  Discharge instruction: per After Visit Summary and "Baby and Me Booklet".  After visit meds:    Medication List    ASK your doctor about these medications        albuterol 108 (90 Base) MCG/ACT inhaler  Commonly known as:  PROVENTIL HFA;VENTOLIN HFA  Inhale 2 puffs into the lungs every 6 (six) hours as needed for wheezing or shortness of breath. Reported on 11/08/2015     calcium carbonate 500 MG chewable tablet  Commonly known as:  TUMS - dosed in mg elemental calcium  Chew 2 tablets by mouth 3 (three) times daily as needed for indigestion or heartburn.     hydrocortisone 2.5 % lotion  Apply 1 application topically 2 (two) times daily as needed (for itching).     prenatal multivitamin Tabs tablet  Take 1 tablet by mouth daily.     zolpidem 5 MG tablet  Commonly known as:  AMBIEN  Take 1 tablet (5 mg total) by mouth at bedtime as needed for sleep.        Diet: routine diet  Activity: Advance as tolerated. Pelvic rest for 6 weeks.   Outpatient follow up:6 weeks Follow up Appt:Future Appointments Date Time Provider Department Center  12/20/2015 9:00 AM Jacklyn Shell, CNM [facility]   Follow up Visit:No Follow-up on file.  Postpartum  contraception: Patient desires bilatearl tubal ligation  Newborn Data: Live born female  Birth Weight: 6 lb 7.2 oz (2925 g) APGAR: 9, 9  Baby Feeding: Breast Disposition:home with mother   11/10/2015 Palma Holter, MD   CNM attestation I have seen and examined this patient and agree with above documentation in the resident's note.   Katie Briggs is a 25 y.o. [phone] s/p SVD.   Pain is well controlled.  Plan for birth control is interval BTL.  Method of Feeding: breast  PE:  BP 108/72 mmHg  Pulse 77  Temp(Src) 98.5 F (36.9 C) (Oral)  Resp 18  SpO2 98%  LMP 02/07/2015 (Exact Date)  Breastfeeding? Unknown Fundus firm  No results for input(s): HGB, HCT in the last 72 hours.   Plan: discharge today - postpartum care discussed - f/u clinic in 6 weeks for postpartum visit   Cam Hai, CNM 11:12 PM  11/11/2015

## 2015-11-10 NOTE — Discharge Instructions (Signed)
Please make a follow up clinic appointment at [facility] in 2 weeks to make sure you are continuing to do well.   Vaginal Delivery, Care After Refer to this sheet in the next few weeks. These discharge instructions provide you with information on caring for yourself after delivery. Your health care provider may also give you specific instructions. Your treatment has been planned according to the most current medical practices available, but problems sometimes occur. Call your health care provider if you have any problems or questions after you go home. HOME CARE INSTRUCTIONS  Take over-the-counter or prescription medicines only as directed by your health care provider or pharmacist.  Do not drink alcohol, especially if you are breastfeeding or taking medicine to relieve pain.  Do not chew or smoke tobacco.  Do not use illegal drugs.  Continue to use good perineal care. Good perineal care includes:  Wiping your perineum from front to back.  Keeping your perineum clean.  Do not use tampons or douche until your health care provider says it is okay.  Shower, wash your hair, and take tub baths as directed by your health care provider.  Wear a well-fitting bra that provides breast support.  Eat healthy foods.  Drink enough fluids to keep your urine clear or pale yellow.  Eat high-fiber foods such as whole grain cereals and breads, brown rice, beans, and fresh fruits and vegetables every day. These foods may help prevent or relieve constipation.  Follow your health care provider's recommendations regarding resumption of activities such as climbing stairs, driving, lifting, exercising, or traveling.  Talk to your health care provider about resuming sexual activities. Resumption of sexual activities is dependent upon your risk of infection, your rate of healing, and your comfort and desire to resume sexual activity.  Try to have someone help you with your household activities and  your newborn for at least a few days after you leave the hospital.  Rest as much as possible. Try to rest or take a nap when your newborn is sleeping.  Increase your activities gradually.  Keep all of your scheduled postpartum appointments. It is very important to keep your scheduled follow-up appointments. At these appointments, your health care provider will be checking to make sure that you are healing physically and emotionally. SEEK MEDICAL CARE IF:   You are passing large clots from your vagina. Save any clots to show your health care provider.  You have a foul smelling discharge from your vagina.  You have trouble urinating.  You are urinating frequently.  You have pain when you urinate.  You have a change in your bowel movements.  You have increasing redness, pain, or swelling near your vaginal incision (episiotomy) or vaginal tear.  You have pus draining from your episiotomy or vaginal tear.  Your episiotomy or vaginal tear is separating.  You have painful, hard, or reddened breasts.  You have a severe headache.  You have blurred vision or see spots.  You feel sad or depressed.  You have thoughts of hurting yourself or your newborn.  You have questions about your care, the care of your newborn, or medicines.  You are dizzy or light-headed.  You have a rash.  You have nausea or vomiting.  You were breastfeeding and have not had a menstrual period within 12 weeks after you stopped breastfeeding.  You are not breastfeeding and have not had a menstrual period by the 12th week after delivery.  You have a fever. SEEK IMMEDIATE  MEDICAL CARE IF:   You have persistent pain.  You have chest pain.  You have shortness of breath.  You faint.  You have leg pain.  You have stomach pain.  Your vaginal bleeding saturates two or more sanitary pads in 1 hour.   This information is not intended to replace advice given to you by your health care provider. Make  sure you discuss any questions you have with your health care provider.   Document Released: 10/19/2000 Document Revised: 07/13/2015 Document Reviewed: 06/18/2012 Elsevier Interactive Patient Education [employer]2016 Elsevier Inc.

## 2015-11-10 NOTE — Lactation Note (Addendum)
This note was copied from the chart of Katie Kerman PasseyMalisa Paci. Lactation Consultation Note  Patient Name: Katie Briggs KGMWN'UToday's Date: 11/10/2015 Reason for consult: Follow-up assessment Katie 41 hours old. Mom reports that Katie is wanting to "stay at breast" and is lazy at the breast. Mom able to latch Katie to right breast in football position. Katie suckles rhythmically with a few swallows noted, but Katie like to take a lot of breaks. Mom states that Katie a little more fussy at left breast, but mom is easily compressible and expressible with EBM flowing from her left breast while Katie nursing on right. Enc mom to nurse with cues, and then burp Katie if she is not able to stimulate her to keep suckling past the 20-30 minutes of more active feeding. Enc mom to then put Katie to opposite breast and stimulate to nurse more continuously to attempt to make nursing times more efficient, particularly for mom.   Mom states that her milk came in with both of her older children, but she had difficulty latching second child. Mom states that she is able to latch Katie well, just having some issues with keeping her nursing. Enc mom to nurse with cues, and referred mom to Katie and Me booklet for number of diapers to expect by day of life and EBM storage guidelines. Mom aware of OP/BFSG and LC phone line assistance after D/C. Mom states that she is getting a DEBP through her insurance company, and mom was given hand pump with review. Mom states that comfort gels are soothing to her nipples as well. Enc mom to use EBM and maintain a deep latch.   Maternal Data    Feeding Feeding Type: Breast Fed  LATCH Score/Interventions Latch: Grasps breast easily, tongue down, lips flanged, rhythmical sucking.  Audible Swallowing: A few with stimulation  Type of Nipple: Everted at rest and after stimulation  Comfort (Breast/Nipple): Soft / non-tender (Left nipple sore.)     Hold (Positioning): Assistance needed to correctly  position infant at breast and maintain latch. Intervention(s): Breastfeeding basics reviewed;Support Pillows;Position options;Skin to skin  LATCH Score: 8  Lactation Tools Discussed/Used     Consult Status Consult Status: PRN    Geralynn OchsWILLIARD, Nakita Santerre 11/10/2015, 9:48 AM

## 2015-11-11 ENCOUNTER — Telehealth: Payer: Self-pay | Admitting: Obstetrics & Gynecology

## 2015-11-11 NOTE — Telephone Encounter (Signed)
Likely spinal headache, drink lots of caffeinated fluids, lots and lots  If persists go to [facility]

## 2015-11-11 NOTE — Telephone Encounter (Signed)
Pt informed to drink "lots and lots of caffeinated fluids, [facility] Pt verbalized understanding.

## 2015-11-14 ENCOUNTER — Telehealth: Payer: Self-pay | Admitting: Obstetrics & Gynecology

## 2015-11-14 NOTE — Telephone Encounter (Signed)
Pt called stating that she called last week regarding her headaches, pt states has spoke with a nurse and he told her to push some caffine. Pt states that she is still having really bad headaches and she is not sure if it is from coughing to much  Or is it from something else. Please contact pt

## 2015-11-14 NOTE — Telephone Encounter (Signed)
Pt c/o headaches extends to back and neck "neck feels very stiff." Pt states pushing caffeine and taking ibuprofen is helping "a little." Pt given an appt for evaluation on 11/14/2015 with Dr.Eure.

## 2015-11-15 ENCOUNTER — Encounter: Payer: Self-pay | Admitting: Obstetrics & Gynecology

## 2015-11-15 ENCOUNTER — Ambulatory Visit (INDEPENDENT_AMBULATORY_CARE_PROVIDER_SITE_OTHER): Payer: BLUE CROSS/BLUE SHIELD | Admitting: Obstetrics & Gynecology

## 2015-11-15 VITALS — BP 90/60 | HR 74 | Wt 170.0 lb

## 2015-11-15 DIAGNOSIS — M6248 Contracture of muscle, other site: Secondary | ICD-10-CM

## 2015-11-15 DIAGNOSIS — M62838 Other muscle spasm: Secondary | ICD-10-CM

## 2015-11-15 NOTE — Progress Notes (Signed)
Patient ID: [name], female   DOB: 03/11/1991, 25 y.o.   MRN: [id number]  Pt with neck pian and bilateral forehead pain some congestion Breastfeeding Had an epidural and then had a blood patch No evidence   Trigger Point Injection   Pre-operative diagnosis: left trapezius spasm neck myofascial pain  Post-operative diagnosis: same  After risks and benefits were explained including bleeding, infection, worsening of the pain, damage to the area being injected, weakness, allergic reaction to medications, vascular injection, and nerve damage, signed consent was obtained.  All questions were answered.    The area of the trigger point was identified and the skin prepped three times with alcohol and the alcohol allowed to dry.  Next, a 25 gauge 0.5 inch needle was placed in the area of the trigger point.  Once reproduction of the pain was elicited and negative aspiration confirmed, the trigger point was injected and the needle removed.    The patient did tolerate the procedure well and there were not complications.    Medication used:  5 cc 0.5% marcaine  Trigger points injected: 3    Trigger point(s) location(s):  bilateral   Pain went from a 5 to a 2

## 2015-11-29 ENCOUNTER — Ambulatory Visit (INDEPENDENT_AMBULATORY_CARE_PROVIDER_SITE_OTHER): Payer: BLUE CROSS/BLUE SHIELD | Admitting: Obstetrics & Gynecology

## 2015-11-29 ENCOUNTER — Encounter: Payer: Self-pay | Admitting: Obstetrics & Gynecology

## 2015-11-29 VITALS — BP 80/60 | HR 76 | Wt 164.0 lb

## 2015-11-29 DIAGNOSIS — F53 Postpartum depression: Secondary | ICD-10-CM

## 2015-11-29 DIAGNOSIS — O99345 Other mental disorders complicating the puerperium: Secondary | ICD-10-CM

## 2015-11-29 MED ORDER — ESCITALOPRAM OXALATE 10 MG PO TABS: 10.0000 mg | ORAL_TABLET | Freq: Every day | ORAL | Status: DC

## 2015-11-29 NOTE — Progress Notes (Signed)
Patient ID: COURTLYN AKI, female   DOB: Jan 20, 1991, 25 y.o.   MRN: [id number]      Chief Complaint  Patient presents with  . Follow-up    mental issues    Blood pressure 80/60, pulse 76, weight 164 lb (74.39 kg), unknown if currently breastfeeding.  24 y.o. [id number] No LMP recorded. The current method of family planning is none.  Subjective Pt has been having increasing difficulty with sadness crying spells anxiety feels like she can't cope  Objective   Pertinent ROS   Labs or studies     Impression Diagnoses this Encounter::   ICD-9-CM ICD-10-CM   1. Postpartum depression 648.44 F53    311      Established relevant diagnosis(es):   Plan/Recommendations: Meds ordered this encounter  Medications  . escitalopram (LEXAPRO) 10 MG tablet    Sig: Take 1 tablet (10 mg total) by mouth daily.    Dispense:  30 tablet    Refill:  11    Labs or Scans Ordered: No orders of the defined types were placed in this encounter.    Management::   Follow up 1  months       Face to face time:  15 minutes  Greater than 50% of the visit time was spent in counseling and coordination of care with the patient.  The summary and outline of the counseling and care coordination is summarized in the note above.   All questions were answered.

## 2015-11-30 ENCOUNTER — Telehealth: Payer: Self-pay | Admitting: *Deleted

## 2015-11-30 NOTE — Telephone Encounter (Signed)
Spoke with pt letting her know PCN was safe to take with breastfeeding. Pt voiced understanding. JSY

## 2015-12-15 ENCOUNTER — Telehealth: Payer: Self-pay | Admitting: Obstetrics & Gynecology

## 2015-12-15 NOTE — Telephone Encounter (Signed)
Pt states delivered SVD on 11/08/2015, needs a return to work note for 12/27/2015, postpartum appt scheduled for 12/19/2014.

## 2015-12-15 NOTE — Telephone Encounter (Signed)
sure

## 2015-12-20 ENCOUNTER — Ambulatory Visit: Payer: BLUE CROSS/BLUE SHIELD | Admitting: Advanced Practice Midwife

## 2015-12-29 ENCOUNTER — Ambulatory Visit (INDEPENDENT_AMBULATORY_CARE_PROVIDER_SITE_OTHER): Payer: BLUE CROSS/BLUE SHIELD | Admitting: Obstetrics & Gynecology

## 2015-12-29 ENCOUNTER — Encounter: Payer: Self-pay | Admitting: Obstetrics & Gynecology

## 2015-12-29 DIAGNOSIS — Z01818 Encounter for other preprocedural examination: Secondary | ICD-10-CM

## 2015-12-29 DIAGNOSIS — Z302 Encounter for sterilization: Secondary | ICD-10-CM

## 2015-12-29 NOTE — Progress Notes (Signed)
Patient ID: Katie Briggs, female   DOB: 1991-02-17, 25 y.o.   MRN: [id number] Subjective:     Katie Briggs is a 25 y.o. female who presents for a postpartum visit. She is 6 weeks postpartum following a spontaneous vaginal delivery. I have fully reviewed the prenatal and intrapartum course. The delivery was at 39 gestational weeks. Outcome: spontaneous vaginal delivery. Anesthesia: epidural. Postpartum course has been normal. Baby's course has been normal. Baby is feeding by breast. Bleeding no bleeding. Bowel function is normal. Bladder function is normal. Patient is ot sexually active. Contraception method is condoms. Postpartum depression screening: negative.  The following portions of the patient's history were reviewed and updated as appropriate: allergies, current medications, past family history, past medical history, past social history, past surgical history and problem list.  Review of Systems Pertinent items are noted in HPI.   Objective:    BP 98/60 mmHg  Pulse 84  Wt 162 lb (73.483 kg)  General:  alert, cooperative and no distress   Breasts:    Lungs:   Heart:    Abdomen: Normal postpartum   Vulva:  normal  Vagina: normal vagina, no discharge, exudate, lesion, or erythema  Cervix:  no cervical motion tenderness and no lesions  Corpus: normal size, contour, position, consistency, mobility, non-tender  Adnexa:  normal adnexa  Rectal Exam:         Assessment:     normal postpartum exam. Pap smear not done at today's visit.   Plan:    1. Contraception: wants BTL 2. Schedule for 01/25/2016 3. Follow up in: 5 weeks or as needed. post op visit  Preoperative History and Physical  Katie Briggs is a 25 y.o. [phone] with No LMP recorded. admitted for a laparoscopic bilateral salpingectomy for sterilization.    PMH:    Past Medical History  Diagnosis Date  . Asthma   . Anxiety   . Cervix prolapsed into vagina   . Depression   . OCD (obsessive compulsive  disorder)   . Bipolar disorder (HCC)   . Supervision of normal pregnancy in second trimester 05/11/2015     [facility] Initiated Care at   05/11/15 FOB Katie Briggs 25 yo Dating By  LMP and [place] Pap  05/11/15 GC/CT Initial:                36+wks: Genetic Screen NT/IT:  CF screen  Anatomic [place]  Flu vaccine  Tdap Recommended ~ 28wks Glucose Screen  2 hr GBS  Feed Preference  Contraception  Circumcision  Childbirth Classes  Pediatrician    . Former smoker     PSH:     Past Surgical History  Procedure Laterality Date  . None    . Colonoscopy N/A 11/12/2013    Procedure: COLONOSCOPY;  Surgeon: Corbin Ade, MD;  Location: [facility];  Service: Endoscopy;  Laterality: N/A;  2:40  . Dilation and curettage of uterus      POb/GynH:      OB History    Gravida Para Term Preterm AB TAB SAB Ectopic Multiple Living   0 1 0 1 0 0 3      SH:   Social History  Substance Use Topics  . Smoking status: Former Smoker    Types: Cigarettes  . Smokeless tobacco: Never Used  . Alcohol Use: No    FH:    Family History  Problem Relation Age of Onset  . Colon polyps Maternal  Grandmother   . Depression Maternal Grandmother   . Colon cancer       maternal great grandmother  . Depression Mother   . Hypertension Mother   . Stroke Brother     while in womb  . Seizures Brother   . Asthma Son      Allergies: No Known Allergies  Medications:       Current outpatient prescriptions:  .  escitalopram (LEXAPRO) 10 MG tablet, Take 1 tablet (10 mg total) by mouth daily., Disp: 30 tablet, Rfl: 11 .  ibuprofen (ADVIL,MOTRIN) 600 MG tablet, Take 1 tablet (600 mg total) by mouth every 6 (six) hours as needed., Disp: 90 tablet, Rfl: 0 .  Prenatal Vit-Fe Fumarate-FA (PRENATAL MULTIVITAMIN) TABS tablet, Take 1 tablet by mouth daily., Disp: , Rfl:   Review of Systems:   Review of Systems  Constitutional: Negative for fever, chills, weight loss, malaise/fatigue and diaphoresis.  HENT: Negative  for hearing loss, ear pain, nosebleeds, congestion, sore throat, neck pain, tinnitus and ear discharge.   Eyes: Negative for blurred vision, double vision, photophobia, pain, discharge and redness.  Respiratory: Negative for cough, hemoptysis, sputum production, shortness of breath, wheezing and stridor.   Cardiovascular: Negative for chest pain, palpitations, orthopnea, claudication, leg swelling and PND.  Gastrointestinal: Positive for abdominal pain. Negative for heartburn, nausea, vomiting, diarrhea, constipation, blood in stool and melena.  Genitourinary: Negative for dysuria, urgency, frequency, hematuria and flank pain.  Musculoskeletal: Negative for myalgias, back pain, joint pain and falls.  Skin: Negative for itching and rash.  Neurological: Negative for dizziness, tingling, tremors, sensory change, speech change, focal weakness, seizures, loss of consciousness, weakness and headaches.  Endo/Heme/Allergies: Negative for environmental allergies and polydipsia. Does not bruise/bleed easily.  Psychiatric/Behavioral: Negative for depression, suicidal ideas, hallucinations, memory loss and substance abuse. The patient is not nervous/anxious and does not have insomnia.      PHYSICAL EXAM:  Blood pressure 98/60, pulse 84, weight 162 lb (73.483 kg), unknown if currently breastfeeding.    Vitals reviewed. Constitutional: She is oriented to person, place, and time. She appears well-developed and well-nourished.  HENT:  Head: Normocephalic and atraumatic.  Right Ear: External ear normal.  Left Ear: External ear normal.  Nose: Nose normal.  Mouth/Throat: Oropharynx is clear and moist.  Eyes: Conjunctivae and EOM are normal. Pupils are equal, round, and reactive to light. Right eye exhibits no discharge. Left eye exhibits no discharge. No scleral icterus.  Neck: Normal range of motion. Neck supple. No tracheal deviation present. No thyromegaly present.  Cardiovascular: Normal rate, regular  rhythm, normal heart sounds and intact distal pulses.  Exam reveals no gallop and no friction rub.   No murmur heard. Respiratory: Effort normal and breath sounds normal. No respiratory distress. She has no wheezes. She has no rales. She exhibits no tenderness.  GI: Soft. Bowel sounds are normal. She exhibits no distension and no mass. There is tenderness. There is no rebound and no guarding.  Genitourinary:       Vulva is normal without lesions Vagina is pink moist without discharge Cervix normal in appearance and pap is normal Uterus is normal size, contour, position, consistency, mobility, non-tender Adnexa is negative with normal sized ovaries by sonogram  Musculoskeletal: Normal range of motion. She exhibits no edema and no tenderness.  Neurological: She is alert and oriented to person, place, and time. She has normal reflexes. She displays normal reflexes. No cranial nerve deficit. She exhibits normal muscle tone. Coordination normal.  Skin:  Skin is warm and dry. No rash noted. No erythema. No pallor.  Psychiatric: She has a normal mood and affect. Her behavior is normal. Judgment and thought content normal.    Labs: No results found for this or any previous visit (from the past 336 hour(s)).  EKG: Orders placed or performed in visit on 10/07/12  . EKG 12-Lead    Imaging Studies: No results found.    Assessment:  Desires sterilization, opts for laparoscopic bilateral salpingectomy  Patient Active Problem List   Diagnosis Date Noted  . Premature rupture of membranes 11/08/2015  . Supervision of normal pregnancy 05/11/2015  . Rectal bleeding 10/28/2013  . Unspecified constipation 10/28/2013  . Obsessive compulsive disorder 03/10/2012  . Bipolar affective disorder, depressed, severe (HCC) 03/06/2012    Class: Acute    Plan: Laparoscopic bilateral salpingectomy  Harris Kistler H 12/29/2015 11:28 AM

## 2016-01-04 ENCOUNTER — Other Ambulatory Visit: Payer: Self-pay | Admitting: Obstetrics & Gynecology

## 2016-01-09 ENCOUNTER — Encounter: Payer: Self-pay | Admitting: Adult Health

## 2016-01-09 ENCOUNTER — Ambulatory Visit (INDEPENDENT_AMBULATORY_CARE_PROVIDER_SITE_OTHER): Payer: BLUE CROSS/BLUE SHIELD | Admitting: Adult Health

## 2016-01-09 VITALS — BP 100/80 | HR 72 | Ht 61.0 in | Wt 163.0 lb

## 2016-01-09 DIAGNOSIS — B379 Candidiasis, unspecified: Secondary | ICD-10-CM

## 2016-01-09 DIAGNOSIS — L298 Other pruritus: Secondary | ICD-10-CM | POA: Diagnosis not present

## 2016-01-09 DIAGNOSIS — N898 Other specified noninflammatory disorders of vagina: Secondary | ICD-10-CM | POA: Diagnosis not present

## 2016-01-09 HISTORY — DX: Candidiasis, unspecified: B37.9

## 2016-01-09 HISTORY — DX: Other specified noninflammatory disorders of vagina: N89.8

## 2016-01-09 LAB — POCT WET PREP (WET MOUNT): WBC, Wet Prep HPF POC: POSITIVE

## 2016-01-09 MED ORDER — MICONAZOLE NITRATE APPLICATOR 100 & 2 MG-% (9GM) VA KIT: 1.0000 | PACK | VAGINAL | Status: DC

## 2016-01-09 MED ORDER — FENUGREEK 500 MG PO CAPS: ORAL_CAPSULE | ORAL | Status: DC

## 2016-01-09 NOTE — Progress Notes (Signed)
Subjective:     Patient ID: Katie KiefMalisa R Briggs, female   DOB: 05/19/1991, 25 y.o.   MRN: [id number]  HPI Katie Briggs is a 25 year old white female, married, in complaining of vaginal itching and white discharge before period.She had a vaginal delivery 11/08/15 and is breastfeeding and pumping, since returning  to work not getting as much milk.   Review of Systems Patient denies any headaches, hearing loss, fatigue, blurred vision, shortness of breath, chest pain, abdominal pain, problems with bowel movements, urination, or intercourse. No joint pain or mood swings.See HPI for positives. Reviewed past medical,surgical, social and family history. Reviewed medications and allergies.     Objective:   Physical Exam BP 100/80 mmHg  Pulse 72  Ht 5\' 1"  (1.549 m)  Wt 163 lb (73.936 kg)  BMI 30.81 kg/m2  LMP 01/09/2016  Breastfeeding? Yes Skin warm and dry.Pelvic: external genitalia is normal in appearance no lesions, vagina: period like blood,urethra has no lesions or masses noted, cervix:smooth and bulbous, uterus: normal size, shape and contour, non tender, no masses felt, adnexa: no masses or tenderness noted. Bladder is non tender and no masses felt. Wet prep: + for RBCs and +WBCs, and yeast.Discusse that period blood will help wash out vagina, but can use monistat cream esp, outside while on period, and discussed increasing water and pumping and try fenugreek, she is having tubal 3/22 by Dr Despina HiddenEure. Face time 15 minutes with 50% counseling.     Assessment:     Vaginal discharge Vaginal itching Yeast     Plan:     Use monistat OTC daily for 5-7 days Try fenugreek 3 tid Increase water and continue to pump Follow up prn

## 2016-01-09 NOTE — Patient Instructions (Signed)
Increase water, continue to pump, try fenugreek Use monistat Follow up prn

## 2016-01-18 NOTE — Patient Instructions (Signed)
Katie KiefMalisa R Mccalip  01/18/2016     @PREFPERIOPPHARMACY @   Your procedure is scheduled on  01/25/2016   Report to [facility] at  920  A.M.  Call this number if you have problems the morning of surgery:  [phone]   Remember:  Do not eat food or drink liquids after midnight.  Take these medicines the morning of surgery with A SIP OF WATER  lexapro.   Do not wear jewelry, make-up or nail polish.  Do not wear lotions, powders, or perfumes.  You may wear deodorant.  Do not shave 48 hours prior to surgery.  Men may shave face and neck.  Do not bring valuables to the hospital.  [facility] is not responsible for any belongings or valuables.  Contacts, dentures or bridgework may not be worn into surgery.  Leave your suitcase in the car.  After surgery it may be brought to your room.  For patients admitted to the hospital, discharge time will be determined by your treatment team.  Patients discharged the day of surgery will not be allowed to drive home.   Name and phone number of your driver:   family Special instructions:  none  Please read over the following fact sheets that you were given. Coughing and Deep Breathing, Surgical Site Infection Prevention, Anesthesia Post-op Instructions and Care and Recovery After Surgery      Sterilization Information, Female Female sterilization is a procedure to permanently prevent pregnancy. There are different ways to perform sterilization, but all either block or close the fallopian tubes so that your eggs cannot reach your uterus. If your egg cannot reach your uterus, sperm cannot fertilize the egg, and you cannot get pregnant.  Sterilization is performed by a surgical procedure. Sometimes these procedures are performed in a hospital while a patient is asleep. Sometimes they can be done in a clinic setting with the patient awake. The fallopian tubes can be surgically cut, tied, or sealed through a procedure called tubal ligation.  The fallopian tubes can also be closed with clips or rings. Sterilization can also be done by placing a tiny coil into each fallopian tube, which causes scar tissue to grow inside the tube. The scar tissue then blocks the tubes.  Discuss sterilization with your caregiver to answer any concerns you or your partner may have. You may want to ask what type of sterilization your caregiver performs. Some caregivers may not perform all the various options. Sterilization is permanent and should only be done if you are sure you do not want children or do not want any more children. Having a sterilization reversed may not be successful.  STERILIZATION PROCEDURES  Laparoscopic sterilization. This is a surgical method performed at a time other than right after childbirth. Two incisions are made in the lower abdomen. A thin, lighted tube (laparoscope) is inserted into one of the incisions and is used to perform the procedure. The fallopian tubes are closed with a ring or a clip. An instrument that uses heat could be used to seal the tubes closed (electrocautery).   Mini-laparotomy. This is a surgical method done 1 or 2 days after giving birth. Typically, a small incision is made just below the belly button (umbilicus) and the fallopian tubes are exposed. The tubes can then be sealed, tied, or cut.   Hysteroscopic sterilization. This is performed at a time other than right after childbirth. A tiny, spring-like coil is inserted through the  cervix and uterus and placed into the fallopian tubes. The coil causes scaring and blocks the tubes. Other forms of contraception should be used for 3 months after the procedure to allow the scar tissue to form completely. Additionally, it is required hysterosalpingography be done 3 months later to ensure that the procedure was successful. Hysterosalpingography is a procedure that uses X-rays to look at your uterus and fallopian tubes after a material to make them show up better has  been inserted. IS STERILIZATION SAFE? Sterilization is considered safe with very rare complications. Risks depend on the type of procedure you have. As with any surgical procedure, there are risks. Some risks of sterilization by any means include:   Bleeding.  Infection.  Reaction to anesthesia medicine.  Injury to surrounding organs. Risks specific to having hysteroscopic coils placed include:  The coils may not be placed correctly the first time.   The coils may move out of place.   The tubes may not get completely blocked after 3 months.   Injury to surrounding organs when placing the coil.  HOW EFFECTIVE IS FEMALE STERILIZATION? Sterilization is nearly 100% effective, but it can fail. Depending on the type of sterilization, the rate of failure can be as high as 3%. After hysteroscopic sterilization with placement of fallopian tube coils, you will need back-up birth control for 3 months after the procedure. Sterilization is effective for a lifetime.  BENEFITS OF STERILIZATION  It does not affect your hormones, and therefore will not affect your menstrual periods, sexual desire, or performance.   It is effective for a lifetime.   It is safe.   You do not need to worry about getting pregnant. Keep in mind that if you had the hysteroscopic placement procedure, you must wait 3 months after the procedure (or until your caregiver confirms) before pregnancy is not considered possible.   There are no side effects unlike other types of birth control (contraception).  DRAWBACKS OF STERILIZATION  You must be sure you do not want children or any more children. The procedure is permanent.   It does not provide protection against sexually transmitted infections (STIs).   The tubes can grow back together. If this happens, there is a risk of pregnancy. There is also an increased risk (50%) of pregnancy being an ectopic pregnancy. This is a pregnancy that happens outside of the  uterus.   This information is not intended to replace advice given to you by your health care provider. Make sure you discuss any questions you have with your health care provider.   Document Released: 04/09/2008 Document Revised: 10/27/2013 Document Reviewed: 02/07/2012 Elsevier Interactive Patient Education 2016 [employer]. Salpingectomy Salpingectomy, also called tubectomy, is the surgical removal of one of the fallopian tubes. The fallopian tubes are tubes that are connected to the uterus. These tubes transport the egg from the ovary to the uterus. A salpingectomy may be done for various reasons, including:   A tubal (ectopic) pregnancy. This is especially true if the tube ruptures.  An infected fallopian tube.  The need to remove the fallopian tube when removing an ovary with a cyst or tumor.  The need to remove the fallopian tube when removing the uterus.  Cancer of the fallopian tube or nearby organs. Removing one fallopian tube does not prevent you from becoming pregnant. It also does not cause problems with your menstrual periods.  LET [facility] CARE PROVIDER KNOW ABOUT:  Any allergies you have.  All medicines you are taking,  including vitamins, herbs, eye drops, creams, and over-the-counter medicines.  Previous problems you or members of your family have had with the use of anesthetics.  Any blood disorders you have.  Previous surgeries you have had.  Medical conditions you have. RISKS AND COMPLICATIONS  Generally, this is a safe procedure. However, as with any procedure, complications can occur. Possible complications include:  Injury to surrounding organs.  Bleeding.  Infection.  Problems related to anesthesia. BEFORE THE PROCEDURE  Ask your health care provider about changing or stopping your regular medicines. You may need to stop taking certain medicines, such as aspirin or blood thinners, at least 1 week before the surgery.  Do not eat or drink  anything for at least 8 hours before the surgery.  If you smoke, do not smoke for at least 2 weeks before the surgery.  Make plans to have someone drive you home after the procedure or after your hospital stay. Also arrange for someone to help you with activities during recovery. PROCEDURE   You will be given medicine to help you relax before the procedure (sedative). You will then be given medicine to make you sleep through the procedure (general anesthetic). These medicines will be given through an IV access tube that is put into one of your veins.  Once you are asleep, your lower abdomen will be shaved and cleaned. A thin, flexible tube (catheter) will be placed in your bladder.  The surgeon may use a laparoscopic, robotic, or open technique for this surgery:  In the laparoscopic technique, the surgery is done through two small cuts (incisions) in the abdomen. A thin, lighted tube with a tiny camera on the end (laparoscope) is inserted into one of the incisions. The tools needed for the procedure are put through the other incision.  A robotic technique may be chosen to perform complex surgery in a small space. In the robotic technique, small incisions will be made. A camera and surgical instruments are passed through the incisions. Surgical instruments will be controlled with the help of a robotic arm.  In the open technique, the surgery is done through one large incision in the abdomen.  Using any of these techniques, the surgeon removes the fallopian tube from where it attaches to the uterus. The blood vessels will be clamped and tied.  The surgeon then uses staples or stitches to close the incision or incisions. AFTER THE PROCEDURE   You will be taken to a recovery area where your progress will be monitored for 1-3 hours.  If the laparoscopic technique was used, you may be allowed to go home after several hours. You may have some shoulder pain after the laparoscopic procedure. This is  normal and usually goes away in a day or two.  If the open technique was used, you will be admitted to the hospital for a couple of days.  You will be given pain medicine if needed.  The IV access tube and catheter will be removed before you are discharged.   This information is not intended to replace advice given to you by your health care provider. Make sure you discuss any questions you have with your health care provider.   Document Released: 03/10/2009 Document Revised: 11/12/2014 Document Reviewed: 04/15/2013 Elsevier Interactive Patient Education 2016 [employer]. Salpingectomy, Care After Refer to this sheet in the next few weeks. These instructions provide you with information on caring for yourself after your procedure. Your health care provider may also give you more specific instructions.  Your treatment has been planned according to current medical practices, but problems sometimes occur. Call your health care provider if you have any problems or questions after your procedure. WHAT TO EXPECT AFTER THE PROCEDURE After your procedure, it is typical to have the following:  Abdominal pain that can be controlled with pain medicine.  Vaginal spotting.  Tiredness. HOME CARE INSTRUCTIONS  Get plenty of rest and sleep.  Only take over-the-counter or prescription medicines as directed by your health care provider.  Keep incision areas clean and dry. Remove or change any bandages (dressings) only as directed by your health care provider.  You may resume your regular diet. Eat a well-balanced diet.  Drink enough fluids to keep your urine clear or pale yellow.  Limit exercise and activities as directed by your health care provider. Do not lift anything heavier than 5 lb (2.3 kg) until your health care provider approves.  Do not drive until your health care provider approves.  Do not have sexual intercourse until your health care provider says it is okay.  Take your  temperature twice a day for the first week. Write those temperatures down.  Follow up with your health care provider as directed. SEEK MEDICAL CARE IF:  You have pain when you urinate.  You see pus coming out of the incision, or the incision is separating.  You have increasing abdominal pain.  You have swelling or redness in the incision area.  You develop a rash.  You feel lightheaded.  You have pain that is not controlled with medicine. SEEK IMMEDIATE MEDICAL CARE IF:  You develop a fever.  You have increasing abdominal pain.  You develop chest or leg pain.  You develop shortness of breath.  You pass out.   This information is not intended to replace advice given to you by your health care provider. Make sure you discuss any questions you have with your health care provider.   Document Released: 01/26/2011 Document Revised: 11/12/2014 Document Reviewed: 04/15/2013 Elsevier Interactive Patient Education 2016 [employer] PATIENT INSTRUCTIONS POST-ANESTHESIA  IMMEDIATELY FOLLOWING SURGERY:  Do not drive or operate machinery for the first twenty four hours after surgery.  Do not make any important decisions for twenty four hours after surgery or while taking narcotic pain medications or sedatives.  If you develop intractable nausea and vomiting or a severe headache please notify your doctor immediately.  FOLLOW-UP:  Please make an appointment with your surgeon as instructed. You do not need to follow up with anesthesia unless specifically instructed to do so.  WOUND CARE INSTRUCTIONS (if applicable):  Keep a dry clean dressing on the anesthesia/puncture wound site if there is drainage.  Once the wound has quit draining you may leave it open to air.  Generally you should leave the bandage intact for twenty four hours unless there is drainage.  If the epidural site drains for more than 36-48 hours please call the anesthesia department.  QUESTIONS?:  Please feel free to call  your physician or the hospital operator if you have any questions, and they will be happy to assist you.

## 2016-01-20 ENCOUNTER — Telehealth: Payer: Self-pay | Admitting: Obstetrics & Gynecology

## 2016-01-20 ENCOUNTER — Encounter (HOSPITAL_COMMUNITY): Payer: Self-pay

## 2016-01-20 ENCOUNTER — Encounter (HOSPITAL_COMMUNITY)
Admission: RE | Admit: 2016-01-20 | Discharge: 2016-01-20 | Disposition: A | Discharge status: Home / Self Care | Payer: BLUE CROSS/BLUE SHIELD | Source: Ambulatory Visit | Attending: Obstetrics & Gynecology | Admitting: Obstetrics & Gynecology

## 2016-01-20 DIAGNOSIS — Z01812 Encounter for preprocedural laboratory examination: Secondary | ICD-10-CM | POA: Insufficient documentation

## 2016-01-20 HISTORY — DX: Anemia, unspecified: D64.9

## 2016-01-20 LAB — COMPREHENSIVE METABOLIC PANEL
ALT: 32 U/L (ref 14–54)
AST: 23 U/L (ref 15–41)
Albumin: 4.1 g/dL (ref 3.5–5.0)
Alkaline Phosphatase: 92 U/L (ref 38–126)
Anion gap: 8 (ref 5–15)
BUN: 13 mg/dL (ref 6–20)
CO2: 24 mmol/L (ref 22–32)
Calcium: 9 mg/dL (ref 8.9–10.3)
Chloride: 109 mmol/L (ref 101–111)
Creatinine, Ser: 0.64 mg/dL (ref 0.44–1.00)
GFR calc Af Amer: >60 mL/min (ref >60)
GFR calc non Af Amer: >60 mL/min (ref >60)
Glucose, Bld: 81 mg/dL (ref 65–99)
Potassium: 4.1 mmol/L (ref 3.5–5.1)
Sodium: 141 mmol/L (ref 135–145)
Total Bilirubin: 0.8 mg/dL (ref 0.3–1.2)
Total Protein: 6.9 g/dL (ref 6.5–8.1)

## 2016-01-20 LAB — URINALYSIS, ROUTINE W REFLEX MICROSCOPIC
Bilirubin Urine: NEGATIVE
Glucose, UA: NEGATIVE mg/dL
Nitrite: POSITIVE — AB
Protein, ur: 30 mg/dL — AB
Specific Gravity, Urine: 1.015 (ref 1.005–1.030)
pH: 5.5 (ref 5.0–8.0)

## 2016-01-20 LAB — CBC
HCT: 40.9 % (ref 36.0–46.0)
Hemoglobin: 13.4 g/dL (ref 12.0–15.0)
MCH: 28.1 pg (ref 26.0–34.0)
MCHC: 32.8 g/dL (ref 30.0–36.0)
MCV: 85.7 fL (ref 78.0–100.0)
Platelets: 256 10*3/uL (ref 150–400)
RBC: 4.77 MIL/uL (ref 3.87–5.11)
RDW: 15.6 % — ABNORMAL HIGH (ref 11.5–15.5)
WBC: 9.2 10*3/uL (ref 4.0–10.5)

## 2016-01-20 LAB — HCG, QUANTITATIVE, PREGNANCY: hCG, Beta Chain, Quant, S: <1 m[IU]/mL (ref <5)

## 2016-01-20 LAB — URINE MICROSCOPIC-ADD ON

## 2016-01-20 MED ORDER — CEPHALEXIN 500 MG PO CAPS: 500.0000 mg | ORAL_CAPSULE | Freq: Three times a day (TID) | ORAL | Status: DC

## 2016-01-20 NOTE — Telephone Encounter (Signed)
Pre op urine consistent with UYI: keflex 500 TID x 7 days e prescribed P[t notified by phone and left message

## 2016-01-20 NOTE — Pre-Procedure Instructions (Signed)
Patient given inforamation to sign up for my chart at home. 

## 2016-01-25 ENCOUNTER — Encounter (HOSPITAL_COMMUNITY): Payer: Self-pay | Admitting: *Deleted

## 2016-01-25 ENCOUNTER — Encounter (HOSPITAL_COMMUNITY)
Admission: RE | Disposition: A | Discharge status: Home / Self Care | Payer: Self-pay | Source: Ambulatory Visit | Attending: Obstetrics & Gynecology

## 2016-01-25 ENCOUNTER — Ambulatory Visit (HOSPITAL_COMMUNITY): Payer: BLUE CROSS/BLUE SHIELD | Admitting: Anesthesiology

## 2016-01-25 ENCOUNTER — Ambulatory Visit (HOSPITAL_COMMUNITY)
Admission: RE | Admit: 2016-01-25 | Discharge: 2016-01-25 | Disposition: A | Discharge status: Home / Self Care | Payer: BLUE CROSS/BLUE SHIELD | Source: Ambulatory Visit | Attending: Obstetrics & Gynecology | Admitting: Obstetrics & Gynecology

## 2016-01-25 DIAGNOSIS — Z302 Encounter for sterilization: Secondary | ICD-10-CM | POA: Diagnosis not present

## 2016-01-25 DIAGNOSIS — F319 Bipolar disorder, unspecified: Secondary | ICD-10-CM | POA: Insufficient documentation

## 2016-01-25 DIAGNOSIS — Z87891 Personal history of nicotine dependence: Secondary | ICD-10-CM | POA: Diagnosis not present

## 2016-01-25 DIAGNOSIS — F329 Major depressive disorder, single episode, unspecified: Secondary | ICD-10-CM | POA: Diagnosis not present

## 2016-01-25 DIAGNOSIS — F419 Anxiety disorder, unspecified: Secondary | ICD-10-CM | POA: Insufficient documentation

## 2016-01-25 DIAGNOSIS — Z79899 Other long term (current) drug therapy: Secondary | ICD-10-CM | POA: Diagnosis not present

## 2016-01-25 DIAGNOSIS — F429 Obsessive-compulsive disorder, unspecified: Secondary | ICD-10-CM | POA: Diagnosis not present

## 2016-01-25 HISTORY — PX: LAPAROSCOPIC BILATERAL SALPINGECTOMY: SHX5889

## 2016-01-25 SURGERY — SALPINGECTOMY, BILATERAL, LAPAROSCOPIC
Anesthesia: General | Site: Vagina | Laterality: Bilateral

## 2016-01-25 MED ORDER — KETOROLAC TROMETHAMINE 30 MG/ML IJ SOLN
30.0000 mg | Freq: Once | INTRAMUSCULAR | Status: AC
  Administered 2016-01-25: 30 mg via INTRAVENOUS
  Filled 2016-01-25: qty 1

## 2016-01-25 MED ORDER — LIDOCAINE HCL 1 % IJ SOLN
INTRAMUSCULAR | Status: DC | PRN
  Administered 2016-01-25: 40 mg via INTRADERMAL

## 2016-01-25 MED ORDER — DEXTROSE 5 % IV SOLN
2.0000 g | INTRAVENOUS | Status: AC
  Administered 2016-01-25: 2 g via INTRAVENOUS
  Filled 2016-01-25: qty 20

## 2016-01-25 MED ORDER — FENTANYL CITRATE (PF) 250 MCG/5ML IJ SOLN
INTRAMUSCULAR | Status: DC | PRN
  Administered 2016-01-25 (×3): 50 ug via INTRAVENOUS

## 2016-01-25 MED ORDER — ONDANSETRON HCL 4 MG/2ML IJ SOLN
4.0000 mg | Freq: Once | INTRAMUSCULAR | Status: AC
  Administered 2016-01-25: 4 mg via INTRAVENOUS

## 2016-01-25 MED ORDER — FENTANYL CITRATE (PF) 250 MCG/5ML IJ SOLN
INTRAMUSCULAR | Status: AC
  Filled 2016-01-25: qty 5

## 2016-01-25 MED ORDER — FENTANYL CITRATE (PF) 100 MCG/2ML IJ SOLN
25.0000 ug | INTRAMUSCULAR | Status: DC | PRN
  Administered 2016-01-25 (×2): 50 ug via INTRAVENOUS

## 2016-01-25 MED ORDER — ROCURONIUM BROMIDE 50 MG/5ML IV SOLN
INTRAVENOUS | Status: AC
  Filled 2016-01-25: qty 1

## 2016-01-25 MED ORDER — FENTANYL CITRATE (PF) 100 MCG/2ML IJ SOLN
INTRAMUSCULAR | Status: AC
Start: 2016-01-25 — End: 2016-01-25
  Filled 2016-01-25: qty 2

## 2016-01-25 MED ORDER — GLYCOPYRROLATE 0.2 MG/ML IJ SOLN
INTRAMUSCULAR | Status: DC | PRN
  Administered 2016-01-25: .6 mg via INTRAVENOUS

## 2016-01-25 MED ORDER — LIDOCAINE HCL (PF) 1 % IJ SOLN
INTRAMUSCULAR | Status: AC
  Filled 2016-01-25: qty 5

## 2016-01-25 MED ORDER — DEXAMETHASONE SODIUM PHOSPHATE 4 MG/ML IJ SOLN
INTRAMUSCULAR | Status: AC
  Filled 2016-01-25: qty 1

## 2016-01-25 MED ORDER — ONDANSETRON HCL 4 MG/2ML IJ SOLN
4.0000 mg | Freq: Once | INTRAMUSCULAR | Status: AC | PRN
  Administered 2016-01-25: 4 mg via INTRAVENOUS
  Filled 2016-01-25: qty 2

## 2016-01-25 MED ORDER — CEFAZOLIN SODIUM-DEXTROSE 2-3 GM-% IV SOLR
INTRAVENOUS | Status: AC
  Filled 2016-01-25: qty 50

## 2016-01-25 MED ORDER — MIDAZOLAM HCL 2 MG/2ML IJ SOLN
1.0000 mg | INTRAMUSCULAR | Status: DC | PRN
  Administered 2016-01-25: 2 mg via INTRAVENOUS

## 2016-01-25 MED ORDER — KETOROLAC TROMETHAMINE 10 MG PO TABS: 10.0000 mg | ORAL_TABLET | Freq: Three times a day (TID) | ORAL | Status: DC | PRN

## 2016-01-25 MED ORDER — BUPIVACAINE LIPOSOME 1.3 % IJ SUSP
INTRAMUSCULAR | Status: AC
  Filled 2016-01-25: qty 20

## 2016-01-25 MED ORDER — LACTATED RINGERS IV SOLN
INTRAVENOUS | Status: DC
  Administered 2016-01-25: 10:00:00 via INTRAVENOUS

## 2016-01-25 MED ORDER — HYDROCODONE-ACETAMINOPHEN 5-325 MG PO TABS: 1.0000 | ORAL_TABLET | Freq: Four times a day (QID) | ORAL | Status: DC | PRN

## 2016-01-25 MED ORDER — MIDAZOLAM HCL 2 MG/2ML IJ SOLN
INTRAMUSCULAR | Status: AC
  Filled 2016-01-25: qty 2

## 2016-01-25 MED ORDER — GLYCOPYRROLATE 0.2 MG/ML IJ SOLN
INTRAMUSCULAR | Status: AC
  Filled 2016-01-25: qty 3

## 2016-01-25 MED ORDER — ONDANSETRON HCL 8 MG PO TABS: 8.0000 mg | ORAL_TABLET | Freq: Three times a day (TID) | ORAL | Status: DC | PRN

## 2016-01-25 MED ORDER — BUPIVACAINE LIPOSOME 1.3 % IJ SUSP
INTRAMUSCULAR | Status: DC | PRN
  Administered 2016-01-25: 20 mL

## 2016-01-25 MED ORDER — BUPIVACAINE LIPOSOME 1.3 % IJ SUSP
20.0000 mL | Freq: Once | INTRAMUSCULAR | Status: DC
  Filled 2016-01-25: qty 20

## 2016-01-25 MED ORDER — ONDANSETRON HCL 4 MG/2ML IJ SOLN
INTRAMUSCULAR | Status: AC
  Filled 2016-01-25: qty 2

## 2016-01-25 MED ORDER — ROCURONIUM BROMIDE 100 MG/10ML IV SOLN
INTRAVENOUS | Status: DC | PRN
  Administered 2016-01-25: 30 mg via INTRAVENOUS

## 2016-01-25 MED ORDER — PROPOFOL 10 MG/ML IV BOLUS
INTRAVENOUS | Status: DC | PRN
  Administered 2016-01-25: 150 mg via INTRAVENOUS

## 2016-01-25 MED ORDER — NEOSTIGMINE METHYLSULFATE 10 MG/10ML IV SOLN
INTRAVENOUS | Status: DC | PRN
  Administered 2016-01-25: 3 mg via INTRAVENOUS

## 2016-01-25 MED ORDER — DEXAMETHASONE SODIUM PHOSPHATE 4 MG/ML IJ SOLN
4.0000 mg | Freq: Once | INTRAMUSCULAR | Status: AC
  Administered 2016-01-25: 4 mg via INTRAVENOUS

## 2016-01-25 MED ORDER — SODIUM CHLORIDE 0.9 % IR SOLN
Status: DC | PRN
Start: 2016-01-25 — End: 2016-01-25
  Administered 2016-01-25: 1000 mL

## 2016-01-25 SURGICAL SUPPLY — 41 items
APPLIER CLIP 5 13 M/L LIGAMAX5 (MISCELLANEOUS)
BAG HAMPER (MISCELLANEOUS) ×2 IMPLANT
BLADE SURG SZ11 CARB STEEL (BLADE) ×2 IMPLANT
CLIP APPLIE 5 13 M/L LIGAMAX5 (MISCELLANEOUS) IMPLANT
CLOTH BEACON ORANGE TIMEOUT ST (SAFETY) ×2 IMPLANT
COVER LIGHT HANDLE STERIS (MISCELLANEOUS) ×4 IMPLANT
DRAPE PROXIMA HALF (DRAPES) ×2 IMPLANT
ELECT REM PT RETURN 9FT ADLT (ELECTROSURGICAL) ×2
ELECTRODE REM PT RTRN 9FT ADLT (ELECTROSURGICAL) ×1 IMPLANT
FILTER SMOKE EVAC LAPAROSHD (FILTER) IMPLANT
FORMALIN 10 PREFIL 120ML (MISCELLANEOUS) ×4 IMPLANT
GLOVE BIOGEL PI IND STRL 7.0 (GLOVE) ×1 IMPLANT
GLOVE BIOGEL PI IND STRL 8 (GLOVE) ×1 IMPLANT
GLOVE BIOGEL PI INDICATOR 7.0 (GLOVE) ×1
GLOVE BIOGEL PI INDICATOR 8 (GLOVE) ×1
GLOVE ECLIPSE 8.0 STRL XLNG CF (GLOVE) ×2 IMPLANT
GOWN STRL REUS W/TWL LRG LVL3 (GOWN DISPOSABLE) ×2 IMPLANT
GOWN STRL REUS W/TWL XL LVL3 (GOWN DISPOSABLE) ×2 IMPLANT
INST SET LAPROSCOPIC GYN AP (KITS) ×2 IMPLANT
IV NS IRRIG 3000ML ARTHROMATIC (IV SOLUTION) IMPLANT
KIT ROOM TURNOVER AP CYSTO (KITS) ×2 IMPLANT
MANIFOLD NEPTUNE II (INSTRUMENTS) ×2 IMPLANT
NEEDLE HYPO 21X1.5 SAFETY (NEEDLE) ×2 IMPLANT
NEEDLE INSUFFLATION 14GA 120MM (NEEDLE) ×2 IMPLANT
PACK PERI GYN (CUSTOM PROCEDURE TRAY) ×2 IMPLANT
PAD ARMBOARD 7.5X6 YLW CONV (MISCELLANEOUS) ×2 IMPLANT
SET BASIN LINEN APH (SET/KITS/TRAYS/PACK) ×2 IMPLANT
SET TUBE IRRIG SUCTION NO TIP (IRRIGATION / IRRIGATOR) IMPLANT
SHEARS HARMONIC ACE PLUS 36CM (ENDOMECHANICALS) ×2 IMPLANT
SLEEVE ENDOPATH XCEL 5M (ENDOMECHANICALS) ×2 IMPLANT
SOLUTION ANTI FOG 6CC (MISCELLANEOUS) ×2 IMPLANT
SPONGE GAUZE 2X2 8PLY STRL LF (GAUZE/BANDAGES/DRESSINGS) ×6 IMPLANT
STAPLER VISISTAT 35W (STAPLE) ×2 IMPLANT
SUT VICRYL 0 UR6 27IN ABS (SUTURE) ×2 IMPLANT
SYR 20CC LL (SYRINGE) ×2 IMPLANT
SYRINGE 10CC LL (SYRINGE) ×2 IMPLANT
TAPE CLOTH SURG 4X10 WHT LF (GAUZE/BANDAGES/DRESSINGS) ×2 IMPLANT
TROCAR ENDO BLADELESS 11MM (ENDOMECHANICALS) ×2 IMPLANT
TROCAR XCEL NON-BLD 5MMX100MML (ENDOMECHANICALS) ×2 IMPLANT
TUBING INSUF HEATED (TUBING) ×2 IMPLANT
WARMER LAPAROSCOPE (MISCELLANEOUS) ×2 IMPLANT

## 2016-01-25 NOTE — H&P (Signed)
Preoperative History and Physical  Katie Briggs is a 25 y.o. [phone] with Patient's last menstrual period was 01/11/2016. admitted for a laparosc0ic bilateral salpingectomy for sterilization   PMH:    Past Medical History  Diagnosis Date  . Asthma   . Anxiety   . Cervix prolapsed into vagina   . Depression   . OCD (obsessive compulsive disorder)   . Bipolar disorder (HCC)   . Supervision of normal pregnancy in second trimester 05/11/2015     [facility] Care at   05/11/15 FOB Kerney Elbehristopher Lowrey 25 yo Dating By  LMP and US Pap  05/11/15 GC/CT Initial:                36+wks: Genetic Screen NT/IT:  CF screen  Anatomic US  Flu vaccine  Tdap Recommended ~ 28wks Glucose Screen  2 hr GBS  Feed Preference  Contraception  Circumcision  Childbirth Classes  Pediatrician    . Former smoker   . Vaginal discharge 01/09/2016  . Itching in the vaginal area 01/09/2016  . Yeast infection 01/09/2016  . Anemia     PSH:     Past Surgical History  Procedure Laterality Date  . None    . Colonoscopy N/A 11/12/2013    Procedure: COLONOSCOPY;  Surgeon: Corbin Adeobert M Rourk, MD;  Location: [facility];  Service: Endoscopy;  Laterality: N/A;  2:40  . Dilation and curettage of uterus      POb/GynH:      OB History    Gravida Para Term Preterm AB TAB SAB Ectopic Multiple Living   4 3 3  0 1 0 1 0 0 3      SH:   Social History  Substance Use Topics  . Smoking status: Former Smoker -- 0.25 packs/day for .5 years    Types: Cigarettes    Quit date: 01/20/2007  . Smokeless tobacco: Never Used  . Alcohol Use: No    FH:    Family History  Problem Relation Age of Onset  . Colon polyps Maternal Grandmother   . Depression Maternal Grandmother   . Colon cancer       maternal great grandmother  . Depression Mother   . Hypertension Mother   . Stroke Brother     while in womb  . Seizures Brother   . Asthma Son      Allergies: No Known Allergies  Medications:       Current  facility-administered medications:  .  bupivacaine liposome (EXPAREL) 1.3 % injection 266 mg, 20 mL, Infiltration, Once, Lazaro ArmsLuther H Latoria Dry, MD .  ceFAZolin (ANCEF) 2 g in dextrose 5 % 50 mL IVPB, 2 g, Intravenous, On Call to OR, Lazaro ArmsLuther H Miko Sirico, MD .  lactated ringers infusion, , Intravenous, Continuous, Laurene FootmanLuis Gonzalez, MD, Last Rate: 75 mL/hr at 01/25/16 0950 .  midazolam (VERSED) injection 1-2 mg, 1-2 mg, Intravenous, Q5 min PRN, Laurene FootmanLuis Gonzalez, MD, 2 mg at 01/25/16 [id number]  Review of Systems:   Review of Systems  Constitutional: Negative for fever, chills, weight loss, malaise/fatigue and diaphoresis.  HENT: Negative for hearing loss, ear pain, nosebleeds, congestion, sore throat, neck pain, tinnitus and ear discharge.   Eyes: Negative for blurred vision, double vision, photophobia, pain, discharge and redness.  Respiratory: Negative for cough, hemoptysis, sputum production, shortness of breath, wheezing and stridor.   Cardiovascular: Negative for chest pain, palpitations, orthopnea, claudication, leg swelling and PND.  Gastrointestinal: Positive for abdominal pain. Negative for heartburn, nausea, vomiting, diarrhea, constipation,  blood in stool and melena.  Genitourinary: Negative for dysuria, urgency, frequency, hematuria and flank pain.  Musculoskeletal: Negative for myalgias, back pain, joint pain and falls.  Skin: Negative for itching and rash.  Neurological: Negative for dizziness, tingling, tremors, sensory change, speech change, focal weakness, seizures, loss of consciousness, weakness and headaches.  Endo/Heme/Allergies: Negative for environmental allergies and polydipsia. Does not bruise/bleed easily.  Psychiatric/Behavioral: Negative for depression, suicidal ideas, hallucinations, memory loss and substance abuse. The patient is not nervous/anxious and does not have insomnia.      PHYSICAL EXAM:  Blood pressure 96/53, pulse 59, temperature 97.9 F (36.6 C), temperature source Oral, resp.  rate 18, height  (1.549 m), weight 160 lb (72.576 kg), last menstrual period 01/11/2016, SpO2 98 %, currently breastfeeding.    Vitals reviewed. Constitutional: She is oriented to person, place, and time. She appears well-developed and well-nourished.  HENT:  Head: Normocephalic and atraumatic.  Right Ear: External ear normal.  Left Ear: External ear normal.  Nose: Nose normal.  Mouth/Throat: Oropharynx is clear and moist.  Eyes: Conjunctivae and EOM are normal. Pupils are equal, round, and reactive to light. Right eye exhibits no discharge. Left eye exhibits no discharge. No scleral icterus.  Neck: Normal range of motion. Neck supple. No tracheal deviation present. No thyromegaly present.  Cardiovascular: Normal rate, regular rhythm, normal heart sounds and intact distal pulses.  Exam reveals no gallop and no friction rub.   No murmur heard. Respiratory: Effort normal and breath sounds normal. No respiratory distress. She has no wheezes. She has no rales. She exhibits no tenderness.  GI: Soft. Bowel sounds are normal. She exhibits no distension and no mass. There is tenderness. There is no rebound and no guarding.  Genitourinary:       Vulva is normal without lesions Vagina is pink moist without discharge Cervix normal in appearance and pap is normal Uterus is normal size, contour, position, consistency, mobility, non-tender Adnexa is negative with normal sized ovaries by sonogram  Musculoskeletal: Normal range of motion. She exhibits no edema and no tenderness.  Neurological: She is alert and oriented to person, place, and time. She has normal reflexes. She displays normal reflexes. No cranial nerve deficit. She exhibits normal muscle tone. Coordination normal.  Skin: Skin is warm and dry. No rash noted. No erythema. No pallor.  Psychiatric: She has a normal mood and affect. Her behavior is normal. Judgment and thought content normal.    Labs: Results for orders placed or  performed during the hospital encounter of 01/20/16 (from the past 336 hour(s))  CBC   Collection Time: 01/20/16  9:25 AM  Result Value Ref Range   WBC 9.2 4.0 - 10.5 K/uL   RBC 4.77 3.87 - 5.11 MIL/uL   Hemoglobin 13.4 12.0 - 15.0 g/dL   HCT 40.9 81.1 - 91.4 %   MCV 85.7 78.0 - 100.0 fL   MCH 28.1 26.0 - 34.0 pg   MCHC 32.8 30.0 - 36.0 g/dL   RDW 78.2 (H) 95.6 - 21.3 %   Platelets 256 150 - 400 K/uL  Comprehensive metabolic panel   Collection Time: 01/20/16  9:25 AM  Result Value Ref Range   Sodium 141 135 - 145 mmol/L   Potassium 4.1 3.5 - 5.1 mmol/L   Chloride 109 101 - 111 mmol/L   CO2 24 22 - 32 mmol/L   Glucose, Bld 81 65 - 99 mg/dL   BUN 13 6 - 20 mg/dL   Creatinine, Ser 0.86  0.44 - 1.00 mg/dL   Calcium 9.0 8.9 - 16.1 mg/dL   Total Protein 6.9 6.5 - 8.1 g/dL   Albumin 4.1 3.5 - 5.0 g/dL   AST 23 15 - 41 U/L   ALT 32 14 - 54 U/L   Alkaline Phosphatase 92 38 - 126 U/L   Total Bilirubin 0.8 0.3 - 1.2 mg/dL   GFR calc non Af Amer >60 >60 mL/min   GFR calc Af Amer >60 >60 mL/min   Anion gap 8 5 - 15  hCG, quantitative, pregnancy   Collection Time: 01/20/16  9:25 AM  Result Value Ref Range   hCG, Beta Chain, Quant, S <1 <5 mIU/mL  Urinalysis, Routine w reflex microscopic (not at [facility])   Collection Time: 01/20/16  9:30 AM  Result Value Ref Range   Color, Urine YELLOW YELLOW   APPearance CLEAR CLEAR   Specific Gravity, Urine 1.015 1.005 - 1.030   pH 5.5 5.0 - 8.0   Glucose, UA NEGATIVE NEGATIVE mg/dL   Hgb urine dipstick MODERATE (A) NEGATIVE   Bilirubin Urine NEGATIVE NEGATIVE   Ketones, ur TRACE (A) NEGATIVE mg/dL   Protein, ur 30 (A) NEGATIVE mg/dL   Nitrite POSITIVE (A) NEGATIVE   Leukocytes, UA MODERATE (A) NEGATIVE  Urine microscopic-add on   Collection Time: 01/20/16  9:30 AM  Result Value Ref Range   Squamous Epithelial / LPF TOO NUMEROUS TO COUNT (A) NONE SEEN   WBC, UA TOO NUMEROUS TO COUNT 0 - 5 WBC/hpf   RBC / HPF TOO NUMEROUS TO COUNT 0 - 5 RBC/hpf    Bacteria, UA MANY (A) NONE SEEN    EKG: Orders placed or performed in visit on 10/07/12  . EKG 12-Lead    Imaging Studies: No results found.    Assessment: Multiparous female desires sterilization, chooses bilateral salpingectomy  Patient Active Problem List   Diagnosis Date Noted  . Vaginal discharge 01/09/2016  . Itching in the vaginal area 01/09/2016  . Yeast infection 01/09/2016  . Premature rupture of membranes 11/08/2015  . Supervision of normal pregnancy 05/11/2015  . Rectal bleeding 10/28/2013  . Unspecified constipation 10/28/2013  . Obsessive compulsive disorder 03/10/2012  . Bipolar affective disorder, depressed, severe (HCC) 03/06/2012    Class: Acute    Plan: Laparoscopic bilateral salpingectomy for sterilization  Nowell Sites H 01/25/2016 10:24 AM

## 2016-01-25 NOTE — Op Note (Signed)
Preoperative Diagnosis:  1.  Multiparous female desires permanent sterilization                                          2.  Elects to have bilateral salpingectomy for ovarian cancer                                                     prophylaxis  Postoperative Diagnosis:  Same as above  Procedure:  Laparoscopic Bilateral Salpingectomy  Surgeon:  Rockne CoonsLuther H Madoc Holquin  Jr MD  Anaesthesia: general  Findings:  Patient had normal pelvic anatomy and no intraperitoneal abnormalities.  Description of Operation:  Patient was taken to the OR and placed into supine position where she underwent general anaesthesia.  She was placed in the dorsal lithotomy position and prepped and draped in the usual sterile fashion.  An incision was made in the umbilicus and dissection taken down to the rectus fascia which was incised and opened.  The non bladed trocar was then placed and the peritoneal cavity was insufflated.  The above noted findings were observed.  Additional trocars were placed in the right and left lower quadrants under direct visualization without difficulty.  The Harmonic scalpel was employed and salpingectomy of both the right and left tubes was performed.   The tubes were removed from the peritoneal cavity and sent to pathology.  There was good hemostasis bilaterally.  The fascia, peritoneum and subcutaneous tissue were closed using 0 vicryl.  The skin was closed using staples.  Exparel 266 mg 20 cc was injected in the 3 incision trocar sites. The patient was awakened from anaesthesia and taken to the PACU with all counts being correct x 3.  The patient received  1 gram of ancef andToradol 30 mg IV preoperatively.  Edee Nifong H 01/25/2016 11:32 AM

## 2016-01-25 NOTE — Transfer of Care (Signed)
Immediate Anesthesia Transfer of Care Note  Patient: Katie KiefMalisa R Lac  Procedure(s) Performed: Procedure(s): LAPAROSCOPIC BILATERAL SALPINGECTOMY (Bilateral)  Patient Location: PACU  Anesthesia Type:General  Level of Consciousness: awake and alert   Airway & Oxygen Therapy: Patient Spontanous Breathing and Patient connected to face mask oxygen  Post-op Assessment: Report given to RN  Post vital signs: Reviewed and stable  Last Vitals:  Filed Vitals:   01/25/16 1000 01/25/16 1010  BP: 103/66 96/53  Pulse:    Temp:    Resp: 14 18    Complications: No apparent anesthesia complications

## 2016-01-25 NOTE — Discharge Instructions (Signed)
Laparoscopic Tubal Ligation, Care After °Refer to this sheet in the next few weeks. These instructions provide you with information about caring for yourself after your procedure. Your health care provider may also give you more specific instructions. Your treatment has been planned according to current medical practices, but problems sometimes occur. Call your health care provider if you have any problems or questions after your procedure. °WHAT TO EXPECT AFTER THE PROCEDURE °After your procedure, it is common to have: °· Sore throat. °· Soreness at the incision site. °· Mild cramping. °· Tiredness. °· Mild nausea or vomiting. °· Shoulder pain. °HOME CARE INSTRUCTIONS °· Rest for the remainder of the day. °· Take medicines only as directed by your health care provider. These include over-the-counter medicines and prescription medicines. Do not take aspirin, which can cause bleeding. °· Over the next few days, gradually return to your normal activities and your normal diet. °· Avoid sexual intercourse for 2 weeks or as directed by your health care provider. °· Do not use tampons, and do not douche. °· Do not drive or operate heavy machinery while taking pain medicine. °· Do not lift anything that is heavier than 5 lb (2.3 kg) for 2 weeks or as directed by your health care provider. °· Do not take baths. Take showers only. Ask your health care provider when you can start taking baths. °· Take your temperature twice each day and write it down. °· Try to have help for your household needs for the first 7-10 days. °· There are many different ways to close and cover an incision, including stitches (sutures), skin glue, and adhesive strips. Follow instructions from your health care provider about: °¨ Incision care. °¨ Bandage (dressing) changes and removal. °¨ Incision closure removal. °· Check your incision area every day for signs of infection. Watch for: °¨ Redness, swelling, or pain. °¨ Fluid, blood, or pus. °· Keep  all follow-up visits as directed by your health care provider. °SEEK MEDICAL CARE IF: °· You have redness, swelling, or increasing pain in your incision area. °· You have fluid, blood, or pus coming from your incision for longer than 1 day. °· You notice a bad smell coming from your incision or your dressing. °· The edges of your incision break open after the sutures have been removed. °· Your pain does not decrease after 2-3 days. °· You have a rash. °· You repeatedly become dizzy or light-headed. °· You have a reaction to your medicine. °· Your pain medicine is not helping. °· You are constipated. °SEEK IMMEDIATE MEDICAL CARE IF: °· You have a fever. °· You faint. °· You have increasing pain in your abdomen. °· You have severe pain in one or both of your shoulders. °· You have bleeding or drainage from your suture sites or your vagina after surgery. °· You have shortness of breath or have difficulty breathing. °· You have chest pain or leg pain. °· You have ongoing nausea, vomiting, or diarrhea. °  °This information is not intended to replace advice given to you by your health care provider. Make sure you discuss any questions you have with your health care provider. °  °Document Released: 05/11/2005 Document Revised: 03/08/2015 Document Reviewed: 02/02/2012 °Elsevier Interactive Patient Education ©2016 [employer] ° °

## 2016-01-25 NOTE — Anesthesia Postprocedure Evaluation (Signed)
Anesthesia Post Note  Patient: Katie Briggs  Procedure(s) Performed: Procedure(s) (LRB): LAPAROSCOPIC BILATERAL SALPINGECTOMY (Bilateral)  Patient location during evaluation: Short Stay Anesthesia Type: General Level of consciousness: awake and alert and oriented Pain management: pain level controlled Respiratory status: spontaneous breathing Cardiovascular status: blood pressure returned to baseline Postop Assessment: no signs of nausea or vomiting Anesthetic complications: no    Last Vitals:  Filed Vitals:   01/25/16 1215 01/25/16 1218  BP: 107/74   Pulse: 58 67  Temp:    Resp: 13 15    Last Pain:  Filed Vitals:   01/25/16 1220  PainSc: 6                  Paxon Propes

## 2016-01-25 NOTE — Anesthesia Procedure Notes (Signed)
Procedure Name: Intubation Date/Time: 01/25/2016 10:52 AM Performed by: Glynn OctaveANIEL, Furqan Gosselin E Pre-anesthesia Checklist: Patient identified, Patient being monitored, Timeout performed, Emergency Drugs available and Suction available Patient Re-evaluated:Patient Re-evaluated prior to inductionOxygen Delivery Method: Circle System Utilized Preoxygenation: Pre-oxygenation with 100% oxygen Intubation Type: IV induction Ventilation: Mask ventilation without difficulty Laryngoscope Size: Mac and 3 Grade View: Grade I Tube type: Oral Tube size: 7.0 mm Number of attempts: 1 Airway Equipment and Method: Stylet Placement Confirmation: ETT inserted through vocal cords under direct vision,  positive ETCO2 and breath sounds checked- equal and bilateral Secured at: 21 cm Tube secured with: Tape Dental Injury: Teeth and Oropharynx as per pre-operative assessment

## 2016-01-25 NOTE — Anesthesia Preprocedure Evaluation (Signed)
Anesthesia Evaluation  Patient identified by MRN, date of birth, ID band Patient awake    Reviewed: Allergy & Precautions, H&P , NPO status , Patient's Chart, lab work & pertinent test results  Airway Mallampati: II  TM Distance: >3 FB Neck ROM: full    Dental  (+) Teeth Intact, Dental Advisory Given   Pulmonary asthma , former smoker,    breath sounds clear to auscultation       Cardiovascular negative cardio ROS   Rhythm:regular Rate:Normal     Neuro/Psych PSYCHIATRIC DISORDERS Anxiety Depression Bipolar Disorder    GI/Hepatic negative GI ROS,   Endo/Other    Renal/GU      Musculoskeletal   Abdominal   Peds  Hematology   Anesthesia Other Findings       Reproductive/Obstetrics (+) Pregnancy                             Anesthesia Physical Anesthesia Plan  ASA: II  Anesthesia Plan: General   Post-op Pain Management:    Induction: Intravenous  Airway Management Planned: Oral ETT  Additional Equipment:   Intra-op Plan:   Post-operative Plan: Extubation in OR  Informed Consent: I have reviewed the patients History and Physical, chart, labs and discussed the procedure including the risks, benefits and alternatives for the proposed anesthesia with the patient or authorized representative who has indicated his/her understanding and acceptance.     Plan Discussed with:   Anesthesia Plan Comments:         Anesthesia Quick Evaluation

## 2016-01-26 ENCOUNTER — Encounter (HOSPITAL_COMMUNITY): Payer: Self-pay | Admitting: Obstetrics & Gynecology

## 2016-01-26 NOTE — Addendum Note (Signed)
Addendum  created 01/26/16 0854 by Moshe SalisburyKaren E Tran Randle, CRNA   Modules edited: Charges VN

## 2016-02-03 ENCOUNTER — Ambulatory Visit (INDEPENDENT_AMBULATORY_CARE_PROVIDER_SITE_OTHER): Payer: BLUE CROSS/BLUE SHIELD | Admitting: Obstetrics & Gynecology

## 2016-02-03 ENCOUNTER — Encounter: Payer: Self-pay | Admitting: Obstetrics & Gynecology

## 2016-02-03 VITALS — BP 100/70 | HR 72 | Wt 162.0 lb

## 2016-02-03 DIAGNOSIS — Z9889 Other specified postprocedural states: Secondary | ICD-10-CM

## 2016-02-03 NOTE — Progress Notes (Signed)
Patient ID: Katie KiefMalisa R Briggs, female   DOB: 03/10/1991, 25 y.o.   MRN: [id number] Chief Complaint  Patient presents with  . Routine Post Op   Chief Complaint  Patient presents with  . Routine Post Op    Pt is 9 days post op from laparoscopic bilateral salpingectomy No problems or complaints  Incisions x 3 look good well healed  Follow up in 3 months to check up on lexapro response

## 2016-02-14 ENCOUNTER — Telehealth: Payer: Self-pay | Admitting: Adult Health

## 2016-02-14 NOTE — Telephone Encounter (Signed)
Spoke with pt letting her know Claritin and Zyrtec are safe with breastfeeding. Also, Flonase safe per Dr. Emelda FearFerguson. JSY

## 2016-03-01 ENCOUNTER — Encounter: Payer: Self-pay | Admitting: Obstetrics & Gynecology

## 2016-03-01 ENCOUNTER — Ambulatory Visit (INDEPENDENT_AMBULATORY_CARE_PROVIDER_SITE_OTHER): Payer: BLUE CROSS/BLUE SHIELD | Admitting: Obstetrics & Gynecology

## 2016-03-01 VITALS — BP 100/60 | Ht 61.0 in | Wt 162.0 lb

## 2016-03-01 DIAGNOSIS — N6002 Solitary cyst of left breast: Secondary | ICD-10-CM

## 2016-03-01 NOTE — Progress Notes (Signed)
Patient ID: Katie Briggs, female   DOB: 06/11/1991, 25 y.o.   MRN: [id number]      Chief Complaint  Patient presents with  . Breast Problem    knot in left breast    Blood pressure 100/60, height 5\' 1"  (1.549 m), weight 162 lb (73.483 kg), currently breastfeeding.  24 y.o. [id number] No LMP recorded. The current method of family planning is tubal ligation.  Subjective Pt with very small <0.5 cc smooth superficial cyst of the left breast No erythema no tenderness, not in the skin  Objective 0.5 cc cyst left ovary  Pertinent ROS   Labs or studies     Impression Diagnoses this Encounter::   ICD-9-CM ICD-10-CM   1. Breast cyst, left 610.0 N60.02     Established relevant diagnosis(es):   Plan/Recommendations: Meds ordered this encounter  Medications  . fluticasone (FLONASE) 50 MCG/ACT nasal spray    Sig: Place 2 sprays into both nostrils daily.    Labs or Scans Ordered: No orders of the defined types were placed in this encounter.    Management::   Follow up Return if symptoms worsen or fail to improve.       All questions were answered.

## 2016-05-04 ENCOUNTER — Ambulatory Visit: Payer: BLUE CROSS/BLUE SHIELD | Admitting: Obstetrics & Gynecology

## 2016-06-08 ENCOUNTER — Telehealth: Payer: Self-pay | Admitting: Obstetrics & Gynecology

## 2016-06-08 ENCOUNTER — Other Ambulatory Visit: Payer: Self-pay | Admitting: Obstetrics & Gynecology

## 2016-06-08 MED ORDER — SULFAMETHOXAZOLE-TRIMETHOPRIM 800-160 MG PO TABS: 1.0000 | ORAL_TABLET | Freq: Two times a day (BID) | ORAL | 0 refills | Status: DC

## 2016-06-08 NOTE — Telephone Encounter (Signed)
Pt called stating that she would like to speak with a nurse, Pt did not specify the reason why. Please contact pt

## 2016-06-08 NOTE — Telephone Encounter (Signed)
Pt informed Bactrim e-scribed for possible UTI.

## 2016-06-08 NOTE — Telephone Encounter (Signed)
Pt c/o pain with urination, cloudy urine, urinary urgency. Pt states AZO not helping. Pt states she does not have transportation for appt, will Dr.Eure send RX for ABX?  Pt also states she does not have a PCP.

## 2016-06-08 NOTE — Telephone Encounter (Signed)
06/27/2016 should be fine

## 2017-05-31 ENCOUNTER — Encounter (HOSPITAL_COMMUNITY): Payer: Self-pay | Admitting: Emergency Medicine

## 2017-05-31 ENCOUNTER — Emergency Department (HOSPITAL_COMMUNITY)
Admission: EM | Admit: 2017-05-31 | Discharge: 2017-06-01 | Disposition: A | Discharge status: Home / Self Care | Payer: BLUE CROSS/BLUE SHIELD | Attending: Emergency Medicine | Admitting: Emergency Medicine

## 2017-05-31 DIAGNOSIS — Z87891 Personal history of nicotine dependence: Secondary | ICD-10-CM | POA: Diagnosis not present

## 2017-05-31 DIAGNOSIS — L03116 Cellulitis of left lower limb: Secondary | ICD-10-CM | POA: Insufficient documentation

## 2017-05-31 DIAGNOSIS — J45909 Unspecified asthma, uncomplicated: Secondary | ICD-10-CM | POA: Diagnosis not present

## 2017-05-31 DIAGNOSIS — Z79899 Other long term (current) drug therapy: Secondary | ICD-10-CM | POA: Diagnosis not present

## 2017-05-31 DIAGNOSIS — R2242 Localized swelling, mass and lump, left lower limb: Secondary | ICD-10-CM | POA: Diagnosis present

## 2017-05-31 NOTE — ED Triage Notes (Signed)
Insect bite to L leg now with redness

## 2017-05-31 NOTE — ED Triage Notes (Signed)
?   Insect bite to her lower  L leg with redness

## 2017-06-01 MED ORDER — DOXYCYCLINE HYCLATE 100 MG PO TABS
100.0000 mg | ORAL_TABLET | Freq: Once | ORAL | Status: AC
  Administered 2017-06-01: 100 mg via ORAL
  Filled 2017-06-01: qty 1

## 2017-06-01 MED ORDER — DOXYCYCLINE HYCLATE 100 MG PO CAPS: 100.0000 mg | ORAL_CAPSULE | Freq: Two times a day (BID) | ORAL | 0 refills | Status: DC

## 2017-06-01 NOTE — ED Provider Notes (Signed)
[facility] Provider Note   CSN: [id number] Arrival date & time: 05/31/17  2248     History   Chief Complaint Chief Complaint  Patient presents with  . Insect Bite    x2 days    HPI Katie Briggs is a 26 y.o. female.  Patient with 2 day history of redness, pain and swelling to her left shin. She thinks this is possibly an insect bite but does not recall seeing an insect. Denies any trauma. No recent camping trips. She noticed the pain in her shin when she was kneeling down at work. She noticed a red spot in her left shin which is gotten larger over the past day. No fever. No vomiting. Denies any recent shaving. She did not try to use anything at home. She has previous scars on her legs from areas where she's been picking.   The history is provided by the patient.    Past Medical History:  Diagnosis Date  . Anemia   . Anxiety   . Asthma   . Bipolar disorder (HCC)   . Cervix prolapsed into vagina   . Depression   . Former smoker   . Itching in the vaginal area 01/09/2016  . OCD (obsessive compulsive disorder)   . Supervision of normal pregnancy in second trimester 05/11/2015    [facility] at   05/11/15 FOB Kerney Elbe 26 yo Dating By  LMP and [place] Pap  05/11/15 GC/CT Initial:                36+wks: Genetic Screen NT/IT:  CF screen  Anatomic [place]  Flu vaccine  Tdap Recommended ~ 28wks Glucose Screen  2 hr GBS  Feed Preference  Contraception  Circumcision  Childbirth Classes  Pediatrician    . Vaginal discharge 01/09/2016  . Yeast infection 01/09/2016    Patient Active Problem List   Diagnosis Date Noted  . Vaginal discharge 01/09/2016  . Itching in the vaginal area 01/09/2016  . Yeast infection 01/09/2016  . Premature rupture of membranes 11/08/2015  . Supervision of normal pregnancy 05/11/2015  . Rectal bleeding 10/28/2013  . Unspecified constipation 10/28/2013  . Obsessive compulsive disorder 03/10/2012  . Bipolar affective disorder,  depressed, severe (HCC) 03/06/2012    Class: Acute    Past Surgical History:  Procedure Laterality Date  . COLONOSCOPY N/A 11/12/2013   Procedure: COLONOSCOPY;  Surgeon: Corbin Ade, MD;  Location: [facility];  Service: Endoscopy;  Laterality: N/A;  2:40  . DILATION AND CURETTAGE OF UTERUS    . LAPAROSCOPIC BILATERAL SALPINGECTOMY Bilateral 01/25/2016   Procedure: LAPAROSCOPIC BILATERAL SALPINGECTOMY;  Surgeon: Lazaro Arms, MD;  Location: [facility];  Service: Gynecology;  Laterality: Bilateral;  . None      OB History    Gravida Para Term Preterm AB Living   4 3 3  0 1 3   SAB TAB Ectopic Multiple Live Births   1 0 0 0 3       Home Medications    Prior to Admission medications   Medication Sig Start Date End Date Taking? Authorizing Provider  escitalopram (LEXAPRO) 10 MG tablet Take 1 tablet (10 mg total) by mouth daily. 11/29/15   Lazaro Arms, MD  fluticasone (FLONASE) 50 MCG/ACT nasal spray Place 2 sprays into both nostrils daily.    [provider]  sulfamethoxazole-trimethoprim (BACTRIM DS,SEPTRA DS) 800-160 MG tablet Take 1 tablet by mouth 2 (two) times daily. 06/08/16   Duane Lope  H, MD    Family History Family History  Problem Relation Age of Onset  . Colon polyps Maternal Grandmother   . Depression Maternal Grandmother   . Depression Mother   . Hypertension Mother   . Stroke Brother        while in womb  . Seizures Brother   . Asthma Son   . Colon cancer Unknown         maternal great grandmother    Social History Social History  Substance Use Topics  . Smoking status: Former Smoker    Packs/day: 0.25    Years: 0.50    Types: Cigarettes    Quit date: 01/20/2007  . Smokeless tobacco: Never Used  . Alcohol use No     Allergies   Patient has no known allergies.   Review of Systems Review of Systems  Constitutional: Negative for activity change, appetite change, fatigue and fever.  HENT: Negative for congestion and rhinorrhea.     Respiratory: Negative for cough, chest tightness and shortness of breath.   Cardiovascular: Negative for chest pain.  Gastrointestinal: Negative for abdominal pain, nausea and vomiting.  Genitourinary: Negative for dysuria, genital sores, vaginal bleeding and vaginal discharge.  Musculoskeletal: Negative for arthralgias and myalgias.  Skin: Positive for wound.  Neurological: Negative for dizziness, weakness and headaches.   all other systems are negative except as noted in the HPI and PMH.     Physical Exam Updated Vital Signs BP 104/72 (BP Location: Right Arm)   Pulse 65   Temp 98.1 F (36.7 C) (Oral)   Resp 16   Ht 5\' 1"  (1.549 m)   Wt 72.6 kg (160 lb)   LMP 05/28/2017   SpO2 100%   BMI 30.23 kg/m   Physical Exam  Constitutional: She is oriented to person, place, and time. She appears well-developed and well-nourished. No distress.  HENT:  Head: Normocephalic and atraumatic.  Mouth/Throat: Oropharynx is clear and moist. No oropharyngeal exudate.  Eyes: Pupils are equal, round, and reactive to light. Conjunctivae and EOM are normal.  Neck: Normal range of motion. Neck supple.  No meningismus.  Cardiovascular: Normal rate, regular rhythm, normal heart sounds and intact distal pulses.   No murmur heard. Pulmonary/Chest: Effort normal and breath sounds normal. No respiratory distress.  Abdominal: Soft. There is no tenderness. There is no rebound and no guarding.  Musculoskeletal: Normal range of motion. She exhibits no edema or tenderness.  Neurological: She is alert and oriented to person, place, and time. No cranial nerve deficit. She exhibits normal muscle tone. Coordination normal.   5/5 strength throughout. CN 2-12 intact.Equal grip strength.   Skin: Skin is warm. There is erythema.  2 cm of erythema to left shin with mild induration. There is no fluctuance. There is no bleeding or drainage.  Psychiatric: She has a normal mood and affect. Her behavior is normal.   Nursing note and vitals reviewed.    ED Treatments / Results  Labs (all labs ordered are listed, but only abnormal results are displayed) Labs Reviewed - No data to display  EKG  EKG Interpretation None       Radiology No results found.  Procedures Procedures (including critical care time)  Medications Ordered in ED Medications  doxycycline (VIBRA-TABS) tablet 100 mg (not administered)     Initial Impression / Assessment and Plan / ED Course  I have reviewed the triage vital signs and the nursing notes.  Pertinent labs & imaging results that were available during  my care of the patient were reviewed by me and considered in my medical decision making (see chart for details).     Patient with early cellulitis to left shin. Possible bug bite. There is no evidence of abscess. Patient is well-appearing and nontoxic.  Patient will be treated with antibiotics, warm compresses, wound check in 2 days. Return precautions discussed including spreading redness, fever, worsening pain or any other concerns.  Final Clinical Impressions(s) / ED Diagnoses   Final diagnoses:  Cellulitis of left anterior lower leg    New Prescriptions New Prescriptions   No medications on file     Glynn Octaveancour, Malai Lady, MD 06/01/17 0114

## 2017-06-01 NOTE — Discharge Instructions (Signed)
Take antibiotics as prescribed and use warm compresses. Follow-up in the clinic for a wound check on Monday. Return to the ED sooner if the redness spreads,you develop a fever, worsening pain or any other concerns.

## 2019-03-25 DIAGNOSIS — F410 Panic disorder [episodic paroxysmal anxiety] without agoraphobia: Secondary | ICD-10-CM | POA: Insufficient documentation

## 2019-09-21 ENCOUNTER — Other Ambulatory Visit: Payer: Self-pay | Admitting: *Deleted

## 2019-09-21 DIAGNOSIS — Z20822 Contact with and (suspected) exposure to covid-19: Secondary | ICD-10-CM

## 2019-09-23 LAB — NOVEL CORONAVIRUS, NAA: SARS-CoV-2, NAA: NOT DETECTED

## 2019-12-30 ENCOUNTER — Ambulatory Visit: Payer: BC Managed Care – PPO | Attending: Internal Medicine

## 2019-12-30 DIAGNOSIS — Z20822 Contact with and (suspected) exposure to covid-19: Secondary | ICD-10-CM

## 2019-12-31 LAB — NOVEL CORONAVIRUS, NAA: SARS-CoV-2, NAA: NOT DETECTED

## 2020-01-13 ENCOUNTER — Ambulatory Visit: Payer: BC Managed Care – PPO | Attending: Internal Medicine

## 2020-01-13 DIAGNOSIS — Z20822 Contact with and (suspected) exposure to covid-19: Secondary | ICD-10-CM

## 2020-01-14 LAB — NOVEL CORONAVIRUS, NAA: SARS-CoV-2, NAA: NOT DETECTED

## 2020-01-15 ENCOUNTER — Other Ambulatory Visit: Payer: BC Managed Care – PPO

## 2020-01-20 ENCOUNTER — Ambulatory Visit: Payer: BC Managed Care – PPO | Attending: Internal Medicine

## 2020-01-20 ENCOUNTER — Ambulatory Visit: Payer: BC Managed Care – PPO

## 2020-01-20 ENCOUNTER — Other Ambulatory Visit: Payer: BC Managed Care – PPO

## 2020-01-20 DIAGNOSIS — Z20822 Contact with and (suspected) exposure to covid-19: Secondary | ICD-10-CM

## 2020-01-21 LAB — NOVEL CORONAVIRUS, NAA: SARS-CoV-2, NAA: NOT DETECTED

## 2020-01-28 ENCOUNTER — Ambulatory Visit: Payer: BC Managed Care – PPO | Attending: Internal Medicine

## 2020-01-28 DIAGNOSIS — Z23 Encounter for immunization: Secondary | ICD-10-CM

## 2020-01-28 NOTE — Progress Notes (Signed)
   Covid-19 Vaccination Clinic  Name:  KASHA HOWETH    MRN: [id number] DOB: February 16, 1991  01/28/2020  Ms. Krell was observed post Covid-19 immunization for 15 minutes without incident. She was provided with Vaccine Information Sheet and instruction to access the V-Safe system.   Ms. Mcdowell was instructed to call 911 with any severe reactions post vaccine: Marland Kitchen Difficulty breathing  . Swelling of face and throat  . A fast heartbeat  . A bad rash all over body  . Dizziness and weakness   Immunizations Administered    Name Date Dose VIS Date Route   Moderna COVID-19 Vaccine 01/28/2020 10:37 AM 0.5 mL 10/06/2019 Intramuscular   Manufacturer: Moderna   Lot: [id number]   NDC: 67014-103-01

## 2020-02-03 ENCOUNTER — Ambulatory Visit: Payer: BC Managed Care – PPO | Attending: Internal Medicine

## 2020-02-03 DIAGNOSIS — Z20822 Contact with and (suspected) exposure to covid-19: Secondary | ICD-10-CM

## 2020-02-04 LAB — SARS-COV-2, NAA 2 DAY TAT

## 2020-02-04 LAB — NOVEL CORONAVIRUS, NAA: SARS-CoV-2, NAA: NOT DETECTED

## 2020-02-10 ENCOUNTER — Other Ambulatory Visit: Payer: BC Managed Care – PPO

## 2020-03-02 ENCOUNTER — Ambulatory Visit: Payer: BC Managed Care – PPO | Attending: Internal Medicine

## 2020-03-02 DIAGNOSIS — Z23 Encounter for immunization: Secondary | ICD-10-CM

## 2020-03-02 NOTE — Progress Notes (Signed)
   Covid-19 Vaccination Clinic  Name:  Katie Briggs    MRN: [id number] DOB: [birth year]  03/02/2020  [name] was observed post Covid-19 immunization for 15 minutes without incident. She was provided with Vaccine Information Sheet and instruction to access the V-Safe system.   [name] was instructed to call 911 with any severe reactions post vaccine: Marland Kitchen Difficulty breathing  . Swelling of face and throat  . A fast heartbeat  . A bad rash all over body  . Dizziness and weakness   Immunizations Administered    Name Date Dose VIS Date Route   Moderna COVID-19 Vaccine 03/02/2020 10:03 AM 0.5 mL 10/2019 Intramuscular   Manufacturer: Moderna   Lot: [id number]   NDC: 61518-343-73

## 2020-06-12 ENCOUNTER — Other Ambulatory Visit: Payer: Self-pay

## 2020-06-12 ENCOUNTER — Ambulatory Visit (HOSPITAL_COMMUNITY)
Admission: EM | Admit: 2020-06-12 | Discharge: 2020-06-13 | Disposition: A | Discharge status: Other Acute Inpatient Hospital | Payer: BC Managed Care – PPO | Attending: Psychiatric/Mental Health | Admitting: Psychiatric/Mental Health

## 2020-06-12 DIAGNOSIS — Z915 Personal history of self-harm: Secondary | ICD-10-CM | POA: Insufficient documentation

## 2020-06-12 DIAGNOSIS — R45851 Suicidal ideations: Secondary | ICD-10-CM | POA: Diagnosis not present

## 2020-06-12 DIAGNOSIS — J45909 Unspecified asthma, uncomplicated: Secondary | ICD-10-CM | POA: Insufficient documentation

## 2020-06-12 DIAGNOSIS — F332 Major depressive disorder, recurrent severe without psychotic features: Secondary | ICD-10-CM | POA: Diagnosis not present

## 2020-06-12 DIAGNOSIS — F419 Anxiety disorder, unspecified: Secondary | ICD-10-CM | POA: Diagnosis not present

## 2020-06-12 DIAGNOSIS — Z20822 Contact with and (suspected) exposure to covid-19: Secondary | ICD-10-CM | POA: Diagnosis not present

## 2020-06-12 DIAGNOSIS — F603 Borderline personality disorder: Secondary | ICD-10-CM | POA: Diagnosis not present

## 2020-06-12 DIAGNOSIS — Z9079 Acquired absence of other genital organ(s): Secondary | ICD-10-CM | POA: Diagnosis not present

## 2020-06-12 DIAGNOSIS — Z79899 Other long term (current) drug therapy: Secondary | ICD-10-CM | POA: Diagnosis not present

## 2020-06-12 DIAGNOSIS — Z87891 Personal history of nicotine dependence: Secondary | ICD-10-CM | POA: Diagnosis not present

## 2020-06-12 DIAGNOSIS — F429 Obsessive-compulsive disorder, unspecified: Secondary | ICD-10-CM | POA: Diagnosis not present

## 2020-06-12 LAB — TSH: TSH: 1.5 u[IU]/mL (ref 0.350–4.500)

## 2020-06-12 LAB — URINALYSIS, ROUTINE W REFLEX MICROSCOPIC
Bilirubin Urine: NEGATIVE
Glucose, UA: NEGATIVE mg/dL
Hgb urine dipstick: NEGATIVE
Ketones, ur: NEGATIVE mg/dL
Leukocytes,Ua: NEGATIVE
Nitrite: NEGATIVE
Protein, ur: NEGATIVE mg/dL
Specific Gravity, Urine: 1.02 (ref 1.005–1.030)
pH: 5 (ref 5.0–8.0)

## 2020-06-12 LAB — POCT URINE DRUG SCREEN - MANUAL ENTRY (I-SCREEN)
POC Amphetamine UR: NOT DETECTED
POC Buprenorphine (BUP): NOT DETECTED
POC Cocaine UR: NOT DETECTED
POC Marijuana UR: NOT DETECTED
POC Methadone UR: NOT DETECTED
POC Methamphetamine UR: NOT DETECTED
POC Morphine: NOT DETECTED
POC Oxazepam (BZO): POSITIVE — AB
POC Oxycodone UR: NOT DETECTED
POC Secobarbital (BAR): NOT DETECTED

## 2020-06-12 LAB — COMPREHENSIVE METABOLIC PANEL
ALT: 20 U/L (ref 0–44)
AST: 17 U/L (ref 15–41)
Albumin: 3.6 g/dL (ref 3.5–5.0)
Alkaline Phosphatase: 70 U/L (ref 38–126)
Anion gap: 11 (ref 5–15)
BUN: 9 mg/dL (ref 6–20)
CO2: 25 mmol/L (ref 22–32)
Calcium: 9.3 mg/dL (ref 8.9–10.3)
Chloride: 105 mmol/L (ref 98–111)
Creatinine, Ser: 0.65 mg/dL (ref 0.44–1.00)
GFR calc Af Amer: >60 mL/min (ref >60)
GFR calc non Af Amer: >60 mL/min (ref >60)
Glucose, Bld: 93 mg/dL (ref 70–99)
Potassium: 3.8 mmol/L (ref 3.5–5.1)
Sodium: 141 mmol/L (ref 135–145)
Total Bilirubin: 0.3 mg/dL (ref 0.3–1.2)
Total Protein: 6.7 g/dL (ref 6.5–8.1)

## 2020-06-12 LAB — CBC WITH DIFFERENTIAL/PLATELET
Abs Immature Granulocytes: 0.03 10*3/uL (ref 0.00–0.07)
Basophils Absolute: 0.1 10*3/uL (ref 0.0–0.1)
Basophils Relative: 1 %
Eosinophils Absolute: 0.3 10*3/uL (ref 0.0–0.5)
Eosinophils Relative: 3 %
HCT: 42 % (ref 36.0–46.0)
Hemoglobin: 13.3 g/dL (ref 12.0–15.0)
Immature Granulocytes: 0 %
Lymphocytes Relative: 27 %
Lymphs Abs: 2.7 10*3/uL (ref 0.7–4.0)
MCH: 28.7 pg (ref 26.0–34.0)
MCHC: 31.7 g/dL (ref 30.0–36.0)
MCV: 90.5 fL (ref 80.0–100.0)
Monocytes Absolute: 0.6 10*3/uL (ref 0.1–1.0)
Monocytes Relative: 6 %
Neutro Abs: 6.3 10*3/uL (ref 1.7–7.7)
Neutrophils Relative %: 63 %
Platelets: 302 10*3/uL (ref 150–400)
RBC: 4.64 MIL/uL (ref 3.87–5.11)
RDW: 13.6 % (ref 11.5–15.5)
WBC: 10 10*3/uL (ref 4.0–10.5)
nRBC: 0 % (ref 0.0–0.2)

## 2020-06-12 LAB — HEMOGLOBIN A1C
Hgb A1c MFr Bld: 5 % (ref 4.8–5.6)
Mean Plasma Glucose: 96.8 mg/dL

## 2020-06-12 LAB — LIPID PANEL
Cholesterol: 144 mg/dL (ref 0–200)
HDL: 43 mg/dL (ref >40)
LDL Cholesterol: 75 mg/dL (ref 0–99)
Total CHOL/HDL Ratio: 3.3 RATIO
Triglycerides: 130 mg/dL (ref <150)
VLDL: 26 mg/dL (ref 0–40)

## 2020-06-12 LAB — SARS CORONAVIRUS 2 BY RT PCR (HOSPITAL ORDER, PERFORMED IN ~~LOC~~ HOSPITAL LAB): SARS Coronavirus 2: NEGATIVE

## 2020-06-12 LAB — POC SARS CORONAVIRUS 2 AG -  ED: SARS Coronavirus 2 Ag: NEGATIVE

## 2020-06-12 LAB — ETHANOL: Alcohol, Ethyl (B): <10 mg/dL (ref <10)

## 2020-06-12 LAB — PREGNANCY, URINE: Preg Test, Ur: NEGATIVE

## 2020-06-12 LAB — POCT PREGNANCY, URINE: Preg Test, Ur: NEGATIVE

## 2020-06-12 MED ORDER — TRAZODONE HCL 50 MG PO TABS: 50.0000 mg | ORAL_TABLET | Freq: Every evening | ORAL | Status: DC | PRN

## 2020-06-12 MED ORDER — ESCITALOPRAM OXALATE 10 MG PO TABS
20.0000 mg | ORAL_TABLET | Freq: Every day | ORAL | Status: DC
  Administered 2020-06-12 – 2020-06-13 (×2): 20 mg via ORAL
  Filled 2020-06-12 (×2): qty 2

## 2020-06-12 MED ORDER — ALUM & MAG HYDROXIDE-SIMETH 200-200-20 MG/5ML PO SUSP: 30.0000 mL | ORAL | Status: DC | PRN

## 2020-06-12 MED ORDER — ACETAMINOPHEN 325 MG PO TABS: 650.0000 mg | ORAL_TABLET | Freq: Four times a day (QID) | ORAL | Status: DC | PRN

## 2020-06-12 MED ORDER — ARIPIPRAZOLE 5 MG PO TABS
5.0000 mg | ORAL_TABLET | Freq: Every day | ORAL | Status: DC
  Administered 2020-06-12: 5 mg via ORAL
  Filled 2020-06-12: qty 1

## 2020-06-12 MED ORDER — TRAZODONE HCL 100 MG PO TABS
100.0000 mg | ORAL_TABLET | Freq: Every day | ORAL | Status: DC
  Administered 2020-06-12: 100 mg via ORAL
  Filled 2020-06-12: qty 1

## 2020-06-12 MED ORDER — HYDROXYZINE HCL 25 MG PO TABS
25.0000 mg | ORAL_TABLET | Freq: Three times a day (TID) | ORAL | Status: DC | PRN
  Administered 2020-06-12: 25 mg via ORAL
  Filled 2020-06-12: qty 1

## 2020-06-12 MED ORDER — MAGNESIUM HYDROXIDE 400 MG/5ML PO SUSP: 30.0000 mL | Freq: Every day | ORAL | Status: DC | PRN
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
## 2020-06-12 NOTE — ED Notes (Signed)
Pt given meal; salad, soda

## 2020-06-12 NOTE — ED Notes (Signed)
Blood draw for labs attempted x2. 1x right ac. 1x right top of hand. Failed both. Pressure bandage applied to both. No issues noted. Pt said hadn't drank any water today. Pt tolerated well. Will advise RN, so they can make attempt.

## 2020-06-12 NOTE — ED Notes (Signed)
Pt sleeping at present, no distress noted, monitoring for safety. 

## 2020-06-12 NOTE — ED Notes (Signed)
Patient did not want anything to eat. When staff asked patient how she was feeling patient responded that she has had better days. She has been resting.

## 2020-06-12 NOTE — ED Notes (Signed)
No belongings/locker. Husband took belongings home.

## 2020-06-12 NOTE — ED Notes (Signed)
Pt alert and oriented during admission process. Pt denies HI, A/VH, and endorses passive SI. Pt is cooperative and tearful. Education, support, reassurance, and encouragement provided. Pt denies any concerns at this time, and verbally contracts for safety. Pt ambulating on the unit with no issues. Pt remains safe on the unit.
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
## 2020-06-12 NOTE — ED Notes (Signed)
Pt is laying down on his bed, quite, calm and comfortable.

## 2020-06-13 ENCOUNTER — Encounter (HOSPITAL_COMMUNITY): Payer: Self-pay | Admitting: Psychiatry

## 2020-06-13 ENCOUNTER — Inpatient Hospital Stay (HOSPITAL_COMMUNITY)
Admission: AD | Admit: 2020-06-13 | Discharge: 2020-06-16 | DRG: 885 | Disposition: A | Discharge status: Home / Self Care | Payer: BC Managed Care – PPO | Source: Intra-hospital | Attending: Psychiatry | Admitting: Psychiatry

## 2020-06-13 DIAGNOSIS — F3132 Bipolar disorder, current episode depressed, moderate: Secondary | ICD-10-CM

## 2020-06-13 DIAGNOSIS — R45851 Suicidal ideations: Secondary | ICD-10-CM | POA: Diagnosis present

## 2020-06-13 DIAGNOSIS — F600 Paranoid personality disorder: Secondary | ICD-10-CM | POA: Diagnosis present

## 2020-06-13 DIAGNOSIS — Z915 Personal history of self-harm: Secondary | ICD-10-CM | POA: Diagnosis not present

## 2020-06-13 DIAGNOSIS — G4700 Insomnia, unspecified: Secondary | ICD-10-CM | POA: Diagnosis present

## 2020-06-13 DIAGNOSIS — F3181 Bipolar II disorder: Secondary | ICD-10-CM

## 2020-06-13 DIAGNOSIS — Z87891 Personal history of nicotine dependence: Secondary | ICD-10-CM

## 2020-06-13 DIAGNOSIS — Z20822 Contact with and (suspected) exposure to covid-19: Secondary | ICD-10-CM | POA: Diagnosis present

## 2020-06-13 DIAGNOSIS — K5900 Constipation, unspecified: Secondary | ICD-10-CM | POA: Diagnosis present

## 2020-06-13 DIAGNOSIS — F319 Bipolar disorder, unspecified: Secondary | ICD-10-CM | POA: Diagnosis present

## 2020-06-13 MED ORDER — ESCITALOPRAM OXALATE 20 MG PO TABS
20.0000 mg | ORAL_TABLET | Freq: Every day | ORAL | Status: DC
  Filled 2020-06-13 (×2): qty 1

## 2020-06-13 MED ORDER — ALUM & MAG HYDROXIDE-SIMETH 200-200-20 MG/5ML PO SUSP: 30.0000 mL | ORAL | Status: DC | PRN

## 2020-06-13 MED ORDER — ESCITALOPRAM OXALATE 10 MG PO TABS
10.0000 mg | ORAL_TABLET | Freq: Every day | ORAL | Status: DC
  Administered 2020-06-14 – 2020-06-15 (×2): 10 mg via ORAL
  Filled 2020-06-13 (×3): qty 1

## 2020-06-13 MED ORDER — TRAZODONE HCL 50 MG PO TABS
50.0000 mg | ORAL_TABLET | Freq: Every evening | ORAL | Status: DC | PRN
  Administered 2020-06-14 – 2020-06-15 (×2): 50 mg via ORAL
  Filled 2020-06-13 (×2): qty 1

## 2020-06-13 MED ORDER — TRAZODONE HCL 100 MG PO TABS: 100.0000 mg | ORAL_TABLET | Freq: Every evening | ORAL | Status: DC | PRN

## 2020-06-13 MED ORDER — ARIPIPRAZOLE 10 MG PO TABS
10.0000 mg | ORAL_TABLET | Freq: Every day | ORAL | Status: DC
  Administered 2020-06-14 – 2020-06-16 (×3): 10 mg via ORAL
  Filled 2020-06-13 (×5): qty 1

## 2020-06-13 MED ORDER — ARIPIPRAZOLE 5 MG PO TABS
5.0000 mg | ORAL_TABLET | Freq: Every day | ORAL | Status: DC
  Filled 2020-06-13 (×2): qty 1

## 2020-06-13 MED ORDER — HYDROXYZINE HCL 25 MG PO TABS
25.0000 mg | ORAL_TABLET | Freq: Three times a day (TID) | ORAL | Status: DC | PRN
  Administered 2020-06-14 – 2020-06-15 (×3): 25 mg via ORAL
  Filled 2020-06-13 (×3): qty 1

## 2020-06-13 MED ORDER — MAGNESIUM HYDROXIDE 400 MG/5ML PO SUSP: 30.0000 mL | Freq: Every day | ORAL | Status: DC | PRN

## 2020-06-13 MED ORDER — ACETAMINOPHEN 325 MG PO TABS
650.0000 mg | ORAL_TABLET | Freq: Four times a day (QID) | ORAL | Status: DC | PRN
  Administered 2020-06-14 – 2020-06-16 (×2): 650 mg via ORAL
  Filled 2020-06-13 (×2): qty 2

## 2020-06-13 NOTE — ED Notes (Signed)
Safe transport called 
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
## 2020-06-13 NOTE — [facility] Group Notes (Signed)
Occupational Therapy Group Note Date: 06/13/2020 Group Topic/Focus: Life Skills/Feelings Management  Group Description: Group encouraged increased participation and engagement through discussion/worksheet focused on "Breaking Down Barriers". Patients were encouraged to complete a worksheet that identified barriers to seeking treatment, asking for help, and engaging in mental health services. Discussion focused on first identifying these barriers and then brainstorming strategies to confront/challenge identified barriers.  Participation Level: Moderate   Participation Quality: Minimal Cues   Behavior: Calm, Cooperative and Guarded   Speech/Thought Process: Focused   Affect/Mood: Anxious and Constricted   Insight: Fair   Judgement: Fair   Individualization: Pt was active in her participation, though appeared guarded, anxious, and hesitant to engage verbally at times. Pt appeared to nod along in agreement with peers and identified 'stuffing feelings' and "insisting I'm okay" as barriers to treatment. She shared that she focuses on "being okay" when in reality "I'm not".   Modes of Intervention: Activity, Discussion and Education  Patient Response to Interventions:  Attentive, Engaged and Receptive   Plan: Continue to engage patient in OT groups 2 - 3x/week.  Donne Hazel, MOT, OTR/L

## 2020-06-13 NOTE — [facility] Notes (Signed)
Pt accepted to [facility] room 304-1, accepting DR. Lucianne Muss, attending Dr. Jama Flavors.  RN to RN report to [phone], adult unit ready to receive pt.
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
## 2020-06-13 NOTE — ED Notes (Signed)
meds taken without issue.  Will continue to monitor for safety on the unit 
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
## 2020-06-13 NOTE — ED Notes (Signed)
Pt. Is sleep.

## 2020-06-13 NOTE — ED Notes (Signed)
Pt given breakfast; cereal, milk 

## 2020-06-13 NOTE — ED Notes (Signed)
Report called to Alcario Drought, RN at [facility]

## 2020-06-13 NOTE — ED Notes (Signed)
Pt gave permission for staff to talk to husband. Talked to husband in lobby about possible transfer. Husband left change of clothes for pt when transferred. Clothes searched and put in brown paper bag. Locked in locker #27. Belonging sheet filled out.
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
## 2020-06-13 NOTE — ED Notes (Signed)
Pt change of clothes given to her. Changed clothes in bathroom. Escorted out back [place] to safe transport. Stable at time of transport.

## 2020-06-13 NOTE — Tx Team (Signed)
Initial Treatment Plan 06/13/2020 1:58 PM [name] JSE:[id number]    PATIENT STRESSORS: Financial difficulties Marital or family conflict   PATIENT STRENGTHS: Ability for insight Capable of independent living Communication skills   PATIENT IDENTIFIED PROBLEMS: anxiety  depression  bipolar  Family stress  Job stress             DISCHARGE CRITERIA:  Ability to meet basic life and health needs Adequate post-discharge living arrangements Improved stabilization in mood, thinking, and/or behavior  PRELIMINARY DISCHARGE PLAN: Attend aftercare/continuing care group Participate in family therapy Return to previous living arrangement  PATIENT/FAMILY INVOLVEMENT: This treatment plan has been presented to and reviewed with the patient, Katie Briggs .  The patient has been given the opportunity to ask questions and make suggestions.  Wardell Heath, RN 06/13/2020, 1:58 PM

## 2020-06-14 LAB — PROLACTIN: Prolactin: 29.8 ng/mL — ABNORMAL HIGH (ref 4.8–23.3)

## 2020-06-14 MED ORDER — MAGNESIUM HYDROXIDE 400 MG/5ML PO SUSP: 30.0000 mL | Freq: Every day | ORAL | Status: DC | PRN

## 2020-06-14 NOTE — Progress Notes (Signed)
[facility] MD Progress Note  06/14/2020 1:04 PM Katie Briggs  MRN:  [id number] Subjective: Pt is seen and examined today. Pt states she still feels depressed. Pt states she has suicidal ideations off and on but currently denies any suicidal or homicidal ideations.. Pt states she didn't sleep well last night and was tossing and turning all night. Pt states she has low appetite and she just eats because she has to eat. Pt states she feels like screaming loudly to remove her frustration. Pt states her husband wants her to come home and she feels comfortable going back there. Pt denies any side effects from medications. Pt  c/o headache and states it is because she didn't have coffee. Pt denies any nausea, vomiting, dizziness, chest pain, SOB, abdominal pain, and diarrhea. Pt c/o mild constipation.  Objective : Pt is a 29 year old female with a past history of Bipolar disorder presented to the ED voluntarily on 06/12/2020 for worsening depression and suicidal ideations.   Principal Problem: Bipolar 2 disorder, major depressive episode ([place]) Diagnosis: Principal Problem:   Bipolar 2 disorder, major depressive episode ([place]) Active Problems:   Bipolar disorder, unspecified ([place])  Total Time spent with patient: 20 minutes  Past Psychiatric History: One prior psychiatric admission back in 2013, at which time presented for depression in the context of family stressors, DSS involvement . History of suicide attempt by overdosing about 10 years ago. At the time she was discharged on Risperidone and Zoloft . States that Zoloft was not helpful. Risperidone was helpful but caused weight gain.  She reports she has been diagnosed with Bipolar Disorder in the past.  Past Medical History:  Past Medical History:  Diagnosis Date  . Anemia   . Anxiety   . Asthma   . Bipolar disorder ([place])   . Cervix prolapsed into vagina   . Depression   . Former smoker   . Itching in the vaginal area 01/09/2016  . OCD (obsessive  compulsive disorder)   . Supervision of normal pregnancy in second trimester 05/11/2015    [place] Initiated Care at   05/11/15 FOB Dominica Severin 29 yo Dating By  LMP and [place] Pap  05/11/15 GC/CT Initial:                36+wks: Genetic Screen NT/IT:  CF screen  Anatomic [place]  Flu vaccine  Tdap Recommended ~ 28wks Glucose Screen  2 hr GBS  Feed Preference  Contraception  Circumcision  Childbirth Classes  Pediatrician    . Vaginal discharge 01/09/2016  . Yeast infection 01/09/2016    Past Surgical History:  Procedure Laterality Date  . COLONOSCOPY N/A 11/12/2013   Procedure: COLONOSCOPY;  Surgeon: Daneil Dolin, MD;  Location: [facility] ENDO SUITE;  Service: Endoscopy;  Laterality: N/A;  2:40  . DILATION AND CURETTAGE OF UTERUS    . LAPAROSCOPIC BILATERAL SALPINGECTOMY Bilateral 01/25/2016   Procedure: LAPAROSCOPIC BILATERAL SALPINGECTOMY;  Surgeon: Florian Buff, MD;  Location: [facility] ORS;  Service: Gynecology;  Laterality: Bilateral;  . None    . TUBAL LIGATION     salpingectomy-tubes removed   Family History:  Family History  Problem Relation Age of Onset  . Colon polyps Maternal Grandmother   . Depression Maternal Grandmother   . Depression Mother   . Hypertension Mother   . Stroke Brother        while in womb  . Seizures Brother   . Asthma Son   . Colon cancer Other  maternal great grandmother   Family Psychiatric  History: reports mother , sister , aunt have history of depression. No known suicides in family. Social History:  Social History   Substance and Sexual Activity  Alcohol Use No     Social History   Substance and Sexual Activity  Drug Use No    Social History   Socioeconomic History  . Marital status: Married    Spouse name: Not on file  . Number of children: 3  . Years of education: 78  . Highest education level: 12th grade  Occupational History  . Not on file  Tobacco Use  . Smoking status: Former Smoker    Packs/day: 0.25    Years: 0.50    Pack  years: 0.12    Types: Cigarettes    Quit date: 01/20/2007    Years since quitting: 13.4  . Smokeless tobacco: Never Used  Substance and Sexual Activity  . Alcohol use: No  . Drug use: No  . Sexual activity: Yes    Birth control/protection: None, Condom  Other Topics Concern  . Not on file  Social History Narrative  . Not on file   Social Determinants of Health   Financial Resource Strain:   . Difficulty of Paying Living Expenses:   Food Insecurity:   . Worried About Charity fundraiser in the Last Year:   . Arboriculturist in the Last Year:   Transportation Needs:   . Film/video editor (Medical):   Marland Kitchen Lack of Transportation (Non-Medical):   Physical Activity:   . Days of Exercise per Week:   . Minutes of Exercise per Session:   Stress:   . Feeling of Stress :   Social Connections:   . Frequency of Communication with Friends and Family:   . Frequency of Social Gatherings with Friends and Family:   . Attends Religious Services:   . Active Member of Clubs or Organizations:   . Attends Archivist Meetings:   Marland Kitchen Marital Status:    Additional Social History:                         Sleep: Poor  Appetite:  Fair  Current Medications: Current Facility-Administered Medications  Medication Dose Route Frequency Provider Last Rate Last Admin  . acetaminophen (TYLENOL) tablet 650 mg  650 mg Oral Q6H PRN Money, Lowry Ram, FNP      . ARIPiprazole (ABILIFY) tablet 10 mg  10 mg Oral Daily Cobos, Myer Peer, MD   10 mg at 06/14/20 0815  . escitalopram (LEXAPRO) tablet 10 mg  10 mg Oral Daily Cobos, Myer Peer, MD   10 mg at 06/14/20 0815  . hydrOXYzine (ATARAX/VISTARIL) tablet 25 mg  25 mg Oral TID PRN Money, Lowry Ram, FNP   25 mg at 06/14/20 0815  . traZODone (DESYREL) tablet 50 mg  50 mg Oral QHS PRN Cobos, Myer Peer, MD        Lab Results:  Results for orders placed or performed during the hospital encounter of 06/12/20 (from the past 48 hour(s))  POC  SARS Coronavirus 2 Ag-ED - Nasal Swab (BD Veritor Kit)     Status: Normal   Collection Time: 06/12/20  3:29 PM  Result Value Ref Range   SARS Coronavirus 2 Ag Negative Negative  SARS Coronavirus 2 by RT PCR (hospital order, performed in [facility] hospital lab) Nasopharyngeal Nasopharyngeal Swab     Status: None  Collection Time: 06/12/20  3:30 PM   Specimen: Nasopharyngeal Swab  Result Value Ref Range   SARS Coronavirus 2 NEGATIVE NEGATIVE    Comment: (NOTE) SARS-CoV-2 target nucleic acids are NOT DETECTED.  The SARS-CoV-2 RNA is generally detectable in upper and lower respiratory specimens during the acute phase of infection. The lowest concentration of SARS-CoV-2 viral copies this assay can detect is 250 copies / mL. A negative result does not preclude SARS-CoV-2 infection and should not be used as the sole basis for treatment or other patient management decisions.  A negative result may occur with improper specimen collection / handling, submission of specimen other than nasopharyngeal swab, presence of viral mutation(s) within the areas targeted by this assay, and inadequate number of viral copies (<250 copies / mL). A negative result must be combined with clinical observations, patient history, and epidemiological information.  Fact Sheet for Patients:   StrictlyIdeas.no  Fact Sheet for Healthcare Providers: BankingDealers.co.za  This test is not yet approved or  cleared by the [place] FDA and has been authorized for detection and/or diagnosis of SARS-CoV-2 by FDA under an Emergency Use Authorization (EUA).  This EUA will remain in effect (meaning this test can be used) for the duration of the COVID-19 declaration under Section 564(b)(1) of the Act, 21 U.S.C. section 360bbb-3(b)(1), unless the authorization is terminated or revoked sooner.  Performed at [facility], [place] [address] [place], [place] [address]    POCT Urine Drug Screen - (ICup)     Status: Abnormal   Collection Time: 06/12/20  3:36 PM  Result Value Ref Range   POC Amphetamine UR None Detected None Detected   POC Secobarbital (BAR) None Detected None Detected   POC Buprenorphine (BUP) None Detected None Detected   POC Oxazepam (BZO) Positive (A) None Detected   POC Cocaine UR None Detected None Detected   POC Methamphetamine UR None Detected None Detected   POC Morphine None Detected None Detected   POC Oxycodone UR None Detected None Detected   POC Methadone UR None Detected None Detected   POC Marijuana UR None Detected None Detected  Pregnancy, urine POC     Status: None   Collection Time: 06/12/20  3:56 PM  Result Value Ref Range   Preg Test, Ur NEGATIVE NEGATIVE    Comment:        THE SENSITIVITY OF THIS METHODOLOGY IS >24 mIU/mL   Urinalysis, Routine w reflex microscopic     Status: None   Collection Time: 06/12/20  5:33 PM  Result Value Ref Range   Color, Urine YELLOW YELLOW   APPearance CLEAR CLEAR   Specific Gravity, Urine 1.020 1.005 - 1.030   pH 5.0 5.0 - 8.0   Glucose, UA NEGATIVE NEGATIVE mg/dL   Hgb urine dipstick NEGATIVE NEGATIVE   Bilirubin Urine NEGATIVE NEGATIVE   Ketones, ur NEGATIVE NEGATIVE mg/dL   Protein, ur NEGATIVE NEGATIVE mg/dL   Nitrite NEGATIVE NEGATIVE   Leukocytes,Ua NEGATIVE NEGATIVE    Comment: Performed at [facility], [address] [place], [place] [address]  Pregnancy, urine     Status: None   Collection Time: 06/12/20  5:33 PM  Result Value Ref Range   Preg Test, Ur NEGATIVE NEGATIVE    Comment:        THE SENSITIVITY OF THIS METHODOLOGY IS >20 mIU/mL. Performed at [facility], [place] [address], [place], [place] [address]   CBC with Differential/Platelet     Status: None  Collection Time: 06/12/20  8:00 PM  Result Value Ref Range   WBC 10.0 4.0 - 10.5 K/uL   RBC 4.64 3.87 - 5.11 MIL/uL   Hemoglobin 13.3 12.0 - 15.0 g/dL   HCT 42.0 36 - 46 %   MCV 90.5  80.0 - 100.0 fL   MCH 28.7 26.0 - 34.0 pg   MCHC 31.7 30.0 - 36.0 g/dL   RDW 13.6 11.5 - 15.5 %   Platelets 302 150 - 400 K/uL   nRBC 0.0 0.0 - 0.2 %   Neutrophils Relative % 63 %   Neutro Abs 6.3 1.7 - 7.7 K/uL   Lymphocytes Relative 27 %   Lymphs Abs 2.7 0.7 - 4.0 K/uL   Monocytes Relative 6 %   Monocytes Absolute 0.6 0 - 1 K/uL   Eosinophils Relative 3 %   Eosinophils Absolute 0.3 0 - 0 K/uL   Basophils Relative 1 %   Basophils Absolute 0.1 0 - 0 K/uL   Immature Granulocytes 0 %   Abs Immature Granulocytes 0.03 0.00 - 0.07 K/uL    Comment: Performed at [facility], [address] [place], [place] [address]  Comprehensive metabolic panel     Status: None   Collection Time: 06/12/20  8:00 PM  Result Value Ref Range   Sodium 141 135 - 145 mmol/L   Potassium 3.8 3.5 - 5.1 mmol/L   Chloride 105 98 - 111 mmol/L   CO2 25 22 - 32 mmol/L   Glucose, Bld 93 70 - 99 mg/dL    Comment: Glucose reference range applies only to samples taken after fasting for at least 8 hours.   BUN 9 6 - 20 mg/dL   Creatinine, Ser 0.65 0.44 - 1.00 mg/dL   Calcium 9.3 8.9 - 10.3 mg/dL   Total Protein 6.7 6.5 - 8.1 g/dL   Albumin 3.6 3.5 - 5.0 g/dL   AST 17 15 - 41 U/L   ALT 20 0 - 44 U/L   Alkaline Phosphatase 70 38 - 126 U/L   Total Bilirubin 0.3 0.3 - 1.2 mg/dL   GFR calc non Af Amer >60 >60 mL/min   GFR calc Af Amer >60 >60 mL/min   Anion gap 11 5 - 15    Comment: Performed at [facility], [place] [address], [place], [place] [address]  Ethanol     Status: None   Collection Time: 06/12/20  8:00 PM  Result Value Ref Range   Alcohol, Ethyl (B) <10 <10 mg/dL    Comment: (NOTE) Lowest detectable limit for serum alcohol is 10 mg/dL.  For medical purposes only. Performed at [facility], [place] [address], [place], [place] [address]   Prolactin     Status: Abnormal   Collection Time: 06/12/20  8:00 PM  Result Value Ref Range   Prolactin 29.8 (H) 4.8 - 23.3 ng/mL    Comment:  (NOTE) Performed At: [facility] [place], [place] [id number] Rush Farmer MD [id number]   Hemoglobin A1c     Status: None   Collection Time: 06/12/20  8:00 PM  Result Value Ref Range   Hgb A1c MFr Bld 5.0 4.8 - 5.6 %    Comment: (NOTE) Pre diabetes:          5.7%-6.4%  Diabetes:              >6.4%  Glycemic control for   <7.0% adults with diabetes    Mean Plasma Glucose  96.8 mg/dL    Comment: Performed at [facility], [place] [address], [place], [place] [address]  Lipid panel     Status: None   Collection Time: 06/12/20  8:00 PM  Result Value Ref Range   Cholesterol 144 0 - 200 mg/dL   Triglycerides 130 <150 mg/dL   HDL 43 >40 mg/dL   Total CHOL/HDL Ratio 3.3 RATIO   VLDL 26 0 - 40 mg/dL   LDL Cholesterol 75 0 - 99 mg/dL    Comment:        Total Cholesterol/HDL:CHD Risk Coronary Heart Disease Risk Table                     Men   Women  1/2 Average Risk   3.4   3.3  Average Risk       5.0   4.4  2 X Average Risk   9.6   7.1  3 X Average Risk  23.4   11.0        Use the calculated Patient Ratio above and the CHD Risk Table to determine the patient's CHD Risk.        ATP III CLASSIFICATION (LDL):  <100     mg/dL   Optimal  100-129  mg/dL   Near or Above                    Optimal  130-159  mg/dL   Borderline  160-189  mg/dL   High  >190     mg/dL   Very High Performed at [place] [address] [place], [place] [address]   TSH     Status: None   Collection Time: 06/12/20  8:00 PM  Result Value Ref Range   TSH 1.500 0.350 - 4.500 uIU/mL    Comment: Performed by a 3rd Generation assay with a functional sensitivity of <=0.01 uIU/mL. Performed at [facility], [place] [address], [place],  [address]     Blood Alcohol level:  Lab Results  Component Value Date   [facility] <10 06/12/2020   ETH <11 37/02/8888    Metabolic Disorder Labs: Lab Results  Component Value Date   HGBA1C 5.0 06/12/2020   MPG 96.8  06/12/2020   Lab Results  Component Value Date   PROLACTIN 29.8 (H) 06/12/2020   Lab Results  Component Value Date   CHOL 144 06/12/2020   TRIG 130 06/12/2020   HDL 43 06/12/2020   CHOLHDL 3.3 06/12/2020   VLDL 26 06/12/2020   LDLCALC 75 06/12/2020    Physical Findings: AIMS:  , ,  ,  ,    CIWA:    COWS:     Musculoskeletal: Strength & Muscle Tone: within normal limits Gait & Station: normal Patient leans: N/A  Psychiatric Specialty Exam: Physical Exam Constitutional:      General: She is not in acute distress.    Appearance: Normal appearance. She is not ill-appearing, toxic-appearing or diaphoretic.  HENT:     Head: Normocephalic and atraumatic.  Pulmonary:     Effort: Pulmonary effort is normal.  Neurological:     Mental Status: She is alert.     Review of Systems  Constitutional: Positive for appetite change. Negative for activity change, fatigue and fever.  Respiratory: Negative for chest tightness and shortness of breath.   Gastrointestinal: Positive for constipation. Negative for abdominal pain and diarrhea.  Neurological: Positive for headaches. Negative for dizziness and light-headedness.  Psychiatric/Behavioral: Positive for dysphoric mood. Negative for agitation, hallucinations and suicidal ideas. The patient is nervous/anxious.     Blood pressure 113/78, pulse 97, temperature 97.8 F (36.6 C), temperature source Oral, resp. rate 18, height 5' 1"  (1.549 m), weight 78.5 kg, SpO2 99 %, currently breastfeeding.Body mass index is 32.69 kg/m.  General Appearance: Casual  Eye Contact:  Fair  Speech:  Normal Rate  Volume:  Normal  Mood:  Anxious and Depressed  Affect:  Constricted  Thought Process:  Linear and Descriptions of Associations: Intact  Orientation:  Full (Time, Place, and Person)  Thought Content:  Delusions and Hallucinations: None  Suicidal Thoughts:  No  Homicidal Thoughts:  No  Memory:  Immediate;   Good Recent;   Good Remote;   Good   Judgement:  Fair  Insight:  Fair  Psychomotor Activity:  Normal  Concentration:  Concentration: Good and Attention Span: Good  Recall:  Good  Fund of Knowledge:  Good  Language:  Good  Akathisia:  Negative  Handed:  Right  AIMS (if indicated):     Assets:  Desire for Improvement Resilience  ADL's:  Intact  Cognition:  WNL  Sleep:        Treatment Plan Summary:Pt is admitted with the above mentioned psychiatric history. BP- 113/78 mmHG, PR- 97/min Daily contact with patient to assess and evaluate symptoms and progress in treatment Monitor Vitals. -Monitor for suicidal ideations. -Monitor for medication Side effects. -Add Milk of Magnesia 30 g PRN for constipation. -Continue Lexopro 10 mg Daily -Continue  Abilify to 10 mg Daily -Continue Trazodone 50 mg for Sleep PRN -Continue Vistaril 25 mg TID PRN  Armando Reichert, MD 06/14/2020, 1:04 PM

## 2020-06-14 NOTE — [facility] Suicide Risk Assessment (Signed)
[facility] INPATIENT:  Family/Significant Other Suicide Prevention Education  Suicide Prevention Education:  Education Completed; Srinidhi Landers Husband,  (name of family member/significant other) has been identified by the patient as the family member/significant other with whom the patient will be residing, and identified as the person(s) who will aid the patient in the event of a mental health crisis (suicidal ideations/suicide attempt).  With written consent from the patient, the family member/significant other has been provided the following suicide prevention education, prior to the and/or following the discharge of the patient.  The suicide prevention education provided includes the following:  Suicide risk factors  Suicide prevention and interventions  National Suicide Hotline telephone number  [facility] assessment telephone number  [facility] Emergency Assistance 911  Ambulatory Care Center and/or Residential Mobile Crisis Unit telephone number  Request made of family/significant other to:  Remove weapons (e.g., guns, rifles, knives), all items previously/currently identified as safety concern.    Remove drugs/medications (over-the-counter, prescriptions, illicit drugs), all items previously/currently identified as a safety concern.  The family member/significant other verbalizes understanding of the suicide prevention education information provided.  The family member/significant other agrees to remove the items of safety concern listed above.  Erin Sons 06/14/2020, 4:04 PM

## 2020-06-14 NOTE — Progress Notes (Signed)
Adult Psychoeducational Group Note  Date:  06/14/2020 Time:  11:13 PM  Group Topic/Focus:  Wrap-Up Group:   The focus of this group is to help patients review their daily goal of treatment and discuss progress on daily workbooks.  Participation Level:  Active  Participation Quality:  Appropriate  Affect:  Appropriate  Cognitive:  Alert  Insight: Improving  Engagement in Group:  Developing/Improving  Modes of Intervention:  Education and Support  Additional Comments:  Katie Briggs was active in the group discussion. According to her, " I have depression and anxiety". "I have hope that I will get the best help here."  [name] 06/14/2020, 11:13 PM

## 2020-06-14 NOTE — Progress Notes (Signed)
   06/14/20 2120  Psych Admission Type (Psych Patients Only)  Admission Status Voluntary  Psychosocial Assessment  Patient Complaints None  Eye Contact Fair  Facial Expression Flat  Affect Anxious  Speech Logical/coherent  Interaction Assertive  Motor Activity Other (Comment) (wnl)  Appearance/Hygiene Unremarkable  Behavior Characteristics Cooperative  Mood Pleasant;Depressed  Thought Process  Coherency WDL  Content WDL  Delusions None reported or observed  Perception WDL  Hallucination None reported or observed  Judgment Poor  Confusion None  Danger to Self  Current suicidal ideation? Denies  Danger to Others  Danger to Others None reported or observed   Pt states that the trazodone prescribed helps her fall asleep but she doesn't stay asleep. Hasn't been sleeping well. Wants to try some other medication for sleep. Takes Trazodone 100 mg at home and that still doesn't help.

## 2020-06-14 NOTE — Progress Notes (Signed)
Cooperative with treatment on shift she reports depression and SI. She has been quite and currently resting in bed, She had no no behavioral issues to report on shift at this time.

## 2020-06-14 NOTE — Progress Notes (Signed)
Pt reported that she did not sleep "at all" last night.  Rated anxiety at an 8 out of 10 and depression as a 10 out of 10 with 10 being the highest.  Pt presents as irritable, with flat affect.  When RN asked pt if she was having thoughts of hurting herself, pt responded, "not right now, I'm just very depressed."    RN worked on Pharmacologist with pt and assessed for needs/concerns.  RN administered medications per provider orders.  RN provided encouragement.    Pt declined to go to mental health group after lunch.  Pt has been spending a lot of time on the phone or lying down in bed in her room.  No adverse reactions from pt's medications were noted.    RN will continue to monitor and provide support as needed. q 15 min safety checks remain in place.

## 2020-06-14 NOTE — Progress Notes (Signed)
Recreation Therapy Notes  Animal-Assisted Activity (AAA) Program Checklist/Progress Notes Patient Eligibility Criteria Checklist & Daily Group note for Rec Tx Intervention  Date: 8.10.21 Time: 1430 Location: 300 Hall Dayroom   AAA/T Program Assumption of Risk Form signed by Patient/ or Parent Legal Guardian  YES  Patient is free of allergies or sever asthma  YES  Patient reports no fear of animals  YES  Patient reports no history of cruelty to animals YES   Patient understands his/her participation is voluntary YES   Patient washes hands before animal contact  YES   Patient washes hands after animal contact  YES  Behavioral Response: Engaged  Education: Hand Washing, Appropriate Animal Interaction   Education Outcome: Acknowledges understanding/In group clarification offered/Needs additional education.   Clinical Observations/Feedback: Pt attended and participated in activity.     Aziz Slape, LRT/CTRS         Romano Stigger A 06/14/2020 3:24 PM 

## 2020-06-14 NOTE — [facility] Counselor (Signed)
Adult Comprehensive Assessment  Patient ID: Katie Briggs, female   DOB: 07-06-1991, 29 y.o.   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 Has 3 kids total, one lives with mom. Mom and dad are Tourist information centre manager / Lack of resources (include bankruptcy): n/a Housing / Lack of housing: Lives in trailer with husband and 2 kids in [place] Physical health (include injuries & life threatening diseases): denies Social relationships: reports she doesn't have many friends. Substance abuse: states she smoked weed once 1 month ago otherwise denies any other use Bereavement / Loss: n/a  Living/Environment/Situation:  Living Arrangements: Spouse/significant other, Children Living conditions (as described by patient or guardian): "cramped" Who else lives in the home?: husband 2 kids How long has patient lived in current situation?: 6 months What is atmosphere in current home: Comfortable  Family History:  Marital status: Married Number of Years Married: 11 What types of issues is patient dealing with in the relationship?: pt did not want to share about issues Are you sexually active?: Yes What is your sexual orientation?: patient states that she is bisexual Has your sexual activity been affected by drugs, alcohol, medication, or emotional stress?: not assessed Does patient have children?: Yes How many children?: 3 How is patient's relationship with their children?: "Good"  Childhood History:  By whom was/is the patient raised?: Both parents Description of patient's relationship with caregiver when they were a child: During this  assessment pt reports strained relationship with both parents. States "we butted heads a whole lot" Patient's description of current relationship with people who raised him/her: reports "good" How were you disciplined when you got in trouble as a child/adolescent?: whooped with hand or belt Does patient have siblings?: Yes Number of Siblings: 4 Description of patient's current relationship with siblings: 4 half siblings. Only in touch with 2 Did patient suffer any verbal/emotional/physical/sexual abuse as a child?: No Did patient suffer from severe childhood neglect?: No Has patient ever been sexually abused/assaulted/raped as an adolescent or adult?: No Was the patient ever a victim of a crime or a disaster?: No Witnessed domestic violence?: No Has patient been affected by domestic violence as an adult?: No Description of domestic violence: pt denies during this assessment  Education:  Highest grade of school patient has completed: High school diploma Currently a student?: No Learning disability?: No  Employment/Work Situation:   Employment situation: 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 Patient's job has been impacted by current illness: Yes Describe how patient's job has been impacted: being in [facility] What is the longest time patient has a held a job?: patient states that she worked at [employer] x 5 yrs Where was the patient employed at that time?: [employer] Has patient ever been in the [employer]?: No  Financial Resources:   Financial resources: Income from employment Does patient have a representative payee or guardian?: No  Alcohol/Substance Abuse:   What has been your use of drugs/alcohol within the last 12 months?: mariujuana 1 time If attempted suicide, did drugs/alcohol play a role in this?: No Alcohol/Substance Abuse Treatment Hx: Denies past history Has alcohol/substance abuse ever caused legal  problems?:  No  Social Support System:   Forensic psychologist System: Production assistant, radio System: husband and parents Type of faith/religion: "no"  Leisure/Recreation:   Do You Have Hobbies?: Yes Leisure and Hobbies: "sleeping"  Strengths/Needs:   What is the patient's perception of their strengths?: "good worker" Patient states they can use these personal strengths during their treatment to contribute to their recovery: did not asssess Patient states these barriers may affect/interfere with their treatment: none reported Patient states these barriers may affect their return to the community: none reported  Discharge Plan:   Currently receiving community mental health services: No Patient states concerns and preferences for aftercare planning are: Pt reports she does not want [facility] or [facility]. Explained that they required her to do group therapy. She is okay with individual therapy but not group. Patient states they will know when they are safe and ready for discharge when: "When I'm not having suicidal thoughts" Does patient have access to transportation?: Yes (husband) Does patient have financial barriers related to discharge medications?: No Will patient be returning to same living situation after discharge?: Yes  Summary/Recommendations:   Pt is a 29 year old female with a past history of Bipolar disorder presented to the ED voluntarily on 06/12/2020 for worsening depression and suicidal ideations.  Pt states she has been having these passive suicidal thoughts lately and describes as she would be better dead. She had thoughts of driving her car off road. Pt reports feeling depressed for about 4 months which has been worsening recently because of relationship stress with her husband. Pt states she has an open relationship with her husband and she is also seeing another man and now feels trapped in having to choose between her husband or this other man. Reports her husband has  threatened to " take the kids away from me if we separate".  She states that someone hacked her phone and had shared private photos. Pt states her coworker knew about this which had made her embarrassed at work. She also states that she feels that " I am being watched at work", which she attributes to coworkers knowing about her marital issues. Pt reports that her marital and social stressors are contributing to increased symptoms.    Pt is guarded and flat during PSA. She avoids going into detail about relationship with husband. Pt does not want to go to [facility] or [facility] for outpatient services due to being required to do group therapy when she was with them. She is open to both individual therapy and medication management. Pt plans to return home with husband in [place]. She is okay with referrals in [place].  Recommendations: Patient will benefit from crisis stabilization, medication evaluation, group therapy and psychoeducation, in addition to case management for discharge planning. At discharge it is recommended that Patient adhere to the established discharge plan and continue in treatment. Anticipated Outcomes: Mood will be stabilized, crisis will be stabilized, medications will be established if appropriate, coping skills will be taught and practiced, family session will be done to determine discharge plan, mental illness will be normalized, patient will be better equipped to recognize symptoms and ask for assistance. Daimien Patmon P Namiah Dunnavant. 06/14/2020

## 2020-06-15 DIAGNOSIS — F3181 Bipolar II disorder: Principal | ICD-10-CM

## 2020-06-15 MED ORDER — ESCITALOPRAM OXALATE 20 MG PO TABS
20.0000 mg | ORAL_TABLET | Freq: Every day | ORAL | Status: DC
  Administered 2020-06-16: 20 mg via ORAL
  Filled 2020-06-15 (×3): qty 1

## 2020-06-15 NOTE — Progress Notes (Signed)
D:  Patient denied SI and HI, contracts for safety.  Denied A/V hallucinations.   A:  Medications administered per MD orders.  Emotional support and encouragement given patient. R:  Safety maintained with 15 minute checks.  

## 2020-06-15 NOTE — Progress Notes (Signed)
[facility] MD Progress Note  06/15/2020 11:03 AM Katie Briggs  MRN:  [id number] Subjective: Pt is seen and examined today. Pt states her mood is little better than yesterday. Pt states she is tired as she didn't sleep well last night. Pt states her appetite is ok. Pt states she talked to her husband and he wants her back in home. Pt denies any side effects from medications. Pt denies any nausea, vomiting, dizziness, chest pain, SOB, abdominal pain, and diarrhea. Pt denies any suicidal ideation, homicidal ideation and visual and auditory hallucinations. Pt denies any side effects from medications. On examination, Pt is anxious, cooperative and oriented x4. Pt's speech is normal and volume is normal. Pt's mood is dysphoric and affects is constricted. Objective : Pt is a 29 year old female with a past history of Bipolar disorder presented to the ED voluntarily on 06/12/2020 for worsening depression and suicidal ideations.   Principal Problem: Bipolar 2 disorder, major depressive episode (HCC) Diagnosis: Principal Problem:   Bipolar 2 disorder, major depressive episode (HCC) Active Problems:   Bipolar disorder, unspecified (HCC)  Total Time spent with patient: 20 minutes  Past Psychiatric History: One prior psychiatric admission back in 2013, at which time presented for depression in the context of family stressors, DSS involvement . History of suicide attempt by overdosing about 10 years ago. At the time she was discharged on Risperidone and Zoloft . States that Zoloft was not helpful. Risperidone was helpful but caused weight gain.  She reports she has been diagnosed with Bipolar Disorder in the past.   Past Medical History:  Past Medical History:  Diagnosis Date   Anemia    Anxiety    Asthma    Bipolar disorder (HCC)    Cervix prolapsed into vagina    Depression    Former smoker    Itching in the vaginal area 01/09/2016   OCD (obsessive compulsive disorder)    Supervision of normal  pregnancy in second trimester 05/11/2015    [facility]iated Care at   05/11/15 FOB Kerney Elbe 29 yo Dating By  LMP and [place] Pap  05/11/15 GC/CT Initial:                36+wks: Genetic Screen NT/IT:  CF screen  Anatomic [place]  Flu vaccine  Tdap Recommended ~ 28wks Glucose Screen  2 hr GBS  Feed Preference  Contraception  Circumcision  Childbirth Classes  Pediatrician     Vaginal discharge 01/09/2016   Yeast infection 01/09/2016    Past Surgical History:  Procedure Laterality Date   COLONOSCOPY N/A 11/12/2013   Procedure: COLONOSCOPY;  Surgeon: Corbin Ade, MD;  Location: [facility];  Service: Endoscopy;  Laterality: N/A;  2:40   DILATION AND CURETTAGE OF UTERUS     LAPAROSCOPIC BILATERAL SALPINGECTOMY Bilateral 01/25/2016   Procedure: LAPAROSCOPIC BILATERAL SALPINGECTOMY;  Surgeon: Lazaro Arms, MD;  Location: [facility];  Service: Gynecology;  Laterality: Bilateral;   None     TUBAL LIGATION     salpingectomy-tubes removed   Family History:  Family History  Problem Relation Age of Onset   Colon polyps Maternal Grandmother    Depression Maternal Grandmother    Depression Mother    Hypertension Mother    Stroke Brother        while in womb   Seizures Brother    Asthma Son    Colon cancer Other         maternal great grandmother  Family Psychiatric  History: reports mother , sister , aunt have history of depression. No known suicides in family. Social History:  Social History   Substance and Sexual Activity  Alcohol Use No     Social History   Substance and Sexual Activity  Drug Use No    Social History   Socioeconomic History   Marital status: Married    Spouse name: Not on file   Number of children: 3   Years of education: 12   Highest education level: 12th grade  Occupational History   Not on file  Tobacco Use   Smoking status: Former Smoker    Packs/day: 0.25    Years: 0.50    Pack years: 0.12    Types: Cigarettes    Quit date:  01/20/2007    Years since quitting: 13.4   Smokeless tobacco: Never Used  Substance and Sexual Activity   Alcohol use: No   Drug use: No   Sexual activity: Yes    Birth control/protection: None, Condom  Other Topics Concern   Not on file  Social History Narrative   Not on file   Social Determinants of Health   Financial Resource Strain:    Difficulty of Paying Living Expenses:   Food Insecurity:    Worried About Programme researcher, broadcasting/film/video in the Last Year:    Barista in the Last Year:   Transportation Needs:    Freight forwarder (Medical):    Lack of Transportation (Non-Medical):   Physical Activity:    Days of Exercise per Week:    Minutes of Exercise per Session:   Stress:    Feeling of Stress :   Social Connections:    Frequency of Communication with Friends and Family:    Frequency of Social Gatherings with Friends and Family:    Attends Religious Services:    Active Member of Clubs or Organizations:    Attends Banker Meetings:    Marital Status:    Additional Social History:                         Sleep: Poor  Appetite:  Fair  Current Medications: Current Facility-Administered Medications  Medication Dose Route Frequency Provider Last Rate Last Admin   acetaminophen (TYLENOL) tablet 650 mg  650 mg Oral Q6H PRN Money, Gerlene Burdock, FNP   650 mg at 06/14/20 2121   ARIPiprazole (ABILIFY) tablet 10 mg  10 mg Oral Daily Cobos, Rockey Situ, MD   10 mg at 06/15/20 0757   escitalopram (LEXAPRO) tablet 10 mg  10 mg Oral Daily Cobos, Rockey Situ, MD   10 mg at 06/15/20 0757   hydrOXYzine (ATARAX/VISTARIL) tablet 25 mg  25 mg Oral TID PRN Money, Gerlene Burdock, FNP   25 mg at 06/14/20 1735   magnesium hydroxide (MILK OF MAGNESIA) suspension 30 mL  30 mL Oral Daily PRN Karsten Ro, MD       traZODone (DESYREL) tablet 50 mg  50 mg Oral QHS PRN Cobos, Rockey Situ, MD   50 mg at 06/14/20 2121    Lab Results: No results found for  this or any previous visit (from the past 48 hour(s)).  Blood Alcohol level:  Lab Results  Component Value Date   [facility] <10 06/12/2020   ETH <11 03/05/2012    Metabolic Disorder Labs: Lab Results  Component Value Date   HGBA1C 5.0 06/12/2020   MPG 96.8 06/12/2020  Lab Results  Component Value Date   PROLACTIN 29.8 (H) 06/12/2020   Lab Results  Component Value Date   CHOL 144 06/12/2020   TRIG 130 06/12/2020   HDL 43 06/12/2020   CHOLHDL 3.3 06/12/2020   VLDL 26 06/12/2020   LDLCALC 75 06/12/2020    Physical Findings: AIMS:  , ,  ,  ,    CIWA:    COWS:     Musculoskeletal: Strength & Muscle Tone: within normal limits Gait & Station: normal Patient leans: N/A  Psychiatric Specialty Exam: Physical Exam Constitutional:      General: She is not in acute distress.    Appearance: Normal appearance. She is not ill-appearing, toxic-appearing or diaphoretic.  HENT:     Head: Normocephalic and atraumatic.  Pulmonary:     Effort: Pulmonary effort is normal.  Neurological:     Mental Status: She is alert and oriented to person, place, and time.     Review of Systems  Constitutional: Negative for activity change, appetite change, fatigue and fever.  Eyes: Negative for visual disturbance.  Respiratory: Negative for chest tightness and shortness of breath.   Cardiovascular: Negative for chest pain and palpitations.  Gastrointestinal: Negative for abdominal pain, constipation, diarrhea, nausea and vomiting.  Neurological: Negative for light-headedness and headaches.  Psychiatric/Behavioral: Positive for dysphoric mood. Negative for agitation, confusion, hallucinations and suicidal ideas. The patient is nervous/anxious.     Blood pressure 107/78, pulse 87, temperature 98 F (36.7 C), temperature source Oral, resp. rate 18, height 5\' 1"  (1.549 m), weight 78.5 kg, SpO2 99 %, currently breastfeeding.Body mass index is 32.69 kg/m.  General Appearance: Casual  Eye Contact:   Fair  Speech:  Normal Rate  Volume:  Normal  Mood:  Anxious and Dysphoric  Affect:  Constricted  Thought Process:  Linear and Descriptions of Associations: Intact  Orientation:  Full (Time, Place, and Person)  Thought Content:  Delusions and Hallucinations: None  Suicidal Thoughts:  No  Homicidal Thoughts:  No  Memory:  Immediate;   Good Recent;   Good Remote;   Good  Judgement:  Fair  Insight:  Fair  Psychomotor Activity:  Normal  Concentration:  Concentration: Good and Attention Span: Good  Recall:  Good  Fund of Knowledge:  Good  Language:  Good  Akathisia:  Negative  Handed:  Right  AIMS (if indicated):     Assets:  Desire for Improvement Housing Resilience  ADL's:  Intact  Cognition:  WNL  Sleep:        Treatment Plan Summary:Pt is admitted with the above mentioned psychiatric history. BP- 107/78 mmHG, PR- 87/min Plan- Daily contact with patient to assess and evaluate symptoms and progress in treatment Monitor Vitals. -Monitor for suicidal ideations. -Monitor for medication Side effects. -Add Milk of Magnesia 30 g PRN for constipation. - Increase Lexopro to 20 mg Daily -Continue  Abilify to 10 mg Daily -Continue Trazodone 50 mg for Sleep PRN -Continue Vistaril 25 mg TID PRN -Discharge Planning in Progress.  , MD 06/15/2020, 11:03 AM

## 2020-06-15 NOTE — Plan of Care (Signed)
Nurse discussed anxiety, depression and coping skills with patient.  

## 2020-06-16 MED ORDER — ARIPIPRAZOLE 10 MG PO TABS: 10.0000 mg | ORAL_TABLET | Freq: Every day | ORAL | 0 refills | Status: DC

## 2020-06-16 MED ORDER — HYDROXYZINE HCL 25 MG PO TABS: 25.0000 mg | ORAL_TABLET | Freq: Three times a day (TID) | ORAL | 0 refills | Status: AC | PRN

## 2020-06-16 NOTE — Progress Notes (Signed)
Patient remains cooperative with treatment. She was visible in the milieu with no behavioral issues to report on shift. She denies SI/AVH/HI. Mood is improving and anxiety level is improving. She appears to be in bed eyes closed resting quietly on shift with no issues to report at this time

## 2020-06-16 NOTE — Progress Notes (Signed)
  [facility] Adult Case Management Discharge Plan :  Will you be returning to the same living situation after discharge:  Yes,  home. At discharge, do you have transportation home?: Yes,  husband to pick up. Do you have the ability to pay for your medications: No. Has insurance.  Release of information consent forms completed and in the chart;  Patient's signature needed at discharge.  Patient to Follow up at:  Follow-up Information    [facility] Go on 06/22/2020.   Why: You have a hospital discharge appointment on 06/22/20 at 1:00 pm for therapy and to assess for medication management.  The appointment will be held in person.  PLEASE CONTACT PROVIDER UPON DISCHARGE FOR APPOINTMENT DETAILS. Contact information: [address] [place] [address] [phone]               Next level of care provider has access to [facility] Link:no  Safety Planning and Suicide Prevention discussed: Yes,  with husband.      Has patient been referred to the Quitline?: Patient refused referral  Patient has been referred for addiction treatment: Pt. refused referral  Otelia Santee, LCSW 06/16/2020, 11:05 AM

## 2020-06-16 NOTE — Tx Team (Signed)
Interdisciplinary Treatment and Diagnostic Plan Update  06/16/2020 Time of Session: 9:10am Katie Briggs MRN: [id number]  Principal Diagnosis: Bipolar 2 disorder, major depressive episode (HCC)  Secondary Diagnoses: Principal Problem:   Bipolar 2 disorder, major depressive episode (HCC) Active Problems:   Bipolar disorder, unspecified (HCC)   Current Medications:  Current Facility-Administered Medications  Medication Dose Route Frequency Provider Last Rate Last Admin  . acetaminophen (TYLENOL) tablet 650 mg  650 mg Oral Q6H PRN Money, Gerlene Burdock, FNP   650 mg at 06/16/20 0759  . ARIPiprazole (ABILIFY) tablet 10 mg  10 mg Oral Daily Cobos, Rockey Situ, MD   10 mg at 06/16/20 0730  . escitalopram (LEXAPRO) tablet 20 mg  20 mg Oral Daily Karsten Ro, MD   20 mg at 06/16/20 0731  . hydrOXYzine (ATARAX/VISTARIL) tablet 25 mg  25 mg Oral TID PRN Money, Gerlene Burdock, FNP   25 mg at 06/15/20 2019  . magnesium hydroxide (MILK OF MAGNESIA) suspension 30 mL  30 mL Oral Daily PRN Karsten Ro, MD      . traZODone (DESYREL) tablet 50 mg  50 mg Oral QHS PRN Cobos, Rockey Situ, MD   50 mg at 06/15/20 2019   PTA Medications: Medications Prior to Admission  Medication Sig Dispense Refill Last Dose  . ARIPiprazole (ABILIFY) 2 MG tablet Take 2 mg by mouth at bedtime.     Marland Kitchen escitalopram (LEXAPRO) 10 MG tablet Take 1 tablet (10 mg total) by mouth daily. (Patient not taking: Reported on 06/13/2020) 30 tablet 11   . escitalopram (LEXAPRO) 20 MG tablet Take 20 mg by mouth daily.     . fluticasone (FLONASE) 50 MCG/ACT nasal spray Place 2 sprays into both nostrils daily.      . traZODone (DESYREL) 100 MG tablet Take 100 mg by mouth at bedtime as needed for sleep.       Patient Stressors: Financial difficulties Marital or family conflict  Patient Strengths: Ability for insight Capable of independent living Communication skills  Treatment Modalities: Medication Management, Group therapy, Case management,  1  to 1 session with clinician, Psychoeducation, Recreational therapy.   Physician Treatment Plan for Primary Diagnosis: Bipolar 2 disorder, major depressive episode (HCC) Long Term Goal(s): Improvement in symptoms so as ready for discharge Improvement in symptoms so as ready for discharge   Short Term Goals: Ability to identify changes in lifestyle to reduce recurrence of condition will improve Ability to verbalize feelings will improve Ability to disclose and discuss suicidal ideas Ability to demonstrate self-control will improve Ability to maintain clinical measurements within normal limits will improve Compliance with prescribed medications will improve Ability to identify triggers associated with substance abuse/mental health issues will improve Ability to identify changes in lifestyle to reduce recurrence of condition will improve Ability to verbalize feelings will improve Ability to disclose and discuss suicidal ideas Ability to demonstrate self-control will improve Ability to identify and develop effective coping behaviors will improve Ability to maintain clinical measurements within normal limits will improve Compliance with prescribed medications will improve Ability to identify triggers associated with substance abuse/mental health issues will improve  Medication Management: Evaluate patient's response, side effects, and tolerance of medication regimen.  Therapeutic Interventions: 1 to 1 sessions, Unit Group sessions and Medication administration.  Evaluation of Outcomes: Adequate for Discharge  Physician Treatment Plan for Secondary Diagnosis: Principal Problem:   Bipolar 2 disorder, major depressive episode (HCC) Active Problems:   Bipolar disorder, unspecified (HCC)  Long Term Goal(s): Improvement in symptoms so  as ready for discharge Improvement in symptoms so as ready for discharge   Short Term Goals: Ability to identify changes in lifestyle to reduce recurrence of  condition will improve Ability to verbalize feelings will improve Ability to disclose and discuss suicidal ideas Ability to demonstrate self-control will improve Ability to maintain clinical measurements within normal limits will improve Compliance with prescribed medications will improve Ability to identify triggers associated with substance abuse/mental health issues will improve Ability to identify changes in lifestyle to reduce recurrence of condition will improve Ability to verbalize feelings will improve Ability to disclose and discuss suicidal ideas Ability to demonstrate self-control will improve Ability to identify and develop effective coping behaviors will improve Ability to maintain clinical measurements within normal limits will improve Compliance with prescribed medications will improve Ability to identify triggers associated with substance abuse/mental health issues will improve     Medication Management: Evaluate patient's response, side effects, and tolerance of medication regimen.  Therapeutic Interventions: 1 to 1 sessions, Unit Group sessions and Medication administration.  Evaluation of Outcomes: Adequate for Discharge   RN Treatment Plan for Primary Diagnosis: Bipolar 2 disorder, major depressive episode (HCC) Long Term Goal(s): Knowledge of disease and therapeutic regimen to maintain health will improve  Short Term Goals: Ability to remain free from injury will improve, Ability to verbalize frustration and anger appropriately will improve, Ability to identify and develop effective coping behaviors will improve and Compliance with prescribed medications will improve  Medication Management: RN will administer medications as ordered by provider, will assess and evaluate patient's response and provide education to patient for prescribed medication. RN will report any adverse and/or side effects to prescribing provider.  Therapeutic Interventions: 1 on 1 counseling  sessions, Psychoeducation, Medication administration, Evaluate responses to treatment, Monitor vital signs and CBGs as ordered, Perform/monitor CIWA, COWS, AIMS and Fall Risk screenings as ordered, Perform wound care treatments as ordered.  Evaluation of Outcomes: Adequate for Discharge   LCSW Treatment Plan for Primary Diagnosis: Bipolar 2 disorder, major depressive episode (HCC) Long Term Goal(s): Safe transition to appropriate next level of care at discharge, Engage patient in therapeutic group addressing interpersonal concerns.  Short Term Goals: Engage patient in aftercare planning with referrals and resources, Increase social support, Identify triggers associated with mental health/substance abuse issues and Increase skills for wellness and recovery  Therapeutic Interventions: Assess for all discharge needs, 1 to 1 time with Social worker, Explore available resources and support systems, Assess for adequacy in community support network, Educate family and significant other(s) on suicide prevention, Complete Psychosocial Assessment, Interpersonal group therapy.  Evaluation of Outcomes: Adequate for Discharge   Progress in Treatment: Attending groups: Yes. Participating in groups: Yes. Taking medication as prescribed: Yes. Toleration medication: Yes. Family/Significant other contact made: Yes, individual(s) contacted:  husband. Patient understands diagnosis: Yes. Discussing patient identified problems/goals with staff: Yes. Medical problems stabilized or resolved: Yes. Denies suicidal/homicidal ideation: Yes. Issues/concerns per patient self-inventory: No.   New problem(s) identified: No, Describe:  none.  New Short Term/Long Term Goal(s): medication stabilization, elimination of SI thoughts, development of comprehensive mental wellness plan.   Patient Goals:  "To feel better"  Discharge Plan or Barriers: Returning home with husband. To follow up with outpatient providers for  med mgmt and therapy.  Reason for Continuation of Hospitalization: Medication stabilization  Estimated Length of Stay: Adequate for discharge.    Attendees: Patient: Katie Briggs 06/16/2020   Physician: Landry Mellow, MD 06/16/2020   Nursing:  06/16/2020   RN Care  Manager: 06/16/2020   Social Worker: Ruthann Cancer, LCSW 06/16/2020   Recreational Therapist:  06/16/2020   Other:  06/16/2020   Other:  06/16/2020   Other: 06/16/2020      Scribe for Treatment Team: Otelia Santee, LCSW 06/16/2020 2:10 PM

## 2020-06-16 NOTE — Progress Notes (Signed)
Discharge Note:  Patient discharged home with family member.  Patient denied SI and HI.  Denied A/V hallucinations.  Suicide prevention information given and discussed with patient who stated she understood and had no questions.  Patient stated she received all her belongings, clothing, toiletries, misc items, etc.  Patient stated she  appreciated all assistance received from [facility] staff.  All required discharge information given to patient at discharge.  

## 2020-06-16 NOTE — [facility] Suicide Risk Assessment (Signed)
[facility] Discharge Suicide Risk Assessment   Principal Problem: Bipolar 2 disorder, major depressive episode (HCC) Discharge Diagnoses: Principal Problem:   Bipolar 2 disorder, major depressive episode (HCC) Active Problems:   Bipolar disorder, unspecified (HCC)   Total Time spent with patient: 20 minutes  Musculoskeletal: Strength & Muscle Tone: within normal limits Gait & Station: normal Patient leans: N/A  Psychiatric Specialty Exam: Review of Systems  All other systems reviewed and are negative.   Blood pressure 96/63, pulse 93, temperature 98.4 F (36.9 C), temperature source Oral, resp. rate 18, height 5\' 1"  (1.549 m), weight 78.5 kg, SpO2 99 %, currently breastfeeding.Body mass index is 32.69 kg/m.  General Appearance: Casual  Eye Contact::  Good  Speech:  Normal Rate409  Volume:  Normal  Mood:  Euthymic  Affect:  Congruent  Thought Process:  Coherent and Descriptions of Associations: Intact  Orientation:  Full (Time, Place, and Person)  Thought Content:  Logical  Suicidal Thoughts:  No  Homicidal Thoughts:  No  Memory:  Immediate;   Good Recent;   Good Remote;   Good  Judgement:  Intact  Insight:  Fair  Psychomotor Activity:  Normal  Concentration:  Fair  Recall:  Good  Fund of Knowledge:Good  Language: Good  Akathisia:  Negative  Handed:  Right  AIMS (if indicated):     Assets:  Desire for Improvement Housing Resilience Social Support  Sleep:     Cognition: WNL  ADL's:  Intact   Mental Status Per Nursing Assessment::   On Admission:  Self-harm thoughts  Demographic Factors:  Caucasian  Loss Factors: Loss of significant relationship  Historical Factors: Impulsivity  Risk Reduction Factors:   Living with another person, especially a relative and Positive social support  Continued Clinical Symptoms:  Depression:   Impulsivity  Cognitive Features That Contribute To Risk:  None    Suicide Risk:  Minimal: No identifiable suicidal ideation.   Patients presenting with no risk factors but with morbid ruminations; may be classified as minimal risk based on the severity of the depressive symptoms   Follow-up Information    [facility] Go on 06/22/2020.   Why: You have a hospital discharge appointment on 06/22/20 at 1:00 pm for therapy and to assess for medication management.  The appointment will be held in person.  PLEASE CONTACT PROVIDER UPON DISCHARGE FOR APPOINTMENT DETAILS. Contact information: [address] [place] [phone]               Plan Of Care/Follow-up recommendations:  Activity:  ad lib  [phone], MD 06/16/2020, 10:01 AM

## 2020-06-16 NOTE — Progress Notes (Signed)
D:  Patient's self inventory sheet, patient has poor sleep, sleep medication not helpful.  Fair appetite, normal energy level, good concentration.  Rated depression 2, denied hopeless and anxiety.  Denied withdrawals.  Denied SI.  Denied physical problems.  Denied physical pain.  Goal is working on relationship.  Plans to spend quality time with him.  "Thank you."  Does have discharge plans. A:  Medications administered per MD orders.  Emotional support and encouragement given patient. R:  Denied SI and HI, contracts for safety.  Denied A/V hallucinations.  Safety maintained with 15 minute checks.
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
## 2020-06-23 DIAGNOSIS — F332 Major depressive disorder, recurrent severe without psychotic features: Secondary | ICD-10-CM | POA: Insufficient documentation

## 2020-06-28 DIAGNOSIS — E669 Obesity, unspecified: Secondary | ICD-10-CM | POA: Insufficient documentation

## 2020-08-08 ENCOUNTER — Emergency Department (HOSPITAL_COMMUNITY)
Admission: EM | Admit: 2020-08-08 | Discharge: 2020-08-08 | Disposition: A | Discharge status: Home / Self Care | Payer: BC Managed Care – PPO | Attending: Emergency Medicine | Admitting: Emergency Medicine

## 2020-08-08 ENCOUNTER — Encounter (HOSPITAL_COMMUNITY): Payer: Self-pay

## 2020-08-08 ENCOUNTER — Other Ambulatory Visit: Payer: Self-pay

## 2020-08-08 ENCOUNTER — Emergency Department (HOSPITAL_COMMUNITY): Payer: BC Managed Care – PPO

## 2020-08-08 DIAGNOSIS — Z87891 Personal history of nicotine dependence: Secondary | ICD-10-CM | POA: Insufficient documentation

## 2020-08-08 DIAGNOSIS — M542 Cervicalgia: Secondary | ICD-10-CM | POA: Diagnosis not present

## 2020-08-08 DIAGNOSIS — M546 Pain in thoracic spine: Secondary | ICD-10-CM | POA: Insufficient documentation

## 2020-08-08 DIAGNOSIS — J45909 Unspecified asthma, uncomplicated: Secondary | ICD-10-CM | POA: Insufficient documentation

## 2020-08-08 MED ORDER — CYCLOBENZAPRINE HCL 10 MG PO TABS
10.0000 mg | ORAL_TABLET | Freq: Two times a day (BID) | ORAL | 0 refills | Status: DC | PRN
Start: 2020-08-08 — End: 2020-08-08

## 2020-08-08 MED ORDER — IBUPROFEN 400 MG PO TABS: 600.0000 mg | ORAL_TABLET | Freq: Once | ORAL | Status: DC

## 2020-08-08 MED ORDER — CYCLOBENZAPRINE HCL 10 MG PO TABS
10.0000 mg | ORAL_TABLET | Freq: Two times a day (BID) | ORAL | 0 refills | Status: DC | PRN
Start: 2020-08-08 — End: 2022-08-02
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
## 2020-11-14 ENCOUNTER — Other Ambulatory Visit: Payer: Self-pay

## 2020-11-14 ENCOUNTER — Emergency Department (HOSPITAL_COMMUNITY)
Admission: EM | Admit: 2020-11-14 | Discharge: 2020-11-14 | Disposition: A | Discharge status: Home / Self Care | Payer: BC Managed Care – PPO | Attending: Emergency Medicine | Admitting: Emergency Medicine

## 2020-11-14 ENCOUNTER — Encounter (HOSPITAL_COMMUNITY): Payer: Self-pay

## 2020-11-14 DIAGNOSIS — B349 Viral infection, unspecified: Secondary | ICD-10-CM

## 2020-11-14 DIAGNOSIS — Z20822 Contact with and (suspected) exposure to covid-19: Secondary | ICD-10-CM | POA: Insufficient documentation

## 2020-11-14 DIAGNOSIS — Z87891 Personal history of nicotine dependence: Secondary | ICD-10-CM | POA: Insufficient documentation

## 2020-11-14 DIAGNOSIS — J45909 Unspecified asthma, uncomplicated: Secondary | ICD-10-CM | POA: Insufficient documentation

## 2020-11-14 LAB — POC SARS CORONAVIRUS 2 AG -  ED: SARS Coronavirus 2 Ag: NEGATIVE

## 2020-11-14 MED ORDER — ONDANSETRON 4 MG PO TBDP
4.0000 mg | ORAL_TABLET | Freq: Once | ORAL | Status: AC
  Administered 2020-11-14: 4 mg via ORAL
  Filled 2020-11-14: qty 1

## 2020-11-14 MED ORDER — ONDANSETRON 4 MG PO TBDP
4.0000 mg | ORAL_TABLET | Freq: Three times a day (TID) | ORAL | 0 refills | Status: DC | PRN
Start: 2020-11-14 — End: 2022-08-02

## 2020-11-14 NOTE — ED Notes (Signed)
Pt discharged home per MD order. Discharge summary reviewed with pt, pt verbalizes understanding. Ambulatory off unit. No s/s of acute distress noted.

## 2020-11-14 NOTE — ED Triage Notes (Signed)
Pt reports body aches, fever, chills, vomit x1 after eating and headache x1 day

## 2020-11-14 NOTE — Discharge Instructions (Addendum)
Concern for COVID-19, influenza, or other viral illness.  Your point-of-care antigen COVID-19 test was negative, but PCR was not obtained given low supply and that you are well-appearing and stable for discharge.  Please maintain isolation precautions.  Check your temperature regularly and take Tylenol as needed for fever control.  Increase your oral hydration and continue to eat regular meals.  I recommend over-the-counter medications as needed for symptom relief.  Please take Zofran ODT as needed for nausea symptoms.  Follow-up with your primary care provider regarding today's encounter and for ongoing management.  If you do not have one, please get established with one as soon as possible.  If you do not have insurance, please consider the [facility] and [facility].  Return to the ED or seek immediate medical attention should you experience any new or worsening symptoms.

## 2020-11-14 NOTE — ED Provider Notes (Addendum)
[facility] EMERGENCY DEPARTMENT Provider Note   CSN: [id number] Arrival date & time: 11/14/20  0845     History Chief Complaint  Patient presents with  . Generalized Body Aches  . Fever    Katie Briggs is a 30 y.o. female with no significant past medical history presents the ED with a 1-day history of generalized body aches, fevers, chills, headache, and nonbloody emesis x1.  On my examination, patient reports that there have been sick contacts at work, but multiple weeks ago.  She states that her symptoms just began yesterday.  She endorses sinus congestion and rhinorrhea, mild intermittent nonproductive cough, crampy abdominal discomfort, nausea, and emesis x1.  She has not taken thing for symptoms.  She states that she also noticed fevers and chills yesterday.  She lives with her husband and 2 children, nobody else in the home is sick.  She states that she has had trouble eating and drinking due to her nausea symptoms.  She denies any chest pain, difficulty breathing, unilateral extremity swelling or edema, hematemesis, urinary symptoms, or changes in bowel habits.  Patient was fully immunized for COVID-19 in May 2021, did not receive the booster.  HPI     Past Medical History:  Diagnosis Date  . Anemia   . Anxiety   . Asthma   . Bipolar disorder (HCC)   . Cervix prolapsed into vagina   . Depression   . Former smoker   . Itching in the vaginal area 01/09/2016  . OCD (obsessive compulsive disorder)   . Supervision of normal pregnancy in second trimester 05/11/2015    [facility] at   05/11/15 FOB Kerney Elbe 30 yo Dating By  LMP and [place] Pap  05/11/15 GC/CT Initial:                36+wks: Genetic Screen NT/IT:  CF screen  Anatomic [place]  Flu vaccine  Tdap Recommended ~ 28wks Glucose Screen  2 hr GBS  Feed Preference  Contraception  Circumcision  Childbirth Classes  Pediatrician    . Vaginal discharge 01/09/2016  . Yeast infection 01/09/2016     Patient Active Problem List   Diagnosis Date Noted  . Bipolar disorder, unspecified (HCC) 06/13/2020  . Bipolar 2 disorder, major depressive episode (HCC) 06/13/2020  . Severe episode of recurrent major depressive disorder, without psychotic features (HCC)   . Vaginal discharge 01/09/2016  . Itching in the vaginal area 01/09/2016  . Yeast infection 01/09/2016  . Premature rupture of membranes 11/08/2015  . Supervision of normal pregnancy 05/11/2015  . Rectal bleeding 10/28/2013  . Unspecified constipation 10/28/2013  . Obsessive compulsive disorder 03/10/2012  . Bipolar affective disorder, depressed, severe (HCC) 03/06/2012    Class: Acute    Past Surgical History:  Procedure Laterality Date  . COLONOSCOPY N/A 11/12/2013   Procedure: COLONOSCOPY;  Surgeon: Corbin Ade, MD;  [facility];  Service: Endoscopy;  Laterality: N/A;  2:40  . DILATION AND CURETTAGE OF UTERUS    . LAPAROSCOPIC BILATERAL SALPINGECTOMY Bilateral 01/25/2016   Procedure: LAPAROSCOPIC BILATERAL SALPINGECTOMY;  Surgeon: Lazaro Arms, MD;  Location: [facility]S;  Service: Gynecology;  Laterality: Bilateral;  . None    . TUBAL LIGATION     salpingectomy-tubes removed     OB History    Gravida  4   Para  3   Term  3   Preterm  0   AB  1   Living  3     SAB  1   IAB  0   Ectopic  0   Multiple  0   Live Births  3           Family History  Problem Relation Age of Onset  . Colon polyps Maternal Grandmother   . Depression Maternal Grandmother   . Depression Mother   . Hypertension Mother   . Stroke Brother        while in womb  . Seizures Brother   . Asthma Son   . Colon cancer Other         maternal great grandmother    Social History   Tobacco Use  . Smoking status: Former Smoker    Packs/day: 0.25    Years: 0.50    Pack years: 0.12    Types: Cigarettes    Quit date: 01/20/2007    Years since quitting: 13.8  . Smokeless tobacco: Never Used  Substance Use  Topics  . Alcohol use: No  . Drug use: No    Home Medications Prior to Admission medications   Medication Sig Start Date End Date Taking? Authorizing Provider  ondansetron (ZOFRAN ODT) 4 MG disintegrating tablet Take 1 tablet (4 mg total) by mouth every 8 (eight) hours as needed for nausea or vomiting. 11/14/20  Yes Lorelee New, PA-C  ARIPiprazole (ABILIFY) 10 MG tablet Take 1 tablet (10 mg total) by mouth daily. 06/17/20 07/17/20  Karsten Ro, MD  cyclobenzaprine (FLEXERIL) 10 MG tablet Take 1 tablet (10 mg total) by mouth 2 (two) times daily as needed for muscle spasms. 08/08/20   Sponseller, Lupe Carney R, PA-C  escitalopram (LEXAPRO) 20 MG tablet Take 20 mg by mouth daily. 02/21/20   [provider]  fluticasone (FLONASE) 50 MCG/ACT nasal spray Place 2 sprays into both nostrils daily.     [provider]    Allergies    Patient has no known allergies.  Review of Systems   Review of Systems  All other systems reviewed and are negative.   Physical Exam Updated Vital Signs BP 105/66 (BP Location: Left Arm)   Pulse 78   Temp 98.3 F (36.8 C) (Oral)   Resp 16   SpO2 100%   Physical Exam Vitals and nursing note reviewed. Exam conducted with a chaperone present.  HENT:     Head: Normocephalic and atraumatic.  Eyes:     General: No scleral icterus.    Conjunctiva/sclera: Conjunctivae normal.  Cardiovascular:     Rate and Rhythm: Normal rate and regular rhythm.     Pulses: Normal pulses.     Heart sounds: Normal heart sounds.  Pulmonary:     Effort: Pulmonary effort is normal. No respiratory distress.     Breath sounds: Normal breath sounds. No wheezing or rales.  Musculoskeletal:        General: Normal range of motion.  Skin:    General: Skin is dry.  Neurological:     Mental Status: She is alert and oriented to person, place, and time.     GCS: GCS eye subscore is 4. GCS verbal subscore is 5. GCS motor subscore is 6.  Psychiatric:        Mood and  Affect: Mood normal.        Behavior: Behavior normal.        Thought Content: Thought content normal.     ED Results / Procedures / Treatments   Labs (all labs ordered are listed, but  only abnormal results are displayed) Labs Reviewed  POC SARS CORONAVIRUS 2 AG -  ED    EKG None  Radiology No results found.  Procedures Procedures (including critical care time)  Medications Ordered in ED Medications  ondansetron (ZOFRAN-ODT) disintegrating tablet 4 mg (has no administration in time range)    ED Course  I have reviewed the triage vital signs and the nursing notes.  Pertinent labs & imaging results that were available during my care of the patient were reviewed by me and considered in my medical decision making (see chart for details).    MDM Rules/Calculators/A&P                          Patient with symptoms consistent with viral illness.  Point-of-care antigen COVID-19 test was obtained in triage and negative.    Symptoms are likely of viral etiology.  Discussed that antibiotics are not indicated for viral infections.  Patient will be discharged with symptomatic treatment.  Given brevity of illness, I do not believe that chest x-ray or laboratory work-up would yield any significant findings.  I emphasized the importance of rest, continued oral hydration, and antipyretics as needed for fever control.  Her abdominal exam was entirely benign and without any focal areas of tenderness.  Her vital signs are stable and within normal limits here in the ED.  Will treat patient with Zofran ODT and then p.o. challenge.  If she can eat and drink, she is safe for discharge home.  Will prescribe outpatient Zofran ODT and encouraged her to maintain isolation precautions given that there was no PCR test obtained.  I informed patient that she was not tested for influenza, but she is not immunocompromised and agrees that it would not change management as she would elect not to proceed with  Tamiflu.    They were provided opportunity to ask any additional questions and have none at this time.  Prior to discharge patient is feeling well, agreeable with plan for discharge home.  They have expressed understanding of verbal discharge instructions as well as return precautions and are agreeable to the plan.   Milanya R Voorhies was evaluated in Emergency Department on 11/14/2020 for the symptoms described in the history of present illness. She was evaluated in the context of the global COVID-19 pandemic, which necessitated consideration that the patient might be at risk for infection with the SARS-CoV-2 virus that causes COVID-19. Institutional protocols and algorithms that pertain to the evaluation of patients at risk for COVID-19 are in a state of rapid change based on information released by regulatory bodies including the CDC and federal and state organizations. These policies and algorithms were followed during the patient's care in the ED.   Final Clinical Impression(s) / ED Diagnoses Final diagnoses:  Viral illness    Rx / DC Orders ED Discharge Orders         Ordered    ondansetron (ZOFRAN ODT) 4 MG disintegrating tablet  Every 8 hours PRN        11/14/20 1153           Elvera Maria 11/14/20 1154    Little, Ambrose Finland, MD 11/14/20 1516    Lorelee New, PA-C 11/21/20 0931    Little, Ambrose Finland, MD 11/21/20 1109

## 2020-11-23 ENCOUNTER — Other Ambulatory Visit: Payer: Self-pay

## 2020-11-23 ENCOUNTER — Encounter (HOSPITAL_COMMUNITY): Payer: Self-pay | Admitting: Emergency Medicine

## 2020-11-23 ENCOUNTER — Emergency Department (HOSPITAL_COMMUNITY)
Admission: EM | Admit: 2020-11-23 | Discharge: 2020-11-23 | Disposition: A | Discharge status: Home / Self Care | Payer: HRSA Program | Attending: Emergency Medicine | Admitting: Emergency Medicine

## 2020-11-23 DIAGNOSIS — U071 COVID-19: Secondary | ICD-10-CM | POA: Insufficient documentation

## 2020-11-23 DIAGNOSIS — Z5321 Procedure and treatment not carried out due to patient leaving prior to being seen by health care provider: Secondary | ICD-10-CM | POA: Diagnosis not present

## 2020-11-23 DIAGNOSIS — R0789 Other chest pain: Secondary | ICD-10-CM | POA: Diagnosis present

## 2020-11-23 NOTE — ED Triage Notes (Signed)
Pt here covid positive test at home today feels tight in her chest and body aches

## 2022-04-15 ENCOUNTER — Emergency Department (HOSPITAL_COMMUNITY)
Admission: EM | Admit: 2022-04-15 | Discharge: 2022-04-16 | Discharge status: Against Medical Advice | Payer: Self-pay | Attending: Emergency Medicine | Admitting: Emergency Medicine

## 2022-04-15 ENCOUNTER — Other Ambulatory Visit: Payer: Self-pay

## 2022-04-15 ENCOUNTER — Encounter (HOSPITAL_COMMUNITY): Payer: Self-pay | Admitting: Emergency Medicine

## 2022-04-15 DIAGNOSIS — H538 Other visual disturbances: Secondary | ICD-10-CM | POA: Insufficient documentation

## 2022-04-15 DIAGNOSIS — Z5321 Procedure and treatment not carried out due to patient leaving prior to being seen by health care provider: Secondary | ICD-10-CM | POA: Insufficient documentation

## 2022-04-15 DIAGNOSIS — H5713 Ocular pain, bilateral: Secondary | ICD-10-CM | POA: Insufficient documentation

## 2022-04-15 MED ORDER — TETRACAINE HCL 0.5 % OP SOLN: 1.0000 [drp] | Freq: Once | OPHTHALMIC | Status: DC

## 2022-04-15 MED ORDER — FLUORESCEIN SODIUM 1 MG OP STRP: 1.0000 | ORAL_STRIP | Freq: Once | OPHTHALMIC | Status: DC

## 2022-04-15 MED ORDER — POLYMYXIN B-TRIMETHOPRIM 10000-0.1 UNIT/ML-% OP SOLN
1.0000 [drp] | OPHTHALMIC | Status: DC
  Filled 2022-04-15: qty 10

## 2022-04-15 NOTE — ED Provider Triage Note (Signed)
  Emergency Medicine Provider Triage Evaluation Note  MRN:  [id number]  Arrival date & time: 04/15/22    Medically screening exam initiated at 11:01 PM.   CC:   Eye Problem   HPI:  [name] is a 31 y.o. year-old female presents to the ED with chief complaint of bilateral eye irritation and discharge x 2 days.  States that she got some diaper "sap" in her right eye 3 days ago.  Irrigated her eyes in an eyewash station at her work.  Reports some blurry vision.  Reports some nasal congestion.  History provided by History provided by patient. ROS:  -As included in HPI PE:   Vitals:   04/15/22 2231 04/15/22 2252  BP: 116/80   Pulse: 76   Resp: 18   Temp:  98.4 F (36.9 C)  SpO2: 99%     Non-toxic appearing No respiratory distress Bilateral conjunctivitis MDM:  Based on signs and symptoms, conjunctivitis is highest on my differential, followed by retained fB. I've ordered meds in triage to expedite lab/diagnostic workup.  Patient was informed that the remainder of the evaluation will be completed by another provider, this initial triage assessment does not replace that evaluation, and the importance of remaining in the ED until their evaluation is complete.    Roxy Horseman, PA-C 04/15/22 2303

## 2022-04-15 NOTE — ED Triage Notes (Signed)
Pt reported to ED with c/o eye redness, drainage and pain that has gradually increased since incident where debris flew into eye on Thursday. Pt states yesterday she noticed the drainage and pain started with rt eye and has moved to left eye.

## 2022-04-16 NOTE — ED Notes (Signed)
Patient states she is leaving d/t wait time 

## 2022-08-02 ENCOUNTER — Ambulatory Visit: Payer: 59 | Admitting: Family Medicine

## 2022-08-02 ENCOUNTER — Encounter: Payer: Self-pay | Admitting: Family Medicine

## 2022-08-02 VITALS — BP 101/69 | HR 67 | Temp 97.4°F | Ht 61.0 in | Wt 184.6 lb

## 2022-08-02 DIAGNOSIS — F429 Obsessive-compulsive disorder, unspecified: Secondary | ICD-10-CM

## 2022-08-02 DIAGNOSIS — R0683 Snoring: Secondary | ICD-10-CM | POA: Diagnosis not present

## 2022-08-02 DIAGNOSIS — F603 Borderline personality disorder: Secondary | ICD-10-CM

## 2022-08-02 DIAGNOSIS — Z6834 Body mass index (BMI) 34.0-34.9, adult: Secondary | ICD-10-CM

## 2022-08-02 DIAGNOSIS — E6609 Other obesity due to excess calories: Secondary | ICD-10-CM | POA: Diagnosis not present

## 2022-08-02 DIAGNOSIS — F3130 Bipolar disorder, current episode depressed, mild or moderate severity, unspecified: Secondary | ICD-10-CM | POA: Diagnosis not present

## 2022-08-02 DIAGNOSIS — F909 Attention-deficit hyperactivity disorder, unspecified type: Secondary | ICD-10-CM

## 2022-08-02 DIAGNOSIS — K219 Gastro-esophageal reflux disease without esophagitis: Secondary | ICD-10-CM | POA: Diagnosis not present

## 2022-08-02 DIAGNOSIS — F419 Anxiety disorder, unspecified: Secondary | ICD-10-CM

## 2022-08-02 MED ORDER — PANTOPRAZOLE SODIUM 20 MG PO TBEC: 20.0000 mg | DELAYED_RELEASE_TABLET | Freq: Every day | ORAL | 1 refills | Status: DC

## 2022-08-02 NOTE — Progress Notes (Signed)
New Patient Office Visit  Subjective    Patient ID: Katie Briggs, female    DOB: September 19, 1991  Age: 31 y.o. MRN: [id number]  CC:  Chief Complaint  Patient presents with   New Patient (Initial Visit)    HPI Jeanine Caven Guarino presents to establish care. She has a history of Bipolar disorder, OCD, borderline personality disorder, anxiety, and ADHD. This is managed by her psychiatrist, Dr. Angelica Pou. She has been seeing him for about 2 years and she reports good control of her symptoms.   She reports symptoms of acid reflux. She reports heartburn and water brash. This has been present for years but worsened over the last 2 months. Symptoms are much worse at night. She has been taking tums but this has no longer been helping. She has had some NBNB vomiting this week due to symptoms. Denies abdominal pain. There is a family history of GERD.   She is concerned about sleep apnea. She reports regular snoring. Her husband has also witness apnea in her sleep. She has never had a sleep study. There is a family history of OSA.       08/02/2022    3:48 PM 06/12/2020    4:07 PM  Depression screen PHQ 2/9  Decreased Interest 0   Down, Depressed, Hopeless 0   PHQ - 2 Score 0   Altered sleeping 1   Tired, decreased energy 1   Change in appetite 1   Feeling bad or failure about yourself  0   Trouble concentrating 1   Moving slowly or fidgety/restless 0   Suicidal thoughts 0   PHQ-9 Score 4   Difficult doing work/chores Somewhat difficult      Information is confidential and restricted. Go to Review Flowsheets to unlock data.      08/02/2022    3:48 PM  GAD 7 : Generalized Anxiety Score  Nervous, Anxious, on Edge 1  Control/stop worrying 1  Worry too much - different things 0  Trouble relaxing 1  Restless 0  Easily annoyed or irritable 1  Afraid - awful might happen 0  Total GAD 7 Score 4  Anxiety Difficulty Somewhat difficult      Outpatient Encounter Medications as of  08/02/2022  Medication Sig   ARIPiprazole (ABILIFY) 10 MG tablet Take 10 mg by mouth daily.   lamoTRIgine (LAMICTAL) 200 MG tablet Take 200 mg by mouth daily.   LORazepam (ATIVAN) 0.5 MG tablet Take 0.5 mg by mouth every 6 (six) hours as needed for anxiety.   methylphenidate 27 MG PO CR tablet Take 27 mg by mouth every morning.   QUEtiapine (SEROQUEL) 50 MG tablet Take 50 mg by mouth at bedtime.   sildenafil (VIAGRA) 25 MG tablet Take 25 mg by mouth daily as needed for erectile dysfunction.   [DISCONTINUED] ARIPiprazole (ABILIFY) 10 MG tablet Take 1 tablet (10 mg total) by mouth daily.   [DISCONTINUED] cyclobenzaprine (FLEXERIL) 10 MG tablet Take 1 tablet (10 mg total) by mouth 2 (two) times daily as needed for muscle spasms.   [DISCONTINUED] escitalopram (LEXAPRO) 20 MG tablet Take 20 mg by mouth daily.   [DISCONTINUED] fluticasone (FLONASE) 50 MCG/ACT nasal spray Place 2 sprays into both nostrils daily.    [DISCONTINUED] ondansetron (ZOFRAN ODT) 4 MG disintegrating tablet Take 1 tablet (4 mg total) by mouth every 8 (eight) hours as needed for nausea or vomiting.   No facility-administered encounter medications on file as of 08/02/2022.    Past Medical History:  Diagnosis Date   Anemia    Anxiety    Asthma    Bipolar disorder ([place])    Cervix prolapsed into vagina    Depression    Former smoker    Itching in the vaginal area 01/09/2016   OCD (obsessive compulsive disorder)    Supervision of normal pregnancy in second trimester 05/11/2015    [facility] Initiated Care at   05/11/15 FOB Dominica Severin 31 yo Dating By  LMP and [place] Pap  05/11/15 GC/CT Initial:                36+wks: Genetic Screen NT/IT:  CF screen  Anatomic [place]  Flu vaccine  Tdap Recommended ~ 28wks Glucose Screen  2 hr GBS  Feed Preference  Contraception  Circumcision  Childbirth Classes  Pediatrician     Vaginal discharge 01/09/2016   Yeast infection 01/09/2016    Past Surgical History:  Procedure Laterality Date    COLONOSCOPY N/A 11/12/2013   Procedure: COLONOSCOPY;  Surgeon: Daneil Dolin, MD;  Location: [facility];  Service: Endoscopy;  Laterality: N/A;  2:40   DILATION AND CURETTAGE OF UTERUS     LAPAROSCOPIC BILATERAL SALPINGECTOMY Bilateral 01/25/2016   Procedure: LAPAROSCOPIC BILATERAL SALPINGECTOMY;  Surgeon: Florian Buff, MD;  Location: [facility];  Service: Gynecology;  Laterality: Bilateral;   None     TUBAL LIGATION     salpingectomy-tubes removed    Family History  Problem Relation Age of Onset   Depression Mother    Hypertension Mother    Depression Sister    Stroke Brother        while in womb   Seizures Brother    ADD / ADHD Daughter    Asthma Son    Seizures Son    Colon polyps Maternal Grandmother    Depression Maternal Grandmother    Colon cancer Other         maternal great grandmother    Social History   Socioeconomic History   Marital status: Married    Spouse name: Harrell Gave   Number of children: 3   Years of education: 12   Highest education level: 12th grade  Occupational History   Occupation: MACHINE OPERATOR  Tobacco Use   Smoking status: Former    Packs/day: 0.25    Years: 0.50    Total pack years: 0.13    Types: Cigarettes    Quit date: 01/20/2007    Years since quitting: 15.5   Smokeless tobacco: Never  Vaping Use   Vaping Use: Never used  Substance and Sexual Activity   Alcohol use: No    Comment: RARE   Drug use: No   Sexual activity: Yes    Birth control/protection: None, Condom  Other Topics Concern   Not on file  Social History Narrative   Not on file   Social Determinants of Health   Financial Resource Strain: Not on file  Food Insecurity: Not on file  Transportation Needs: Not on file  Physical Activity: Not on file  Stress: Not on file  Social Connections: Not on file  Intimate Partner Violence: Not on file    ROS Negative unless specially indicated above in HPI.     Objective    BP 101/69   Pulse 67   Temp (!)  97.4 F (36.3 C)   Ht 5' 1"  (1.549 m)   Wt 184 lb 9.6 oz (83.7 kg)   SpO2 95%   BMI 34.88 kg/m  Physical Exam Vitals and nursing note reviewed.  Constitutional:      General: She is not in acute distress.    Appearance: She is not ill-appearing, toxic-appearing or diaphoretic.  HENT:     Head: Normocephalic and atraumatic.     Right Ear: Tympanic membrane, ear canal and external ear normal.     Left Ear: Tympanic membrane, ear canal and external ear normal.     Nose: Nose normal.     Mouth/Throat:     Mouth: Mucous membranes are moist.     Pharynx: Oropharynx is clear.  Cardiovascular:     Rate and Rhythm: Normal rate and regular rhythm.     Heart sounds: Normal heart sounds. No murmur heard. Pulmonary:     Effort: Pulmonary effort is normal.     Breath sounds: Normal breath sounds.  Abdominal:     General: Bowel sounds are normal. There is no distension.     Palpations: Abdomen is soft.     Tenderness: There is no abdominal tenderness. There is no guarding or rebound.  Musculoskeletal:     Right lower leg: No edema.     Left lower leg: No edema.  Skin:    General: Skin is warm and dry.  Neurological:     General: No focal deficit present.     Mental Status: She is alert and oriented to person, place, and time.  Psychiatric:        Mood and Affect: Mood normal.        Behavior: Behavior normal.         Assessment & Plan:   Jurline was seen today for new patient (initial visit).  Diagnoses and all orders for this visit:  Gastroesophageal reflux disease, unspecified whether esophagitis present Uncontrolled. Start protonix as below. Benign exam. Will follow up in 6 weeks. Discussed pepcid OTC BID prn.  -     pantoprazole (PROTONIX) 20 MG tablet; Take 1 tablet (20 mg total) by mouth daily.  Loud snoring With witness apnea. Referral for possible OSA.  -     Ambulatory referral to Sleep Studies  Class 1 obesity due to excess calories with serious comorbidity  and body mass index (BMI) of 34.0 to 34.9 in adult Fasting labs pending.  -     TSH -     CBC with Differential/Platelet -     CMP14+EGFR -     Lipid panel  Bipolar affective disorder, current episode depressed, current episode severity unspecified (HCC) Borderline personality disorder (HCC) Obsessive-compulsive disorder, unspecified type Anxiety Attention deficit hyperactivity disorder (ADHD), unspecified ADHD type Currently on abilify, ativan, lamictal, concerta, and seroquel. Managed by psychaitry. Reports well controlled.   Return in about 6 weeks (around 09/13/2022) for CPE with pap .   The patient indicates understanding of these issues and agrees with the plan.  Gwenlyn Perking, FNP

## 2022-08-02 NOTE — Patient Instructions (Signed)
Food Choices for Gastroesophageal Reflux Disease, Adult When you have gastroesophageal reflux disease (GERD), the foods you eat and your eating habits are very important. Choosing the right foods can help ease the discomfort of GERD. Consider working with a dietitian to help you make healthy food choices. What are tips for following this plan? Reading food labels Look for foods that are low in saturated fat. Foods that have less than 5% of daily value (DV) of fat and 0 g of trans fats may help with your symptoms. Cooking Cook foods using methods other than frying. This may include baking, steaming, grilling, or broiling. These are all methods that do not need a lot of fat for cooking. To add flavor, try to use herbs that are low in spice and acidity. Meal planning  Choose healthy foods that are low in fat, such as fruits, vegetables, whole grains, low-fat dairy products, lean meats, fish, and poultry. Eat frequent, small meals instead of three large meals each day. Eat your meals slowly, in a relaxed setting. Avoid bending over or lying down until 2-3 hours after eating. Limit high-fat foods such as fatty meats or fried foods. Limit your intake of fatty foods, such as oils, butter, and shortening. Avoid the following as told by your health care provider: Foods that cause symptoms. These may be different for different people. Keep a food diary to keep track of foods that cause symptoms. Alcohol. Drinking large amounts of liquid with meals. Eating meals during the 2-3 hours before bed. Lifestyle Maintain a healthy weight. Ask your health care provider what weight is healthy for you. If you need to lose weight, work with your health care provider to do so safely. Exercise for at least 30 minutes on 5 or more days each week, or as told by your health care provider. Avoid wearing clothes that fit tightly around your waist and chest. Do not use any products that contain nicotine or tobacco. These  products include cigarettes, chewing tobacco, and vaping devices, such as e-cigarettes. If you need help quitting, ask your health care provider. Sleep with the head of your bed raised. Use a wedge under the mattress or blocks under the bed frame to raise the head of the bed. Chew sugar-free gum after mealtimes. What foods should I eat?  Eat a healthy, well-balanced diet of fruits, vegetables, whole grains, low-fat dairy products, lean meats, fish, and poultry. Each person is different. Foods that may trigger symptoms in one person may not trigger any symptoms in another person. Work with your health care provider to identify foods that are safe for you. The items listed above may not be a complete list of recommended foods and beverages. Contact a dietitian for more information. What foods should I avoid? Limiting some of these foods may help manage the symptoms of GERD. Everyone is different. Consult a dietitian or your health care provider to help you identify the exact foods to avoid, if any. Fruits Any fruits prepared with added fat. Any fruits that cause symptoms. For some people this may include citrus fruits, such as oranges, grapefruit, pineapple, and lemons. Vegetables Deep-fried vegetables. French fries. Any vegetables prepared with added fat. Any vegetables that cause symptoms. For some people, this may include tomatoes and tomato products, chili peppers, onions and garlic, and horseradish. Grains Pastries or quick breads with added fat. Meats and other proteins High-fat meats, such as fatty beef or pork, hot dogs, ribs, ham, sausage, salami, and bacon. Fried meat or protein, including   fried fish and fried chicken. Nuts and nut butters, in large amounts. Dairy Whole milk and chocolate milk. Sour cream. Cream. Ice cream. Cream cheese. Milkshakes. Fats and oils Butter. Margarine. Shortening. Ghee. Beverages Coffee and tea, with or without caffeine. Carbonated beverages. Sodas. Energy  drinks. Fruit juice made with acidic fruits, such as orange or grapefruit. Tomato juice. Alcoholic drinks. Sweets and desserts Chocolate and cocoa. Donuts. Seasonings and condiments Pepper. Peppermint and spearmint. Added salt. Any condiments, herbs, or seasonings that cause symptoms. For some people, this may include curry, hot sauce, or vinegar-based salad dressings. The items listed above may not be a complete list of foods and beverages to avoid. Contact a dietitian for more information. Questions to ask your health care provider Diet and lifestyle changes are usually the first steps that are taken to manage symptoms of GERD. If diet and lifestyle changes do not improve your symptoms, talk with your health care provider about taking medicines. Where to find more information International [employer] G[employer]intestinal [employer]: aboutgerd.org Summary When you have gastroesophageal reflux disease (GERD), food and lifestyle choices may be very helpful in easing the discomfort of GERD. Eat frequent, small meals instead of three large meals each day. Eat your meals slowly, in a relaxed setting. Avoid bending over or lying down until 2-3 hours after eating. Limit high-fat foods such as fatty meats or fried foods. This information is not intended to replace advice given to you by your health care provider. Make sure you discuss any questions you have with your health care provider. Document Revised: 05/02/2020 Document Reviewed: 05/02/2020 Elsevier Patient Education  2023 Elsevier Inc.  

## 2022-08-03 LAB — LIPID PANEL
Chol/HDL Ratio: 2.5 ratio (ref 0.0–4.4)
Cholesterol, Total: 147 mg/dL (ref 100–199)
HDL: 58 mg/dL (ref >39)
LDL Chol Calc (NIH): 74 mg/dL (ref 0–99)
Triglycerides: 79 mg/dL (ref 0–149)
VLDL Cholesterol Cal: 15 mg/dL (ref 5–40)

## 2022-08-03 LAB — CMP14+EGFR
ALT: 19 IU/L (ref 0–32)
AST: 17 IU/L (ref 0–40)
Albumin/Globulin Ratio: 1.8 (ref 1.2–2.2)
Albumin: 4 g/dL (ref 3.9–4.9)
Alkaline Phosphatase: 88 IU/L (ref 44–121)
BUN/Creatinine Ratio: 16 (ref 9–23)
BUN: 12 mg/dL (ref 6–20)
Bilirubin Total: 0.2 mg/dL (ref 0.0–1.2)
CO2: 22 mmol/L (ref 20–29)
Calcium: 9.4 mg/dL (ref 8.7–10.2)
Chloride: 103 mmol/L (ref 96–106)
Creatinine, Ser: 0.74 mg/dL (ref 0.57–1.00)
Globulin, Total: 2.2 g/dL (ref 1.5–4.5)
Glucose: 82 mg/dL (ref 70–99)
Potassium: 4.4 mmol/L (ref 3.5–5.2)
Sodium: 140 mmol/L (ref 134–144)
Total Protein: 6.2 g/dL (ref 6.0–8.5)
eGFR: 110 mL/min/{1.73_m2} (ref >59)

## 2022-08-03 LAB — CBC WITH DIFFERENTIAL/PLATELET
Basophils Absolute: 0 10*3/uL (ref 0.0–0.2)
Basos: 0 %
EOS (ABSOLUTE): 0.3 10*3/uL (ref 0.0–0.4)
Eos: 3 %
Hematocrit: 40 % (ref 34.0–46.6)
Hemoglobin: 13.3 g/dL (ref 11.1–15.9)
Immature Grans (Abs): 0 10*3/uL (ref 0.0–0.1)
Immature Granulocytes: 0 %
Lymphocytes Absolute: 3.1 10*3/uL (ref 0.7–3.1)
Lymphs: 31 %
MCH: 29.5 pg (ref 26.6–33.0)
MCHC: 33.3 g/dL (ref 31.5–35.7)
MCV: 89 fL (ref 79–97)
Monocytes Absolute: 0.7 10*3/uL (ref 0.1–0.9)
Monocytes: 7 %
Neutrophils Absolute: 5.9 10*3/uL (ref 1.4–7.0)
Neutrophils: 59 %
Platelets: 314 10*3/uL (ref 150–450)
RBC: 4.51 x10E6/uL (ref 3.77–5.28)
RDW: 12.6 % (ref 11.7–15.4)
WBC: 10.1 10*3/uL (ref 3.4–10.8)

## 2022-08-03 LAB — TSH: TSH: 1.73 u[IU]/mL (ref 0.450–4.500)

## 2022-09-13 ENCOUNTER — Other Ambulatory Visit: Payer: Self-pay | Admitting: Family Medicine

## 2022-09-13 ENCOUNTER — Ambulatory Visit (INDEPENDENT_AMBULATORY_CARE_PROVIDER_SITE_OTHER): Payer: 59 | Admitting: Family Medicine

## 2022-09-13 ENCOUNTER — Encounter: Payer: Self-pay | Admitting: Family Medicine

## 2022-09-13 VITALS — BP 101/67 | HR 79 | Temp 98.2°F | Wt 181.0 lb

## 2022-09-13 DIAGNOSIS — E6609 Other obesity due to excess calories: Secondary | ICD-10-CM

## 2022-09-13 DIAGNOSIS — K219 Gastro-esophageal reflux disease without esophagitis: Secondary | ICD-10-CM

## 2022-09-13 DIAGNOSIS — Z0001 Encounter for general adult medical examination with abnormal findings: Secondary | ICD-10-CM

## 2022-09-13 DIAGNOSIS — Z0000 Encounter for general adult medical examination without abnormal findings: Secondary | ICD-10-CM

## 2022-09-13 DIAGNOSIS — G4733 Obstructive sleep apnea (adult) (pediatric): Secondary | ICD-10-CM | POA: Diagnosis not present

## 2022-09-13 DIAGNOSIS — Z6834 Body mass index (BMI) 34.0-34.9, adult: Secondary | ICD-10-CM

## 2022-09-13 NOTE — Progress Notes (Signed)
Complete physical exam  Patient: Katie Briggs   DOB: [birth year]   30 y.o. Female  MRN: [id number]  Subjective:    Chief Complaint  Patient presents with   Annual Exam    With pap    Katie Briggs is a 31 y.o. female who presents today for a complete physical exam. She reports consuming a general diet. Gym/ health club routine includes cardio and mod to heavy weightlifting. She generally feels fairly well. She reports sleeping poorly. She does have additional problems to discuss today.   She would like to talk about weight loss. She struggles to ever feel full. She also is an Surveyor, quantity. She struggles to eat healthy due to cost of healthy food. She has tried OTC supplement without improvement.  She had a sleep study done that did show mild OSA. They have ordered CPAP for her.   Reports GERD is well controlled with protonix. She is no longer having any symptoms.   She started her menstrual cycle today.   Most recent fall risk assessment:    08/02/2022    3:45 PM  Fall Risk   Falls in the past year? 0     Most recent depression screenings:    08/02/2022    3:48 PM 06/12/2020    4:07 PM  PHQ 2/9 Scores  PHQ - 2 Score 0   PHQ- 9 Score 4      Information is confidential and restricted. Go to Review Flowsheets to unlock data.    Vision:Within last year and Dental: No current dental problems and Receives regular dental care  Past Medical History:  Diagnosis Date   Anemia    Anxiety    Asthma    Bipolar disorder (HCC)    Cervix prolapsed into vagina    Depression    Former smoker    Itching in the vaginal area 01/09/2016   OCD (obsessive compulsive disorder)    Supervision of normal pregnancy in second trimester 05/11/2015    [facility]iated Care at   05/11/15 FOB Kerney Elbe 31 yo Dating By  LMP and [place] Pap  05/11/15 GC/CT Initial:                36+wks: Genetic Screen NT/IT:  CF screen  Anatomic [place]  Flu vaccine  Tdap Recommended ~ 28wks Glucose  Screen  2 hr GBS  Feed Preference  Contraception  Circumcision  Childbirth Classes  Pediatrician     Vaginal discharge 01/09/2016   Yeast infection 01/09/2016      Patient Care Team: Gabriel Earing, FNP as PCP - General (Family Medicine) Jena Gauss Gerrit Friends, MD as Consulting Physician (Gastroenterology)   Outpatient Medications Prior to Visit  Medication Sig   ARIPiprazole (ABILIFY) 10 MG tablet Take 10 mg by mouth daily.   lamoTRIgine (LAMICTAL) 200 MG tablet Take 200 mg by mouth daily.   LORazepam (ATIVAN) 0.5 MG tablet Take 0.5 mg by mouth every 6 (six) hours as needed for anxiety.   methylphenidate 27 MG PO CR tablet Take 27 mg by mouth every morning.   pantoprazole (PROTONIX) 20 MG tablet Take 1 tablet (20 mg total) by mouth daily.   QUEtiapine (SEROQUEL) 50 MG tablet Take 50 mg by mouth at bedtime.   sildenafil (VIAGRA) 25 MG tablet Take 25 mg by mouth daily as needed for erectile dysfunction.   No facility-administered medications prior to visit.    ROS Negative unless specially indicated above in HPI.  Objective:     BP 101/67   Pulse 79   Temp 98.2 F (36.8 C)   Wt 181 lb (82.1 kg)   LMP 09/13/2022 (Exact Date)   SpO2 95%   BMI 34.20 kg/m  Wt Readings from Last 3 Encounters:  09/13/22 181 lb (82.1 kg)  08/02/22 184 lb 9.6 oz (83.7 kg)  08/08/20 176 lb (79.8 kg)      Physical Exam Vitals and nursing note reviewed.  Constitutional:      General: She is not in acute distress.    Appearance: Normal appearance. She is obese. She is not ill-appearing.  HENT:     Head: Normocephalic.     Right Ear: Tympanic membrane, ear canal and external ear normal.     Left Ear: Tympanic membrane, ear canal and external ear normal.     Nose: Nose normal.     Mouth/Throat:     Mouth: Mucous membranes are moist.     Pharynx: Oropharynx is clear.  Eyes:     Extraocular Movements: Extraocular movements intact.     Conjunctiva/sclera: Conjunctivae normal.     Pupils: Pupils  are equal, round, and reactive to light.  Neck:     Thyroid: No thyroid mass, thyromegaly or thyroid tenderness.  Cardiovascular:     Rate and Rhythm: Normal rate and regular rhythm.     Pulses: Normal pulses.     Heart sounds: Normal heart sounds. No murmur heard.    No friction rub. No gallop.  Pulmonary:     Effort: Pulmonary effort is normal.     Breath sounds: Normal breath sounds.  Abdominal:     General: Bowel sounds are normal. There is no distension.     Palpations: Abdomen is soft. There is no mass.     Tenderness: There is no abdominal tenderness. There is no guarding.  Musculoskeletal:     Cervical back: Neck supple. No tenderness.     Right lower leg: No edema.     Left lower leg: No edema.  Skin:    General: Skin is warm and dry.     Capillary Refill: Capillary refill takes less than 2 seconds.     Findings: No lesion or rash.  Neurological:     General: No focal deficit present.     Mental Status: She is alert and oriented to person, place, and time.     Cranial Nerves: No cranial nerve deficit.     Motor: No weakness.     Gait: Gait normal.  Psychiatric:        Mood and Affect: Mood normal.        Behavior: Behavior normal.        Thought Content: Thought content normal.        Judgment: Judgment normal.      No results found for any visits on 09/13/22.     Assessment & Plan:    Routine Health Maintenance and Physical Exam Cheryel was seen today for annual exam.  Diagnoses and all orders for this visit:  Routine general medical examination at a health care facility Reviewed labs done 6 weeks ago.   Gastroesophageal reflux disease, unspecified whether esophagitis present Well controlled on current regimen. Continue protonix.   Class 1 obesity due to excess calories with serious comorbidity and body mass index (BMI) of 34.0 to 34.9 in adult BMI 34. Discussed diet and exercise. She will check on coverage for wegovy. She is aware that this is on  backorder  as well. Also discussed referral to health weight and wellness and/or nutrition if insurance does not cover Wegovy.   OSA (obstructive sleep apnea) Managed by [facility].    Immunization History  Administered Date(s) Administered   Influenza,inj,Quad PF,6+ Mos 11/14/2017, 06/20/2019   Influenza-Unspecified 08/10/2015, 11/14/2017, 09/03/2018, 06/20/2019   Moderna Sars-Covid-2 Vaccination 01/28/2020, 03/02/2020   Tdap 08/10/2015    Health Maintenance  Topic Date Due   PAP SMEAR-Modifier  05/10/2018   COVID-19 Vaccine (3 - Moderna series) 09/29/2022 (Originally 04/27/2020)   INFLUENZA VACCINE  02/03/2023 (Originally 06/05/2022)   Hepatitis C Screening  08/03/2023 (Originally 10/13/2007)   TETANUS/TDAP  08/09/2025   HIV Screening  Completed   HPV VACCINES  Aged Out    Discussed health benefits of physical activity, and encouraged her to engage in regular exercise appropriate for her age and condition.  Problem List Items Addressed This Visit       Respiratory   OSA (obstructive sleep apnea)     Digestive   Gastroesophageal reflux disease     Other   Class 1 obesity due to excess calories with serious comorbidity and body mass index (BMI) of 34.0 to 34.9 in adult   Other Visit Diagnoses     Routine general medical examination at a health care facility    -  Primary      Return in about 6 months (around 03/14/2023) for chronic follow up with Pap.   The patient indicates understanding of these issues and agrees with the plan.   Gabriel Earing, FNP

## 2022-09-13 NOTE — Patient Instructions (Addendum)
Here is a guide to help us find out which weight loss medications will be covered by your insurance plan.  Please check out this web site  [employer] and follow the 3 simple steps.   There is also a phone number you can call if you do not have access to the Internet. [phone] (Monday- Friday 8am-8pm)  [employer] provides coverage information for more than 80% of the inquiries submitted!!   Health Maintenance, Female Adopting a healthy lifestyle and getting preventive care are important in promoting health and wellness. Ask your health care provider about: The right schedule for you to have regular tests and exams. Things you can do on your own to prevent diseases and keep yourself healthy. What should I know about diet, weight, and exercise? Eat a healthy diet  Eat a diet that includes plenty of vegetables, fruits, low-fat dairy products, and lean protein. Do not eat a lot of foods that are high in solid fats, added sugars, or sodium. Maintain a healthy weight Body mass index (BMI) is used to identify weight problems. It estimates body fat based on height and weight. Your health care provider can help determine your BMI and help you achieve or maintain a healthy weight. Get regular exercise Get regular exercise. This is one of the most important things you can do for your health. Most adults should: Exercise for at least 150 minutes each week. The exercise should increase your heart rate and make you sweat (moderate-intensity exercise). Do strengthening exercises at least twice a week. This is in addition to the moderate-intensity exercise. Spend less time sitting. Even light physical activity can be beneficial. Watch cholesterol and blood lipids Have your blood tested for lipids and cholesterol at 33 years of age, then have this test every 5 years. Have your cholesterol levels checked more often if: Your lipid or cholesterol levels are high. You are older than 33 years of  age. You are at high risk for heart disease. What should I know about cancer screening? Depending on your health history and family history, you may need to have cancer screening at various ages. This may include screening for: Breast cancer. Cervical cancer. Colorectal cancer. Skin cancer. Lung cancer. What should I know about heart disease, diabetes, and high blood pressure? Blood pressure and heart disease High blood pressure causes heart disease and increases the risk of stroke. This is more likely to develop in people who have high blood pressure readings or are overweight. Have your blood pressure checked: Every 3-5 years if you are 18-39 years of age. Every year if you are 40 years old or older. Diabetes Have regular diabetes screenings. This checks your fasting blood sugar level. Have the screening done: Once every three years after age 40 if you are at a normal weight and have a low risk for diabetes. More often and at a younger age if you are overweight or have a high risk for diabetes. What should I know about preventing infection? Hepatitis B If you have a higher risk for hepatitis B, you should be screened for this virus. Talk with your health care provider to find out if you are at risk for hepatitis B infection. Hepatitis C Testing is recommended for: Everyone born from 1945 through 1965. Anyone with known risk factors for hepatitis C. Sexually transmitted infections (STIs) Get screened for STIs, including gonorrhea and chlamydia, if: You are sexually active and are younger than 33 years of age. You are older than 33 years of   age and your health care provider tells you that you are at risk for this type of infection. Your sexual activity has changed since you were last screened, and you are at increased risk for chlamydia or gonorrhea. Ask your health care provider if you are at risk. Ask your health care provider about whether you are at high risk for HIV. Your health  care provider may recommend a prescription medicine to help prevent HIV infection. If you choose to take medicine to prevent HIV, you should first get tested for HIV. You should then be tested every 3 months for as long as you are taking the medicine. Pregnancy If you are about to stop having your period (premenopausal) and you may become pregnant, seek counseling before you get pregnant. Take 400 to 800 micrograms (mcg) of folic acid every day if you become pregnant. Ask for birth control (contraception) if you want to prevent pregnancy. Osteoporosis and menopause Osteoporosis is a disease in which the bones lose minerals and strength with aging. This can result in bone fractures. If you are 65 years old or older, or if you are at risk for osteoporosis and fractures, ask your health care provider if you should: Be screened for bone loss. Take a calcium or vitamin D supplement to lower your risk of fractures. Be given hormone replacement therapy (HRT) to treat symptoms of menopause. Follow these instructions at home: Alcohol use Do not drink alcohol if: Your health care provider tells you not to drink. You are pregnant, may be pregnant, or are planning to become pregnant. If you drink alcohol: Limit how much you have to: 0-1 drink a day. Know how much alcohol is in your drink. In the [place] one drink equals one 12 oz bottle of beer (355 mL), one 5 oz glass of wine (148 mL), or one 1 oz glass of hard liquor (44 mL). Lifestyle Do not use any products that contain nicotine or tobacco. These products include cigarettes, chewing tobacco, and vaping devices, such as e-cigarettes. If you need help quitting, ask your health care provider. Do not use street drugs. Do not share needles. Ask your health care provider for help if you need support or information about quitting drugs. General instructions Schedule regular health, dental, and eye exams. Stay current with your vaccines. Tell your health  care provider if: You often feel depressed. You have ever been abused or do not feel safe at home. Summary Adopting a healthy lifestyle and getting preventive care are important in promoting health and wellness. Follow your health care provider's instructions about healthy diet, exercising, and getting tested or screened for diseases. Follow your health care provider's instructions on monitoring your cholesterol and blood pressure. This information is not intended to replace advice given to you by your health care provider. Make sure you discuss any questions you have with your health care provider. Document Revised: 03/13/2021 Document Reviewed: 03/13/2021 Elsevier Patient Education  2023 [employer].  

## 2022-09-18 ENCOUNTER — Encounter (INDEPENDENT_AMBULATORY_CARE_PROVIDER_SITE_OTHER): Payer: Self-pay

## 2022-10-04 ENCOUNTER — Encounter (INDEPENDENT_AMBULATORY_CARE_PROVIDER_SITE_OTHER): Payer: 59 | Admitting: Internal Medicine

## 2022-11-08 ENCOUNTER — Encounter: Payer: Self-pay | Admitting: Family Medicine

## 2022-11-09 ENCOUNTER — Other Ambulatory Visit: Payer: Self-pay | Admitting: Family Medicine

## 2022-11-09 DIAGNOSIS — E6609 Other obesity due to excess calories: Secondary | ICD-10-CM

## 2022-12-07 ENCOUNTER — Encounter: Payer: Self-pay | Admitting: *Deleted

## 2023-01-10 ENCOUNTER — Encounter (INDEPENDENT_AMBULATORY_CARE_PROVIDER_SITE_OTHER): Payer: 59 | Admitting: Internal Medicine

## 2023-02-21 ENCOUNTER — Ambulatory Visit: Payer: 59 | Admitting: Family Medicine

## 2023-02-21 ENCOUNTER — Encounter: Payer: Self-pay | Admitting: Family Medicine

## 2023-02-21 VITALS — BP 98/61 | HR 83 | Temp 98.4°F | Ht 61.0 in | Wt 182.4 lb

## 2023-02-21 DIAGNOSIS — R140 Abdominal distension (gaseous): Secondary | ICD-10-CM | POA: Diagnosis not present

## 2023-02-21 NOTE — Progress Notes (Signed)
   Acute Office Visit  Subjective:     Patient ID: Katie Briggs, female    DOB: [birth year], 32 y.o.   MRN: [id number]  Chief Complaint  Patient presents with   Bloated    HPI Patient is in today for abdominal bloating. She reports that this has been ongoing for awhile. She recently has noticed that this is worse when she eats gluten foods. She also reports increased flatus. Her daughter is allergic to gluten and she is concerned that she may be as well. She denies diarrhea, constipation, abdominal pain, diarrhea, constipation, fever, belching, nausea, vomiting, or change in stools.    ROS As per HPI.      Objective:    BP 98/61   Pulse 83   Temp 98.4 F (36.9 C) (Temporal)   Ht  (1.549 m)   Wt 182 lb 6 oz (82.7 kg)   SpO2 96%   BMI 34.46 kg/m  Wt Readings from Last 3 Encounters:  02/21/23 182 lb 6 oz (82.7 kg)  09/13/22 181 lb (82.1 kg)  08/02/22 184 lb 9.6 oz (83.7 kg)      Physical Exam Vitals and nursing note reviewed.  Constitutional:      General: She is not in acute distress.    Appearance: She is obese. She is not ill-appearing, toxic-appearing or diaphoretic.  Cardiovascular:     Rate and Rhythm: Normal rate and regular rhythm.     Heart sounds: Normal heart sounds. No murmur heard. Pulmonary:     Effort: Pulmonary effort is normal. No respiratory distress.     Breath sounds: Normal breath sounds.  Abdominal:     General: Bowel sounds are normal. There is no distension.     Palpations: Abdomen is soft.     Tenderness: There is no abdominal tenderness. There is no guarding or rebound.  Musculoskeletal:     Right lower leg: No edema.     Left lower leg: No edema.  Skin:    General: Skin is warm and dry.  Neurological:     General: No focal deficit present.     Mental Status: She is alert and oriented to person, place, and time.  Psychiatric:        Mood and Affect: Mood normal.        Behavior: Behavior normal.     No results found for  any visits on 02/21/23.      Assessment & Plan:   Satonya was seen today for bloated.  Diagnoses and all orders for this visit:  Abdominal bloating Benign exam. No other symptoms. Will check celiac panel today as she reports symptoms are worse with gluten foods. Discussed food diary.  -     Celiac Disease Panel    Return if symptoms worsen or fail to improve.  The patient indicates understanding of these issues and agrees with the plan.   Gabriel Earing, FNP

## 2023-02-21 NOTE — Patient Instructions (Signed)
[facility] online weight loss

## 2023-02-23 LAB — CELIAC DISEASE PANEL
Endomysial IgA: NEGATIVE
IgA/Immunoglobulin A, Serum: 118 mg/dL (ref 87–352)
Transglutaminase IgA: <2 U/mL (ref 0–3)

## 2023-03-08 ENCOUNTER — Ambulatory Visit: Payer: 59 | Admitting: Family Medicine

## 2023-03-28 ENCOUNTER — Ambulatory Visit: Payer: 59 | Admitting: Family Medicine

## 2023-04-15 ENCOUNTER — Encounter: Payer: 59 | Admitting: Family Medicine

## 2023-05-27 ENCOUNTER — Ambulatory Visit (INDEPENDENT_AMBULATORY_CARE_PROVIDER_SITE_OTHER): Payer: No Typology Code available for payment source | Admitting: Family Medicine

## 2023-05-27 ENCOUNTER — Encounter: Payer: Self-pay | Admitting: Family Medicine

## 2023-05-27 VITALS — BP 114/79 | HR 81 | Temp 98.7°F | Ht 61.0 in | Wt 180.0 lb

## 2023-05-27 DIAGNOSIS — J40 Bronchitis, not specified as acute or chronic: Secondary | ICD-10-CM | POA: Diagnosis not present

## 2023-05-27 MED ORDER — ALBUTEROL SULFATE HFA 108 (90 BASE) MCG/ACT IN AERS: 2.0000 | INHALATION_SPRAY | Freq: Four times a day (QID) | RESPIRATORY_TRACT | 2 refills | Status: AC | PRN

## 2023-05-27 MED ORDER — BENZONATATE 100 MG PO CAPS: 100.0000 mg | ORAL_CAPSULE | Freq: Three times a day (TID) | ORAL | 0 refills | Status: DC | PRN

## 2023-05-27 NOTE — Progress Notes (Signed)
Acute Office Visit  Subjective:     Patient ID: Katie Briggs, female    DOB: [birth year], 32 y.o.   MRN: [id number]  Chief Complaint  Patient presents with   recheck for bronchitis    [place]. Still has a cough Raspy Still some weakness    HPI Patient is in today for follow up of bronchitis. [place], around [place]10[place]23. She had wheezing, shortness of breath, and a dry cough. She was given duomax and something for the cough. She reports that she feels much better now. She continues to have a dry cough, but much milder. Cough typically occurs a few times for day and increases with activity. Denies fever, shortness of breath. Feels a little wheezy if she exhales a deep breath or with increased activity. Reports hx of asthma as a child. She is not taking anything for her symptoms.   ROS As per HPI.      Objective:    BP 114/79   Pulse 81   Temp 98.7 F (37.1 C)   Ht 5\' 1"  (1.549 m)   Wt 180 lb (81.6 kg)   LMP 05/26/2023 (Exact Date)   SpO2 94%   BMI 34.01 kg/m    Physical Exam Vitals and nursing note reviewed.  Constitutional:      General: She is not in acute distress.    Appearance: She is obese. She is not ill-appearing, toxic-appearing or diaphoretic.  HENT:     Right Ear: Tympanic membrane, ear canal and external ear normal.     Left Ear: Tympanic membrane, ear canal and external ear normal.     Nose: Nose normal.     Mouth/Throat:     Mouth: Mucous membranes are moist.     Pharynx: Oropharynx is clear.  Cardiovascular:     Rate and Rhythm: Normal rate and regular rhythm.     Heart sounds: Normal heart sounds. No murmur heard. Pulmonary:     Effort: Pulmonary effort is normal. No respiratory distress.     Breath sounds: Normal breath sounds. No wheezing, rhonchi or rales.  Musculoskeletal:     Cervical back: Neck supple. No rigidity.     Right lower leg: No edema.     Left lower leg: No  edema.  Skin:    General: Skin is warm and dry.  Neurological:     General: No focal deficit present.     Mental Status: She is alert and oriented to person, place, and time.  Psychiatric:        Mood and Affect: Mood normal.        Behavior: Behavior normal.     No results found for any visits on 05/27/23.      Assessment & Plan:   Lyriq was seen today for recheck for bronchitis.  Diagnoses and all orders for this visit:  Bronchitis Improving. Albuterol prn for wheezing, shortness of breath. Tessalon perles prn for cough. Discussed that cough may linger for weeks. Return to office for new or worsening symptoms, or if symptoms persist.  -     albuterol (VENTOLIN HFA) 108 (90 Base) MCG/ACT inhaler; Inhale 2 puffs into the lungs every 6 (six) hours as needed for wheezing or shortness of breath. -     benzonatate (TESSALON PERLES) 100 MG capsule; Take 1 capsule (100 mg total) by mouth 3 (three) times daily as needed for cough.  The patient indicates understanding of these issues  and agrees with the plan.  Gabriel Earing, FNP

## 2023-07-01 ENCOUNTER — Encounter: Payer: Self-pay | Admitting: Family Medicine

## 2023-07-12 ENCOUNTER — Ambulatory Visit (INDEPENDENT_AMBULATORY_CARE_PROVIDER_SITE_OTHER): Payer: No Typology Code available for payment source | Admitting: Family Medicine

## 2023-07-12 ENCOUNTER — Encounter: Payer: Self-pay | Admitting: Family Medicine

## 2023-07-12 VITALS — BP 97/68 | HR 64 | Temp 98.1°F | Ht 61.0 in | Wt 181.2 lb

## 2023-07-12 DIAGNOSIS — N926 Irregular menstruation, unspecified: Secondary | ICD-10-CM

## 2023-07-12 DIAGNOSIS — R232 Flushing: Secondary | ICD-10-CM

## 2023-07-12 NOTE — Progress Notes (Signed)
   Acute Office Visit  Subjective:     Patient ID: Katie Briggs, female    DOB: February 02, 1991, 32 y.o.   MRN: [id number]  Chief Complaint  Patient presents with   Menstrual Problem   Hot Flashes    HPI Patient is in today for hot flashes for 2-3 months. She reports intermittently getting hot with sweating throughout the day. She wakes up hot at night and has to take off the covers. She isn't waking up drenched in sweat.   Reports that her cycles has been irregular. Her last cycle was 35 days and she has some spotting between cycles. She has had her tubes removed. Reports that her mother went through menopause early. Her last cycle was 07/01/23. Last cycle was 4-5 days and heavy than her usual. Typically has cycles that are 3 days.   Denies changes in medications. Denies changes in hair, skin, nails, appetite, or weight.   ROS As per HPI.     Objective:    BP 97/68   Pulse 64   Temp 98.1 F (36.7 C) (Temporal)   Ht 5\' 1"  (1.549 m)   Wt 181 lb 4 oz (82.2 kg)   SpO2 97%   BMI 34.25 kg/m    Physical Exam Vitals and nursing note reviewed.  Constitutional:      General: She is not in acute distress.    Appearance: She is obese. She is not ill-appearing, toxic-appearing or diaphoretic.  Neck:     Thyroid: No thyroid mass, thyromegaly or thyroid tenderness.  Cardiovascular:     Rate and Rhythm: Normal rate and regular rhythm.     Heart sounds: Normal heart sounds. No murmur heard. Pulmonary:     Effort: No respiratory distress.     Breath sounds: Normal breath sounds.  Musculoskeletal:     Right lower leg: No edema.     Left lower leg: No edema.  Neurological:     General: No focal deficit present.     Mental Status: She is alert and oriented to person, place, and time.  Psychiatric:        Mood and Affect: Mood normal.        Behavior: Behavior normal.     No results found for any visits on 07/12/23.      Assessment & Plan:   Shimere was seen today for  menstrual problem and hot flashes.  Diagnoses and all orders for this visit:  Irregular menstrual cycle Hot flashes Will check labs as below. Discussed referral to gyn pending labs. -     Estradiol -     FSH/LH -     TSH -     T4, Free -     CBC with Differential/Platelet -     CMP14+EGFR   Return in about 6 months (around 01/09/2024) for CPE.  The patient indicates understanding of these issues and agrees with the plan.  Gabriel Earing, FNP

## 2023-07-13 LAB — CBC WITH DIFFERENTIAL/PLATELET
Basophils Absolute: 0.1 10*3/uL (ref 0.0–0.2)
Basos: 1 %
EOS (ABSOLUTE): 0.6 10*3/uL — ABNORMAL HIGH (ref 0.0–0.4)
Eos: 6 %
Hematocrit: 39.8 % (ref 34.0–46.6)
Hemoglobin: 13.4 g/dL (ref 11.1–15.9)
Immature Grans (Abs): 0 10*3/uL (ref 0.0–0.1)
Immature Granulocytes: 0 %
Lymphocytes Absolute: 2.5 10*3/uL (ref 0.7–3.1)
Lymphs: 28 %
MCH: 30.7 pg (ref 26.6–33.0)
MCHC: 33.7 g/dL (ref 31.5–35.7)
MCV: 91 fL (ref 79–97)
Monocytes Absolute: 0.5 10*3/uL (ref 0.1–0.9)
Monocytes: 6 %
Neutrophils Absolute: 5.2 10*3/uL (ref 1.4–7.0)
Neutrophils: 59 %
Platelets: 317 10*3/uL (ref 150–450)
RBC: 4.37 x10E6/uL (ref 3.77–5.28)
RDW: 13.3 % (ref 11.7–15.4)
WBC: 8.9 10*3/uL (ref 3.4–10.8)

## 2023-07-13 LAB — CMP14+EGFR
ALT: 18 IU/L (ref 0–32)
AST: 16 IU/L (ref 0–40)
Albumin: 4 g/dL (ref 3.9–4.9)
Alkaline Phosphatase: 91 IU/L (ref 44–121)
BUN/Creatinine Ratio: 9 (ref 9–23)
BUN: 6 mg/dL (ref 6–20)
Bilirubin Total: 0.3 mg/dL (ref 0.0–1.2)
CO2: 24 mmol/L (ref 20–29)
Calcium: 9 mg/dL (ref 8.7–10.2)
Chloride: 103 mmol/L (ref 96–106)
Creatinine, Ser: 0.67 mg/dL (ref 0.57–1.00)
Globulin, Total: 2.2 g/dL (ref 1.5–4.5)
Glucose: 83 mg/dL (ref 70–99)
Potassium: 4 mmol/L (ref 3.5–5.2)
Sodium: 141 mmol/L (ref 134–144)
Total Protein: 6.2 g/dL (ref 6.0–8.5)
eGFR: 118 mL/min/{1.73_m2} (ref >59)

## 2023-07-13 LAB — T4, FREE: Free T4: 1.49 ng/dL (ref 0.82–1.77)

## 2023-07-13 LAB — TSH: TSH: 0.964 u[IU]/mL (ref 0.450–4.500)

## 2023-07-13 LAB — FSH/LH
FSH: 5.4 m[IU]/mL
LH: 10.5 m[IU]/mL

## 2023-07-13 LAB — ESTRADIOL: Estradiol: 183 pg/mL

## 2023-07-15 ENCOUNTER — Emergency Department (HOSPITAL_COMMUNITY): Payer: No Typology Code available for payment source

## 2023-07-15 ENCOUNTER — Encounter (HOSPITAL_COMMUNITY): Payer: Self-pay | Admitting: Emergency Medicine

## 2023-07-15 ENCOUNTER — Emergency Department (HOSPITAL_COMMUNITY)
Admission: EM | Admit: 2023-07-15 | Discharge: 2023-07-16 | Disposition: A | Discharge status: Home / Self Care | Payer: No Typology Code available for payment source | Attending: Emergency Medicine | Admitting: Emergency Medicine

## 2023-07-15 ENCOUNTER — Other Ambulatory Visit: Payer: Self-pay

## 2023-07-15 DIAGNOSIS — M542 Cervicalgia: Secondary | ICD-10-CM | POA: Insufficient documentation

## 2023-07-15 DIAGNOSIS — Y92410 Unspecified street and highway as the place of occurrence of the external cause: Secondary | ICD-10-CM | POA: Insufficient documentation

## 2023-07-15 DIAGNOSIS — M79632 Pain in left forearm: Secondary | ICD-10-CM | POA: Diagnosis present

## 2023-07-15 DIAGNOSIS — S5012XA Contusion of left forearm, initial encounter: Secondary | ICD-10-CM | POA: Diagnosis not present

## 2023-07-15 DIAGNOSIS — R079 Chest pain, unspecified: Secondary | ICD-10-CM | POA: Diagnosis not present

## 2023-07-15 DIAGNOSIS — S40022A Contusion of left upper arm, initial encounter: Secondary | ICD-10-CM | POA: Diagnosis not present

## 2023-07-15 DIAGNOSIS — R109 Unspecified abdominal pain: Secondary | ICD-10-CM | POA: Insufficient documentation

## 2023-07-15 LAB — CBC WITH DIFFERENTIAL/PLATELET
Abs Immature Granulocytes: 0.05 10*3/uL (ref 0.00–0.07)
Basophils Absolute: 0.1 10*3/uL (ref 0.0–0.1)
Basophils Relative: 0 %
Eosinophils Absolute: 0.4 10*3/uL (ref 0.0–0.5)
Eosinophils Relative: 3 %
HCT: 40.6 % (ref 36.0–46.0)
Hemoglobin: 13.5 g/dL (ref 12.0–15.0)
Immature Granulocytes: 0 %
Lymphocytes Relative: 16 %
Lymphs Abs: 2.2 10*3/uL (ref 0.7–4.0)
MCH: 30.2 pg (ref 26.0–34.0)
MCHC: 33.3 g/dL (ref 30.0–36.0)
MCV: 90.8 fL (ref 80.0–100.0)
Monocytes Absolute: 0.7 10*3/uL (ref 0.1–1.0)
Monocytes Relative: 5 %
Neutro Abs: 10.4 10*3/uL — ABNORMAL HIGH (ref 1.7–7.7)
Neutrophils Relative %: 76 %
Platelets: 316 10*3/uL (ref 150–400)
RBC: 4.47 MIL/uL (ref 3.87–5.11)
RDW: 13.3 % (ref 11.5–15.5)
WBC: 13.7 10*3/uL — ABNORMAL HIGH (ref 4.0–10.5)
nRBC: 0 % (ref 0.0–0.2)

## 2023-07-15 LAB — COMPREHENSIVE METABOLIC PANEL
ALT: 22 U/L (ref 0–44)
AST: 17 U/L (ref 15–41)
Albumin: 3.9 g/dL (ref 3.5–5.0)
Alkaline Phosphatase: 69 U/L (ref 38–126)
Anion gap: 8 (ref 5–15)
BUN: 13 mg/dL (ref 6–20)
CO2: 23 mmol/L (ref 22–32)
Calcium: 8.9 mg/dL (ref 8.9–10.3)
Chloride: 107 mmol/L (ref 98–111)
Creatinine, Ser: 0.84 mg/dL (ref 0.44–1.00)
GFR, Estimated: >60 mL/min (ref >60)
Glucose, Bld: 97 mg/dL (ref 70–99)
Potassium: 3.9 mmol/L (ref 3.5–5.1)
Sodium: 138 mmol/L (ref 135–145)
Total Bilirubin: 0.6 mg/dL (ref 0.3–1.2)
Total Protein: 7.1 g/dL (ref 6.5–8.1)

## 2023-07-15 LAB — HCG, QUANTITATIVE, PREGNANCY: hCG, Beta Chain, Quant, S: <1 m[IU]/mL (ref <5)

## 2023-07-15 MED ORDER — ONDANSETRON HCL 4 MG/2ML IJ SOLN
4.0000 mg | Freq: Once | INTRAMUSCULAR | Status: AC
  Administered 2023-07-15: 4 mg via INTRAVENOUS
  Filled 2023-07-15: qty 2

## 2023-07-15 MED ORDER — ASPIRIN 81 MG PO CHEW: 324.0000 mg | CHEWABLE_TABLET | Freq: Once | ORAL | Status: DC

## 2023-07-15 MED ORDER — MORPHINE SULFATE (PF) 4 MG/ML IV SOLN
4.0000 mg | Freq: Once | INTRAVENOUS | Status: AC
  Administered 2023-07-15: 4 mg via INTRAVENOUS
  Filled 2023-07-15: qty 1

## 2023-07-15 MED ORDER — IOHEXOL 300 MG/ML  SOLN
100.0000 mL | Freq: Once | INTRAMUSCULAR | Status: AC | PRN
  Administered 2023-07-15: 100 mL via INTRAVENOUS

## 2023-07-15 MED ORDER — HEPARIN (PORCINE) 25000 UT/250ML-% IV SOLN: 12.0000 [IU]/kg/h | INTRAVENOUS | Status: DC
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
## 2023-07-15 NOTE — ED Notes (Signed)
Patient anxious and tearful. Family member back to visit with patient for support, and emotional support provided by multiple nurses
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
## 2023-07-16 LAB — CBC
HCT: 37.6 % (ref 36.0–46.0)
Hemoglobin: 12.6 g/dL (ref 12.0–15.0)
MCH: 30.4 pg (ref 26.0–34.0)
MCHC: 33.5 g/dL (ref 30.0–36.0)
MCV: 90.8 fL (ref 80.0–100.0)
Platelets: 293 10*3/uL (ref 150–400)
RBC: 4.14 MIL/uL (ref 3.87–5.11)
RDW: 13.5 % (ref 11.5–15.5)
WBC: 13.9 10*3/uL — ABNORMAL HIGH (ref 4.0–10.5)
nRBC: 0 % (ref 0.0–0.2)

## 2023-07-16 MED ORDER — KETOROLAC TROMETHAMINE 30 MG/ML IJ SOLN
30.0000 mg | Freq: Once | INTRAMUSCULAR | Status: AC
  Administered 2023-07-16: 30 mg via INTRAVENOUS
  Filled 2023-07-16: qty 1

## 2023-07-16 NOTE — Discharge Instructions (Addendum)
While you were in the emergency department, you had CT imaging done that was normal.  Your x-rays were also negative.  Tomorrow, you will likely be more sore than you are today.  That is normal.  You can take Tylenol and ibuprofen.  Follow-up with your primary care doctor within 1 week.  Come back to the emergency department if you are unable to walk, or lose consciousness.
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
## 2023-07-19 ENCOUNTER — Other Ambulatory Visit: Payer: Self-pay | Admitting: Family Medicine

## 2023-07-19 DIAGNOSIS — K219 Gastro-esophageal reflux disease without esophagitis: Secondary | ICD-10-CM

## 2023-07-25 ENCOUNTER — Encounter: Payer: Self-pay | Admitting: *Deleted

## 2024-01-09 ENCOUNTER — Ambulatory Visit (INDEPENDENT_AMBULATORY_CARE_PROVIDER_SITE_OTHER): Payer: No Typology Code available for payment source | Admitting: Family Medicine

## 2024-01-09 ENCOUNTER — Encounter: Payer: Self-pay | Admitting: Family Medicine

## 2024-01-09 VITALS — BP 108/65 | HR 93 | Temp 97.9°F | Ht 61.0 in | Wt 182.6 lb

## 2024-01-09 DIAGNOSIS — N3946 Mixed incontinence: Secondary | ICD-10-CM | POA: Diagnosis not present

## 2024-01-09 DIAGNOSIS — K219 Gastro-esophageal reflux disease without esophagitis: Secondary | ICD-10-CM

## 2024-01-09 DIAGNOSIS — G4726 Circadian rhythm sleep disorder, shift work type: Secondary | ICD-10-CM

## 2024-01-09 DIAGNOSIS — Z8742 Personal history of other diseases of the female genital tract: Secondary | ICD-10-CM | POA: Diagnosis not present

## 2024-01-09 DIAGNOSIS — Z0001 Encounter for general adult medical examination with abnormal findings: Secondary | ICD-10-CM | POA: Diagnosis not present

## 2024-01-09 DIAGNOSIS — Z124 Encounter for screening for malignant neoplasm of cervix: Secondary | ICD-10-CM | POA: Diagnosis not present

## 2024-01-09 DIAGNOSIS — E66811 Obesity, class 1: Secondary | ICD-10-CM

## 2024-01-09 DIAGNOSIS — F909 Attention-deficit hyperactivity disorder, unspecified type: Secondary | ICD-10-CM

## 2024-01-09 DIAGNOSIS — F3130 Bipolar disorder, current episode depressed, mild or moderate severity, unspecified: Secondary | ICD-10-CM

## 2024-01-09 DIAGNOSIS — Z0000 Encounter for general adult medical examination without abnormal findings: Secondary | ICD-10-CM

## 2024-01-09 DIAGNOSIS — Z6834 Body mass index (BMI) 34.0-34.9, adult: Secondary | ICD-10-CM

## 2024-01-09 MED ORDER — PANTOPRAZOLE SODIUM 20 MG PO TBEC
20.0000 mg | DELAYED_RELEASE_TABLET | Freq: Every day | ORAL | 3 refills | Status: AC
Start: 2024-01-09 — End: ?

## 2024-01-09 NOTE — Progress Notes (Signed)
 Complete physical exam  Patient: Katie Briggs   DOB: [birth year]   32 y.o. Female  MRN: [id number]  Subjective:    Chief Complaint  Patient presents with   Annual Exam    Katie Briggs is a 33 y.o. female who presents today for a complete physical exam. She reports consuming a general diet. She is working with a Systems analyst once a week. She generally feels fairly well. She reports sleeping fairly well. She does have additional problems to discuss today.   Increasing urinary incontinence since first pregnancy. Gradually worsening. Mixed urge and stress incontinence. Hx of uterine prolapse. Having to wear incontinence pads now.   Established with [facility]. They are working on adjusting medications.   Most recent fall risk assessment:    01/09/2024   10:51 AM  Fall Risk   Falls in the past year? 0     Most recent depression screenings:    01/09/2024   10:52 AM 07/12/2023    1:04 PM 05/27/2023    4:15 PM  Depression screen PHQ 2/9  Decreased Interest 0 1 0  Down, Depressed, Hopeless 0 0 0  PHQ - 2 Score 0 1 0  Altered sleeping 1 1 1   Tired, decreased energy 1 1 1   Change in appetite 1 2 3   Feeling bad or failure about yourself  0 0 1  Trouble concentrating 3 1 2   Moving slowly or fidgety/restless 0 1 0  Suicidal thoughts 0 0 0  PHQ-9 Score 6 7 8   Difficult doing work/chores Somewhat difficult Somewhat difficult       01/09/2024   10:52 AM 07/12/2023    1:02 PM 05/27/2023    4:15 PM 02/21/2023    9:15 AM  GAD 7 : Generalized Anxiety Score  Nervous, Anxious, on Edge 1 1 1 1   Control/stop worrying 0 0 1 1  Worry too much - different things 1 0 1 0  Trouble relaxing 2 1 1 1   Restless 2 0 1 0  Easily annoyed or irritable 1 0 2 1  Afraid - awful might happen 2 0 0 0  Total GAD 7 Score 9 2 7 4   Anxiety Difficulty Somewhat difficult Somewhat difficult Somewhat difficult Somewhat difficult      Vision:Within last year and Dental: No current dental problems and No regular  dental care   Past Medical History:  Diagnosis Date   Anemia    Anxiety    Asthma    Bipolar disorder (HCC)    Cervix prolapsed into vagina    Depression    Former smoker    Itching in the vaginal area 01/09/2016   OCD (obsessive compulsive disorder)    Supervision of normal pregnancy in second trimester 05/11/2015    [facility] at   05/11/15 FOB Kerney Elbe 33 yo Dating By  LMP and [place] Pap  05/11/15 GC/CT Initial:                36+wks: Genetic Screen NT/IT:  CF screen  Anatomic [place]  Flu vaccine  Tdap Recommended ~ 28wks Glucose Screen  2 hr GBS  Feed Preference  Contraception  Circumcision  Childbirth Classes  Pediatrician     Vaginal discharge 01/09/2016   Yeast infection 01/09/2016      Patient Care Team: Gabriel Earing, FNP as PCP - General (Family Medicine) Jena Gauss Gerrit Friends, MD as Consulting Physician (Gastroenterology)   Outpatient Medications Prior to Visit  Medication Sig  albuterol (VENTOLIN HFA) 108 (90 Base) MCG/ACT inhaler Inhale 2 puffs into the lungs every 6 (six) hours as needed for wheezing or shortness of breath.   amphetamine-dextroamphetamine (ADDERALL XR) 15 MG 24 hr capsule Take 15 mg by mouth every morning.   ARIPiprazole (ABILIFY) 2 MG tablet Take 4 mg by mouth daily.   lamoTRIgine (LAMICTAL) 200 MG tablet Take 200 mg by mouth daily.   LORazepam (ATIVAN) 0.5 MG tablet Take 0.5 mg by mouth every 6 (six) hours as needed for anxiety.   pantoprazole (PROTONIX) 20 MG tablet TAKE ONE TABLET ONCE DAILY   QUEtiapine (SEROQUEL) 50 MG tablet Take 50 mg by mouth at bedtime.   sildenafil (VIAGRA) 25 MG tablet Take 25 mg by mouth daily as needed for erectile dysfunction.   [DISCONTINUED] ARIPiprazole (ABILIFY) 10 MG tablet Take 10 mg by mouth daily.   [DISCONTINUED] lisdexamfetamine (VYVANSE) 20 MG capsule Take 20 mg by mouth every morning.   No facility-administered medications prior to visit.    ROS Negative unless specially indicated above in  HPI.      Objective:     BP 108/65   Pulse 93   Temp 97.9 F (36.6 C) (Temporal)   Ht 5\' 1"  (1.549 m)   Wt 182 lb 9.6 oz (82.8 kg)   SpO2 96%   BMI 34.50 kg/m    Physical Exam Vitals and nursing note reviewed.  Constitutional:      General: She is not in acute distress.    Appearance: She is obese. She is not ill-appearing, toxic-appearing or diaphoretic.  HENT:     Head: Normocephalic.     Right Ear: Tympanic membrane, ear canal and external ear normal.     Left Ear: Tympanic membrane, ear canal and external ear normal.     Nose: Nose normal.     Mouth/Throat:     Mouth: Mucous membranes are moist.     Pharynx: Oropharynx is clear.  Eyes:     Extraocular Movements: Extraocular movements intact.     Conjunctiva/sclera: Conjunctivae normal.     Pupils: Pupils are equal, round, and reactive to light.  Cardiovascular:     Rate and Rhythm: Normal rate and regular rhythm.     Pulses: Normal pulses.     Heart sounds: Normal heart sounds. No murmur heard.    No friction rub. No gallop.  Pulmonary:     Effort: Pulmonary effort is normal.     Breath sounds: Normal breath sounds.  Abdominal:     General: Bowel sounds are normal. There is no distension.     Palpations: Abdomen is soft. There is no mass.     Tenderness: There is no abdominal tenderness. There is no guarding.  Musculoskeletal:     Cervical back: Normal range of motion and neck supple. No tenderness.     Right lower leg: No edema.     Left lower leg: No edema.  Skin:    General: Skin is warm and dry.     Capillary Refill: Capillary refill takes less than 2 seconds.     Findings: No lesion or rash.  Neurological:     General: No focal deficit present.     Mental Status: She is alert and oriented to person, place, and time.     Cranial Nerves: No cranial nerve deficit.     Motor: No weakness.     Gait: Gait normal.  Psychiatric:        Mood and Affect: Mood normal.  Behavior: Behavior normal.         Thought Content: Thought content normal.        Judgment: Judgment normal.      No results found for any visits on 01/09/24.     Assessment & Plan:    Routine Health Maintenance and Physical Exam  Katie Briggs was seen today for annual exam.  Diagnoses and all orders for this visit:  Routine general medical examination at a health care facility  Mixed urge and stress incontinence Declined medication trial today. Referral to GYN discussed and placed.  -     Ambulatory referral to Obstetrics / Gynecology  Hx of uterine prolapse -     Ambulatory referral to Obstetrics / Gynecology  Cervical cancer screening -     Ambulatory referral to Obstetrics / Gynecology  Class 1 obesity due to excess calories with serious comorbidity and body mass index (BMI) of 34.0 to 34.9 in adult She will return for fasting labs.  -     CBC with Differential/Platelet; Future -     CMP14+EGFR; Future -     Lipid panel; Future -     TSH; Future  Gastroesophageal reflux disease, unspecified whether esophagitis present Well controlled on current regimen.  -     pantoprazole (PROTONIX) 20 MG tablet; Take 1 tablet (20 mg total) by mouth daily.  Shifting sleep-work schedule Will check vitamin D.  -     VITAMIN D 25 Hydroxy (Vit-D Deficiency, Fractures); Future  Bipolar affective disorder, current episode depressed, current episode severity unspecified (HCC) Attention deficit hyperactivity disorder (ADHD), unspecified ADHD type Managed by [facility]. Denies SI.    Immunization History  Administered Date(s) Administered   Influenza,inj,Quad PF,6+ Mos 11/14/2017, 06/20/2019   Influenza-Unspecified 08/10/2015, 11/14/2017, 09/03/2018, 06/20/2019   Moderna Sars-Covid-2 Vaccination 01/28/2020, 03/02/2020   Tdap 08/10/2015    Health Maintenance  Topic Date Due   INFLUENZA VACCINE  02/03/2024 (Originally 06/06/2023)   COVID-19 Vaccine (3 - 2024-25 season) 07/27/2024 (Originally 07/07/2023)   Cervical Cancer  Screening (HPV/Pap Cotest)  01/08/2025 (Originally 10/13/2019)   Hepatitis C Screening  01/08/2025 (Originally 10/13/2007)   DTaP/Tdap/Td (2 - Td or Tdap) 08/09/2025   HIV Screening  Completed   HPV VACCINES  Aged Out    Discussed health benefits of physical activity, and encouraged her to engage in regular exercise appropriate for her age and condition.  Problem List Items Addressed This Visit       Digestive   Gastroesophageal reflux disease   Relevant Medications   pantoprazole (PROTONIX) 20 MG tablet     Other   Bipolar disorder, unspecified (HCC)   Attention deficit hyperactivity disorder (ADHD)   Class 1 obesity due to excess calories with serious comorbidity and body mass index (BMI) of 34.0 to 34.9 in adult   Relevant Medications   amphetamine-dextroamphetamine (ADDERALL XR) 15 MG 24 hr capsule   Other Relevant Orders   CBC with Differential/Platelet   CMP14+EGFR   Lipid panel   TSH   Other Visit Diagnoses       Routine general medical examination at a health care facility    -  Primary     Mixed urge and stress incontinence       Relevant Orders   Ambulatory referral to Obstetrics / Gynecology     Hx of uterine prolapse       Relevant Orders   Ambulatory referral to Obstetrics / Gynecology     Cervical cancer screening  Relevant Orders   Ambulatory referral to Obstetrics / Gynecology     Shifting sleep-work schedule       Relevant Orders   VITAMIN D 25 Hydroxy (Vit-D Deficiency, Fractures)      Return in about 1 year (around 01/08/2025) for CPE.   The patient indicates understanding of these issues and agrees with the plan.  Gabriel Earing, FNP

## 2024-01-09 NOTE — Patient Instructions (Signed)

## 2024-01-13 ENCOUNTER — Other Ambulatory Visit

## 2024-01-16 ENCOUNTER — Other Ambulatory Visit

## 2024-01-16 DIAGNOSIS — E66811 Obesity, class 1: Secondary | ICD-10-CM

## 2024-01-16 DIAGNOSIS — G4726 Circadian rhythm sleep disorder, shift work type: Secondary | ICD-10-CM

## 2024-01-17 ENCOUNTER — Other Ambulatory Visit: Payer: Self-pay | Admitting: Family Medicine

## 2024-01-17 ENCOUNTER — Encounter: Payer: Self-pay | Admitting: Family Medicine

## 2024-01-17 LAB — CMP14+EGFR
ALT: 18 IU/L (ref 0–32)
AST: 17 IU/L (ref 0–40)
Albumin: 3.9 g/dL (ref 3.9–4.9)
Alkaline Phosphatase: 87 IU/L (ref 44–121)
BUN/Creatinine Ratio: 10 (ref 9–23)
BUN: 8 mg/dL (ref 6–20)
Bilirubin Total: 0.4 mg/dL (ref 0.0–1.2)
CO2: 23 mmol/L (ref 20–29)
Calcium: 9 mg/dL (ref 8.7–10.2)
Chloride: 106 mmol/L (ref 96–106)
Creatinine, Ser: 0.79 mg/dL (ref 0.57–1.00)
Globulin, Total: 2.2 g/dL (ref 1.5–4.5)
Glucose: 78 mg/dL (ref 70–99)
Potassium: 4.3 mmol/L (ref 3.5–5.2)
Sodium: 141 mmol/L (ref 134–144)
Total Protein: 6.1 g/dL (ref 6.0–8.5)
eGFR: 101 mL/min/{1.73_m2} (ref >59)

## 2024-01-17 LAB — CBC WITH DIFFERENTIAL/PLATELET
Basophils Absolute: 0.1 10*3/uL (ref 0.0–0.2)
Basos: 1 %
EOS (ABSOLUTE): 0.5 10*3/uL — ABNORMAL HIGH (ref 0.0–0.4)
Eos: 6 %
Hematocrit: 40.3 % (ref 34.0–46.6)
Hemoglobin: 13.4 g/dL (ref 11.1–15.9)
Immature Grans (Abs): 0 10*3/uL (ref 0.0–0.1)
Immature Granulocytes: 0 %
Lymphocytes Absolute: 2.2 10*3/uL (ref 0.7–3.1)
Lymphs: 26 %
MCH: 29.5 pg (ref 26.6–33.0)
MCHC: 33.3 g/dL (ref 31.5–35.7)
MCV: 89 fL (ref 79–97)
Monocytes Absolute: 0.5 10*3/uL (ref 0.1–0.9)
Monocytes: 6 %
Neutrophils Absolute: 5.1 10*3/uL (ref 1.4–7.0)
Neutrophils: 61 %
Platelets: 306 10*3/uL (ref 150–450)
RBC: 4.55 x10E6/uL (ref 3.77–5.28)
RDW: 13.1 % (ref 11.7–15.4)
WBC: 8.3 10*3/uL (ref 3.4–10.8)

## 2024-01-17 LAB — LIPID PANEL
Chol/HDL Ratio: 2.7 ratio (ref 0.0–4.4)
Cholesterol, Total: 145 mg/dL (ref 100–199)
HDL: 53 mg/dL (ref >39)
LDL Chol Calc (NIH): 80 mg/dL (ref 0–99)
Triglycerides: 59 mg/dL (ref 0–149)
VLDL Cholesterol Cal: 12 mg/dL (ref 5–40)

## 2024-01-17 LAB — TSH: TSH: 1.03 u[IU]/mL (ref 0.450–4.500)

## 2024-01-17 LAB — VITAMIN D 25 HYDROXY (VIT D DEFICIENCY, FRACTURES): Vit D, 25-Hydroxy: 14.5 ng/mL — ABNORMAL LOW (ref 30.0–100.0)

## 2024-01-17 MED ORDER — VITAMIN D (ERGOCALCIFEROL) 1.25 MG (50000 UNIT) PO CAPS: 50000.0000 [IU] | ORAL_CAPSULE | ORAL | 0 refills | Status: AC

## 2024-01-17 NOTE — Progress Notes (Signed)
 Vitamin D level is low. I have sent in a weekly supplement to take for the next 12 weeks. After that, take a daily OTC vitamin D supplement with 1000-2000 IU. Slight variation in EOS, not concerning at this time.

## 2024-01-20 ENCOUNTER — Ambulatory Visit: Payer: Self-pay | Admitting: Family Medicine

## 2024-01-20 NOTE — Progress Notes (Unsigned)
   Acute Office Visit  Subjective:     Patient ID: Katie Briggs, female    DOB: 04-10-1991, 33 y.o.   MRN: [id number]  No chief complaint on file.   HPI  ROS Negative unless indicated in HPI    Objective:    There were no vitals taken for this visit. {Vitals History (Optional):23777}  Physical Exam  No results found for any visits on 01/21/24.      Assessment & Plan:  There are no diagnoses linked to this encounter.  No follow-ups on file.  @Floreine Kingdon  Janee Morn DNP@  Note: This document was prepared by [employer] voice dictation technology and any errors that results from this process are unintentional.

## 2024-01-20 NOTE — Telephone Encounter (Signed)
 Copied from CRM [id number]. Topic: Clinical - Red Word Triage >> Jan 20, 2024  2:55 PM Turkey B wrote: [facility] that prompted transfer to Nurse Triage: pt called in says has bruise on her stomach that's swollen. She says she has soft stool ,not always necessarily diarrhea.   Chief Complaint: swollen bruise that is painful purple in color Symptoms: loose stools Frequency: this am  Pertinent Negatives: Patient denies hitting her abdomen on anything Disposition: [] ED /[] Urgent Care (no appt availability in office) / [x] Appointment(In office/virtual)/ []  [place] [facility]/ [] Home Care/ [] Refused Recommended Disposition /[] [place] [facility]/ []  Follow-up with PCP Additional Notes:   Reason for Disposition  [1] Not caused by an injury AND [2] < 5 unexplained bruises  Answer Assessment - Initial Assessment Questions 1. APPEARANCE of BRUISE: "Describe the bruise."      Purple  2. SIZE: "How large is the bruise?"      Size of nickle 3. NUMBER: "How many bruises are there?"      1 4. LOCATION: "Where is the bruise located?"      Half way from breast to navel  5. ONSET: "How long ago did the bruise occur?"      This am  6. CAUSE: "Tell me how it happened."     Unsure  7. MEDICAL HISTORY: "Do you have any medical problems that can cause easy bruising or bleeding?" (e.g., leukemia, liver disease, recent chemotherapy)     no 8. MEDICINES: "Do you take any medications which thin the blood such as: aspirin, heparin, ibuprofen (NSAIDS), Plavix, or Coumadin?"     N/a 9. OTHER SYMPTOMS: "Do you have any other symptoms?"  (e.g., weakness, dizziness, pain, fever, nosebleed, blood in urine/stool)     Tender to touch and swollen abosulte eosinophils , 3 soft stool, gas 3 weeks  Protocols used: Bruises-A-AH

## 2024-01-21 ENCOUNTER — Ambulatory Visit (INDEPENDENT_AMBULATORY_CARE_PROVIDER_SITE_OTHER): Admitting: Nurse Practitioner

## 2024-01-21 ENCOUNTER — Encounter: Payer: Self-pay | Admitting: Nurse Practitioner

## 2024-01-21 VITALS — BP 101/66 | HR 80 | Temp 97.4°F | Ht 61.0 in | Wt 182.2 lb

## 2024-01-21 DIAGNOSIS — Z23 Encounter for immunization: Secondary | ICD-10-CM | POA: Diagnosis not present

## 2024-01-21 DIAGNOSIS — T148XXA Other injury of unspecified body region, initial encounter: Secondary | ICD-10-CM | POA: Diagnosis not present

## 2024-01-21 DIAGNOSIS — R197 Diarrhea, unspecified: Secondary | ICD-10-CM

## 2024-01-23 DIAGNOSIS — Z23 Encounter for immunization: Secondary | ICD-10-CM | POA: Insufficient documentation

## 2024-01-23 DIAGNOSIS — T148XXA Other injury of unspecified body region, initial encounter: Secondary | ICD-10-CM | POA: Insufficient documentation

## 2024-01-23 DIAGNOSIS — R197 Diarrhea, unspecified: Secondary | ICD-10-CM | POA: Insufficient documentation

## 2024-02-03 NOTE — Progress Notes (Unsigned)
   NEW GYNECOLOGY VISIT  Subjective:  Katie Briggs is a 33 y.o. [phone] with LMP *** presenting for ***   Menstrual Hx:  PMH: PSH: Meds: All: OB: Pap Hx:  Soc: FHx:  Objective:  There were no vitals filed for this visit.  General:  Alert, oriented and cooperative. Patient is in no acute distress.  Skin: Skin is warm and dry. No rash noted.   Cardiovascular: Normal heart rate noted  Respiratory: Normal respiratory effort, no problems with respiration noted  Abdomen: Soft, non-tender, non-distended   Pelvic: NEFG.   Exam performed in the presence of a chaperone  Assessment and Plan:  Katie Briggs is a 33 y.o. with ***  There are no diagnoses linked to this encounter.  No follow-ups on file.  Future Appointments  Date Time Provider Department Center  02/05/2024  9:10 AM Lennart Pall, MD [facility]    Lennart Pall, MD

## 2024-02-05 ENCOUNTER — Other Ambulatory Visit (HOSPITAL_COMMUNITY)
Admission: RE | Admit: 2024-02-05 | Discharge: 2024-02-05 | Disposition: A | Discharge status: Home / Self Care | Source: Ambulatory Visit | Attending: Obstetrics and Gynecology | Admitting: Obstetrics and Gynecology

## 2024-02-05 ENCOUNTER — Ambulatory Visit (INDEPENDENT_AMBULATORY_CARE_PROVIDER_SITE_OTHER): Admitting: Obstetrics and Gynecology

## 2024-02-05 ENCOUNTER — Encounter: Payer: Self-pay | Admitting: Obstetrics and Gynecology

## 2024-02-05 VITALS — BP 100/70 | HR 74 | Ht 61.0 in | Wt 184.0 lb

## 2024-02-05 DIAGNOSIS — Z01419 Encounter for gynecological examination (general) (routine) without abnormal findings: Secondary | ICD-10-CM | POA: Insufficient documentation

## 2024-02-05 DIAGNOSIS — N393 Stress incontinence (female) (male): Secondary | ICD-10-CM

## 2024-02-05 NOTE — Patient Instructions (Signed)
 It was nice meeting you today. You will see your results in the MyChart app within 1 week.

## 2024-02-06 LAB — CYTOLOGY - PAP
Chlamydia: NEGATIVE
Comment: NEGATIVE
Comment: NEGATIVE
Comment: NEGATIVE
Comment: NORMAL
Diagnosis: NEGATIVE
High risk HPV: NEGATIVE
Neisseria Gonorrhea: NEGATIVE
Trichomonas: NEGATIVE

## 2024-02-07 ENCOUNTER — Encounter: Payer: Self-pay | Admitting: Obstetrics and Gynecology

## 2024-04-02 NOTE — Therapy (Deleted)
 OUTPATIENT PHYSICAL THERAPY FEMALE PELVIC EVALUATION   Patient Name: Katie Briggs MRN: [id number] DOB:Sep 21, 1991, 33 y.o., female Today's Date: 04/02/2024  END OF SESSION:   Past Medical History:  Diagnosis Date   Anemia    Anxiety    Asthma    Bipolar disorder (HCC)    Cervix prolapsed into vagina    Depression    Former smoker    Itching in the vaginal area 01/09/2016   OCD (obsessive compulsive disorder)    Supervision of normal pregnancy in second trimester 05/11/2015    [facility] at   05/11/15 FOB Albertine Alpha 33 yo Dating By  LMP and US  Pap  05/11/15 GC/CT Initial:                36+wks: Genetic Screen NT/IT:  CF screen  Anatomic US   Flu vaccine  Tdap Recommended ~ 28wks Glucose Screen  2 hr GBS  Feed Preference  Contraception  Circumcision  Childbirth Classes  Pediatrician     Vaginal discharge 01/09/2016   Yeast infection 01/09/2016   Past Surgical History:  Procedure Laterality Date   COLONOSCOPY N/A 11/12/2013   Procedure: COLONOSCOPY;  Surgeon: Suzette Espy, MD;  Location: [facility];  Service: Endoscopy;  Laterality: N/A;  2:40   DILATION AND CURETTAGE OF UTERUS     LAPAROSCOPIC BILATERAL SALPINGECTOMY Bilateral 01/25/2016   Procedure: LAPAROSCOPIC BILATERAL SALPINGECTOMY;  Surgeon: Wendelyn Halter, MD;  Location: [facility];  Service: Gynecology;  Laterality: Bilateral;   None     TUBAL LIGATION     salpingectomy-tubes removed   Patient Active Problem List   Diagnosis Date Noted   Encounter for immunization 01/23/2024   Diarrhea 01/23/2024   Bruise 01/23/2024   OSA (obstructive sleep apnea) 09/13/2022   Class 1 obesity due to excess calories with serious comorbidity and body mass index (BMI) of 34.0 to 34.9 in adult 09/13/2022   Gastroesophageal reflux disease 09/13/2022   Borderline personality disorder (HCC) 08/02/2022   Anxiety 08/02/2022   Attention deficit hyperactivity disorder (ADHD) 08/02/2022   Obesity (BMI 30.0-34.9) 06/28/2020    Severe episode of recurrent major depressive disorder, without psychotic features (HCC) 06/23/2020   Bipolar disorder, unspecified (HCC) 06/13/2020   Panic attacks 03/25/2019   Obsessive compulsive disorder 03/10/2012    PCP: Albertha Huger, FNP  REFERRING PROVIDER: Izell Marsh, MD   REFERRING DIAG: N39.3 (ICD-10-CM) - SUI (stress urinary incontinence, female)   THERAPY DIAG:  No diagnosis found.  Rationale for Evaluation and Treatment: Rehabilitation  ONSET DATE: ***  SUBJECTIVE:  SUBJECTIVE STATEMENT: Pelvic floor PT for stress urinary incontinence, pelvic organ prolapse .Notes longstanding history of prolapse since the birth of her children. She notices it at the end of the day when she gets a bulge sensation. She also has urinary leakage most notably with cough/laugh/sneeze, lifting heavy objects or if she waits too long to use the restroom. Wears poise pads. Has not tried medications, PFPT, or pessary for these symptoms.   Fluid intake:   PAIN:  Are you having pain? {yes/no:20286} NPRS scale: ***/10 Pain location: {pelvic pain location:27098}  Pain type: {type:313116} Pain description: {PAIN DESCRIPTION:21022940}   Aggravating factors: *** Relieving factors: ***  PRECAUTIONS: {Therapy precautions:24002}  RED FLAGS: {PT Red Flags:29287}   WEIGHT BEARING RESTRICTIONS: No  FALLS:  Has patient fallen in last 6 months? {fallsyesno:27318}  OCCUPATION: ***  ACTIVITY LEVEL : ***  PLOF: {PLOF:24004}  PATIENT GOALS: ***  PERTINENT HISTORY:  Laparoscopic bilateral salpingectomy 01/25/16; Bipolar; OCD;  Sexual abuse: {Yes/No:304960894}  BOWEL MOVEMENT: Pain with bowel movement: {yes/no:20286} Type of bowel movement:{PT BM type:27100} Fully empty rectum:  {No/Yes:304960894} Leakage: {Yes/No:304960894} Pads: {Yes/No:304960894} Fiber supplement/laxative {YES/NO AS:20300}  URINATION: Pain with urination: {yes/no:20286} Fully empty bladder: {Yes/No:[id number]}*** Stream: {PT urination:27102} Urgency: {YES/NO AS:20300} Frequency: *** Leakage: {PT leakage:27103} Pads: {Yes/No:304960894}  INTERCOURSE:  Ability to have vaginal penetration {YES/NO:21197} Pain with intercourse: {pain with intercourse PA:27099} Dryness{YES/NO AS:20300} Climax: *** Marinoff Scale: ***/3 Laxative:  PREGNANCY: Vaginal deliveries *** Tearing {Yes***/No:304960894} Episiotomy {YES/NO AS:20300} C-section deliveries *** Currently pregnant {Yes***/No:[id number]}  PROLAPSE: {PT prolapse:27101}   OBJECTIVE:  Note: Objective measures were completed at Evaluation unless otherwise noted.  DIAGNOSTIC FINDINGS:  ***  PATIENT SURVEYS:  {rehab surveys:24030}  PFIQ-7: ***  COGNITION: Overall cognitive status: {cognition:24006}     SENSATION: Light touch: {intact/deficits:24005}  LUMBAR SPECIAL TESTS:  {lumbar special test:25242}  FUNCTIONAL TESTS:  {Functional tests:24029}  GAIT: Assistive device utilized: {Assistive devices:23999} Comments: ***  POSTURE: {posture:25561}   LUMBARAROM/PROM:  A/PROM A/PROM  eval  Flexion   Extension   Right lateral flexion   Left lateral flexion   Right rotation   Left rotation    (Blank rows = not tested)  LOWER EXTREMITY ROM:  {AROM/PROM:27142} ROM Right eval Left eval  Hip flexion    Hip extension    Hip abduction    Hip adduction    Hip internal rotation    Hip external rotation    Knee flexion    Knee extension    Ankle dorsiflexion    Ankle plantarflexion    Ankle inversion    Ankle eversion     (Blank rows = not tested)  LOWER EXTREMITY MMT:  MMT Right eval Left eval  Hip flexion    Hip extension    Hip abduction    Hip adduction    Hip internal rotation    Hip external  rotation    Knee flexion    Knee extension    Ankle dorsiflexion    Ankle plantarflexion    Ankle inversion    Ankle eversion     (Blank rows = not tested) PALPATION:   General: ***  Pelvic Alignment: ***  Abdominal: ***                External Perineal Exam: ***                             Internal Pelvic Floor: ***  Patient confirms identification and approves PT to assess internal  pelvic floor and treatment {yes/no:20286}  PELVIC MMT:   MMT eval  Vaginal   Internal Anal Sphincter   External Anal Sphincter   Puborectalis   Diastasis Recti   (Blank rows = not tested)        TONE: ***  PROLAPSE: ***  TODAY'S TREATMENT:                                                                                                                              DATE: ***  EVAL ***   PATIENT EDUCATION:  Education details: *** Person educated: {Person educated:25204} Education method: {Education Method:25205} Education comprehension: {Education Comprehension:25206}  HOME EXERCISE PROGRAM: ***  ASSESSMENT:  CLINICAL IMPRESSION: Patient is a *** y.o. *** who was seen today for physical therapy evaluation and treatment for ***.   OBJECTIVE IMPAIRMENTS: {opptimpairments:25111}.   ACTIVITY LIMITATIONS: {activitylimitations:27494}  PARTICIPATION LIMITATIONS: {participationrestrictions:25113}  PERSONAL FACTORS: {Personal factors:25162} are also affecting patient's functional outcome.   REHAB POTENTIAL: {rehabpotential:25112}  CLINICAL DECISION MAKING: {clinical decision making:25114}  EVALUATION COMPLEXITY: {Evaluation complexity:25115}   GOALS: Goals reviewed with patient? {yes/no:20286}  SHORT TERM GOALS: Target date: ***  *** Baseline: Goal status: INITIAL  2.  *** Baseline:  Goal status: INITIAL  3.  *** Baseline:  Goal status: INITIAL  4.  *** Baseline:  Goal status: INITIAL  5.  *** Baseline:  Goal status: INITIAL  6.  *** Baseline:  Goal  status: INITIAL  LONG TERM GOALS: Target date: ***  *** Baseline:  Goal status: INITIAL  2.  *** Baseline:  Goal status: INITIAL  3.  *** Baseline:  Goal status: INITIAL  4.  *** Baseline:  Goal status: INITIAL  5.  *** Baseline:  Goal status: INITIAL  6.  *** Baseline:  Goal status: INITIAL  PLAN:  PT FREQUENCY: {rehab frequency:25116}  PT DURATION: {rehab duration:25117}  PLANNED INTERVENTIONS: {rehab planned interventions:25118::"97110-Therapeutic exercises","97530- Therapeutic [phone]- Neuromuscular re-education","97535- Self HQIO","96295- Manual therapy"}  PLAN FOR NEXT SESSION: ***   Ola Raap, PT 04/02/2024, 8:59 AM

## 2024-04-03 ENCOUNTER — Ambulatory Visit: Attending: Obstetrics and Gynecology | Admitting: Physical Therapy

## 2024-08-17 ENCOUNTER — Encounter: Payer: Self-pay | Admitting: Family Medicine

## 2024-08-17 ENCOUNTER — Ambulatory Visit (INDEPENDENT_AMBULATORY_CARE_PROVIDER_SITE_OTHER): Admitting: Family Medicine

## 2024-08-17 VITALS — BP 111/64 | HR 78 | Temp 98.2°F | Ht 61.0 in | Wt 190.2 lb

## 2024-08-17 DIAGNOSIS — F3130 Bipolar disorder, current episode depressed, mild or moderate severity, unspecified: Secondary | ICD-10-CM

## 2024-08-17 DIAGNOSIS — Z23 Encounter for immunization: Secondary | ICD-10-CM | POA: Diagnosis not present

## 2024-08-17 DIAGNOSIS — K219 Gastro-esophageal reflux disease without esophagitis: Secondary | ICD-10-CM

## 2024-08-17 MED ORDER — ARIPIPRAZOLE 2 MG PO TABS: 4.0000 mg | ORAL_TABLET | Freq: Every day | ORAL | 0 refills | Status: DC

## 2024-08-17 MED ORDER — LAMOTRIGINE 200 MG PO TABS: 200.0000 mg | ORAL_TABLET | Freq: Every day | ORAL | 0 refills | Status: AC

## 2024-08-17 MED ORDER — QUETIAPINE FUMARATE 50 MG PO TABS: 50.0000 mg | ORAL_TABLET | Freq: Every day | ORAL | 0 refills | Status: AC

## 2024-08-17 NOTE — Progress Notes (Signed)
 Established Patient Office Visit  Subjective   Patient ID: Katie Briggs, female    DOB: 27-Dec-1990  Age: 33 y.o. MRN: [id number]  Chief Complaint  Patient presents with   Medical Management of Chronic Issues    HPI  History of Present Illness   Katie Briggs is a 33 year old female with bipolar disorder who presents for medication management.  Mood stabilization and psychiatric medication management - Diagnosed with bipolar disorder - Currently prescribed Seroquel, Abilify , and Lamictal for mood stabilization - Between psychiatrists due to suspension of previous psychiatrist - Has not yet run out of psychiatric medications and is seeking to ensure continuity of medication supply - Plans to use [facility] for medication management due to a busy schedule with work and school - Emphasizes that she has everything under control and only requires medication management at this time - She was previously on adderall and ativan for anxiety and ADHD. She has been out of her controlled medications for about 2 months.   Gastroesophageal reflux symptoms - Experiences occasional acid reflux - Symptoms include intermittent trouble swallowing and rare episodes of regurgitation at nighttime - Symptoms are less severe than previously - Has refills available for acid reflux medication        08/17/2024    9:54 AM 01/09/2024   10:52 AM 07/12/2023    1:04 PM  Depression screen PHQ 2/9  Decreased Interest 1 0 1  Down, Depressed, Hopeless 1 0 0  PHQ - 2 Score 2 0 1  Altered sleeping 2 1 1   Tired, decreased energy 2 1 1   Change in appetite 2 1 2   Feeling bad or failure about yourself  1 0 0  Trouble concentrating 1 3 1   Moving slowly or fidgety/restless 0 0 1  Suicidal thoughts 0 0 0  PHQ-9 Score 10 6 7   Difficult doing work/chores Somewhat difficult Somewhat difficult Somewhat difficult      08/17/2024    9:54 AM 01/09/2024   10:52 AM 07/12/2023    1:02 PM 05/27/2023    4:15 PM  GAD  7 : Generalized Anxiety Score  Nervous, Anxious, on Edge 1 1 1 1   Control/stop worrying 1 0 0 1  Worry too much - different things 1 1 0 1  Trouble relaxing 1 2 1 1   Restless 1 2 0 1  Easily annoyed or irritable 1 1 0 2  Afraid - awful might happen 1 2 0 0  Total GAD 7 Score 7 9 2 7   Anxiety Difficulty Somewhat difficult Somewhat difficult Somewhat difficult Somewhat difficult       ROS As per HPI.    Objective:     BP 111/64   Pulse 78   Temp 98.2 F (36.8 C) (Temporal)   Ht 5' 1 (1.549 m)   Wt 190 lb 3.2 oz (86.3 kg)   SpO2 96%   BMI 35.94 kg/m    Physical Exam Vitals and nursing note reviewed.  Constitutional:      General: She is not in acute distress.    Appearance: She is obese. She is not ill-appearing, toxic-appearing or diaphoretic.  Cardiovascular:     Rate and Rhythm: Normal rate and regular rhythm.     Pulses: Normal pulses.     Heart sounds: Normal heart sounds. No murmur heard. Pulmonary:     Effort: Pulmonary effort is normal. No respiratory distress.     Breath sounds: Normal breath sounds.  Musculoskeletal:  Right lower leg: No edema.     Left lower leg: No edema.  Skin:    General: Skin is warm and dry.  Neurological:     General: No focal deficit present.     Mental Status: She is alert and oriented to person, place, and time.  Psychiatric:        Mood and Affect: Mood normal.        Behavior: Behavior normal.      No results found for any visits on 08/17/24.    The ASCVD Risk score (Arnett DK, et al., 2019) failed to calculate for the following reasons:   The 2019 ASCVD risk score is only valid for ages 70 to 12    Assessment & Plan:   Katie Briggs was seen today for medical management of chronic issues.  Diagnoses and all orders for this visit:  Bipolar affective disorder, current episode depressed, current episode severity unspecified (HCC) -     lamoTRIgine (LAMICTAL) 200 MG tablet; Take 1 tablet (200 mg total) by mouth  daily. -     QUEtiapine (SEROQUEL) 50 MG tablet; Take 1 tablet (50 mg total) by mouth at bedtime. -     ARIPiprazole  (ABILIFY ) 2 MG tablet; Take 2 tablets (4 mg total) by mouth daily.  Gastroesophageal reflux disease, unspecified whether esophagitis present  Encounter for immunization -     Flu vaccine trivalent PF, 6mos and older(Flulaval,Afluria,Fluarix,Fluzone)      Bipolar affective disorder Between psychiatric care; requires medication management. Reports condition control, needs refills. - Prescribe 90-day supply of Seroquel, Abilify , and Lamictal to bridge until set up with new psychiatrist - Send prescriptions to [facility]. - Advise establishing care with new psychiatrist within three months. - Offer referral if needed.  Gastroesophageal reflux disease (GERD) Occasional symptoms with PPI.  - Continue PPI.      Return in about 21 weeks (around 01/11/2025) for CPE.   The patient indicates understanding of these issues and agrees with the plan.  Katie CHRISTELLA Search, FNP

## 2024-09-23 ENCOUNTER — Other Ambulatory Visit: Payer: Self-pay | Admitting: Family Medicine

## 2024-09-23 DIAGNOSIS — F3130 Bipolar disorder, current episode depressed, mild or moderate severity, unspecified: Secondary | ICD-10-CM

## 2025-01-15 ENCOUNTER — Encounter: Payer: Self-pay | Admitting: Family Medicine
